# Patient Record
Sex: Female | Born: 1935 | Race: White | Hispanic: No | Marital: Married | Smoking: Former smoker
Health system: Southern US, Community
[De-identification: ages and names within clinical notes are randomized; demographics above are authoritative.]

## PROBLEM LIST (undated history)

## (undated) DIAGNOSIS — K5790 Diverticulosis of intestine, part unspecified, without perforation or abscess without bleeding: Secondary | ICD-10-CM

## (undated) DIAGNOSIS — R51 Headache: Secondary | ICD-10-CM

## (undated) DIAGNOSIS — K209 Esophagitis, unspecified without bleeding: Secondary | ICD-10-CM

## (undated) DIAGNOSIS — M1990 Unspecified osteoarthritis, unspecified site: Secondary | ICD-10-CM

## (undated) DIAGNOSIS — K219 Gastro-esophageal reflux disease without esophagitis: Secondary | ICD-10-CM

## (undated) DIAGNOSIS — H1851 Endothelial corneal dystrophy: Secondary | ICD-10-CM

## (undated) DIAGNOSIS — R911 Solitary pulmonary nodule: Secondary | ICD-10-CM

## (undated) DIAGNOSIS — C801 Malignant (primary) neoplasm, unspecified: Secondary | ICD-10-CM

## (undated) DIAGNOSIS — T7840XA Allergy, unspecified, initial encounter: Secondary | ICD-10-CM

## (undated) DIAGNOSIS — I2510 Atherosclerotic heart disease of native coronary artery without angina pectoris: Secondary | ICD-10-CM

## (undated) DIAGNOSIS — I10 Essential (primary) hypertension: Secondary | ICD-10-CM

## (undated) DIAGNOSIS — D124 Benign neoplasm of descending colon: Secondary | ICD-10-CM

## (undated) DIAGNOSIS — M8580 Other specified disorders of bone density and structure, unspecified site: Secondary | ICD-10-CM

## (undated) DIAGNOSIS — E041 Nontoxic single thyroid nodule: Secondary | ICD-10-CM

## (undated) DIAGNOSIS — H269 Unspecified cataract: Secondary | ICD-10-CM

## (undated) DIAGNOSIS — E785 Hyperlipidemia, unspecified: Secondary | ICD-10-CM

## (undated) DIAGNOSIS — K2970 Gastritis, unspecified, without bleeding: Secondary | ICD-10-CM

## (undated) DIAGNOSIS — D649 Anemia, unspecified: Secondary | ICD-10-CM

## (undated) HISTORY — DX: Atherosclerotic heart disease of native coronary artery without angina pectoris: I25.10

## (undated) HISTORY — DX: Benign neoplasm of descending colon: D12.4

## (undated) HISTORY — DX: Diverticulosis of intestine, part unspecified, without perforation or abscess without bleeding: K57.90

## (undated) HISTORY — DX: Esophagitis, unspecified without bleeding: K20.90

## (undated) HISTORY — DX: Gastritis, unspecified, without bleeding: K29.70

## (undated) HISTORY — DX: Unspecified osteoarthritis, unspecified site: M19.90

## (undated) HISTORY — DX: Essential (primary) hypertension: I10

## (undated) HISTORY — DX: Nontoxic single thyroid nodule: E04.1

## (undated) HISTORY — DX: Other specified disorders of bone density and structure, unspecified site: M85.80

## (undated) HISTORY — DX: Solitary pulmonary nodule: R91.1

## (undated) HISTORY — DX: Unspecified cataract: H26.9

## (undated) HISTORY — DX: Esophagitis, unspecified: K20.9

## (undated) HISTORY — DX: Gastro-esophageal reflux disease without esophagitis: K21.9

## (undated) HISTORY — DX: Malignant (primary) neoplasm, unspecified: C80.1

## (undated) HISTORY — DX: Endothelial corneal dystrophy: H18.51

## (undated) HISTORY — DX: Hyperlipidemia, unspecified: E78.5

## (undated) HISTORY — PX: MENISCUS REPAIR: SHX5179

## (undated) HISTORY — DX: Anemia, unspecified: D64.9

## (undated) HISTORY — PX: COLONOSCOPY: SHX174

## (undated) HISTORY — DX: Allergy, unspecified, initial encounter: T78.40XA

---

## 1995-04-02 HISTORY — PX: FACIAL COSMETIC SURGERY: SHX629

## 1996-12-31 HISTORY — PX: OTHER SURGICAL HISTORY: SHX169

## 1999-06-07 ENCOUNTER — Ambulatory Visit (HOSPITAL_COMMUNITY): Admission: RE | Admit: 1999-06-07 | Discharge: 1999-06-07 | Payer: Self-pay | Admitting: Cardiology

## 1999-06-07 ENCOUNTER — Encounter: Payer: Self-pay | Admitting: Cardiology

## 1999-08-03 DIAGNOSIS — K5790 Diverticulosis of intestine, part unspecified, without perforation or abscess without bleeding: Secondary | ICD-10-CM

## 1999-08-03 DIAGNOSIS — K219 Gastro-esophageal reflux disease without esophagitis: Secondary | ICD-10-CM

## 1999-08-03 DIAGNOSIS — K2970 Gastritis, unspecified, without bleeding: Secondary | ICD-10-CM

## 1999-08-03 HISTORY — DX: Gastritis, unspecified, without bleeding: K29.70

## 1999-08-03 HISTORY — DX: Diverticulosis of intestine, part unspecified, without perforation or abscess without bleeding: K57.90

## 1999-08-03 HISTORY — DX: Gastro-esophageal reflux disease without esophagitis: K21.9

## 1999-09-01 ENCOUNTER — Encounter: Payer: Self-pay | Admitting: Gastroenterology

## 1999-09-01 ENCOUNTER — Ambulatory Visit (HOSPITAL_COMMUNITY): Admission: RE | Admit: 1999-09-01 | Discharge: 1999-09-01 | Payer: Self-pay | Admitting: Gastroenterology

## 2002-08-19 ENCOUNTER — Encounter (HOSPITAL_COMMUNITY): Admission: RE | Admit: 2002-08-19 | Discharge: 2002-09-18 | Payer: Self-pay | Admitting: Cardiology

## 2004-09-06 ENCOUNTER — Ambulatory Visit: Payer: Self-pay

## 2004-09-06 ENCOUNTER — Encounter: Payer: Self-pay | Admitting: Cardiology

## 2004-12-26 ENCOUNTER — Ambulatory Visit (HOSPITAL_COMMUNITY): Admission: RE | Admit: 2004-12-26 | Discharge: 2004-12-26 | Payer: Self-pay | Admitting: Family Medicine

## 2005-08-16 ENCOUNTER — Ambulatory Visit: Payer: Self-pay | Admitting: Cardiology

## 2005-08-28 ENCOUNTER — Ambulatory Visit (HOSPITAL_COMMUNITY): Admission: RE | Admit: 2005-08-28 | Discharge: 2005-08-28 | Payer: Self-pay | Admitting: Cardiology

## 2005-11-27 ENCOUNTER — Ambulatory Visit (HOSPITAL_COMMUNITY): Admission: RE | Admit: 2005-11-27 | Discharge: 2005-11-27 | Payer: Self-pay | Admitting: Family Medicine

## 2006-03-02 HISTORY — PX: CT LUNG SCREENING: HXRAD848

## 2006-03-02 HISTORY — PX: OTHER SURGICAL HISTORY: SHX169

## 2006-03-06 ENCOUNTER — Inpatient Hospital Stay (HOSPITAL_COMMUNITY): Admission: AD | Admit: 2006-03-06 | Discharge: 2006-03-08 | Payer: Self-pay | Admitting: Cardiology

## 2006-03-06 ENCOUNTER — Ambulatory Visit: Payer: Self-pay | Admitting: Cardiology

## 2006-04-19 ENCOUNTER — Ambulatory Visit: Payer: Self-pay | Admitting: Cardiology

## 2006-04-23 ENCOUNTER — Ambulatory Visit (HOSPITAL_COMMUNITY): Admission: RE | Admit: 2006-04-23 | Discharge: 2006-04-23 | Payer: Self-pay | Admitting: Family Medicine

## 2006-06-06 ENCOUNTER — Ambulatory Visit: Payer: Self-pay | Admitting: *Deleted

## 2007-01-30 ENCOUNTER — Ambulatory Visit (HOSPITAL_COMMUNITY): Admission: RE | Admit: 2007-01-30 | Discharge: 2007-01-30 | Payer: Self-pay | Admitting: Family Medicine

## 2007-04-03 ENCOUNTER — Ambulatory Visit: Payer: Self-pay | Admitting: Cardiology

## 2007-05-03 DIAGNOSIS — E041 Nontoxic single thyroid nodule: Secondary | ICD-10-CM

## 2007-05-03 HISTORY — DX: Nontoxic single thyroid nodule: E04.1

## 2007-05-14 ENCOUNTER — Ambulatory Visit (HOSPITAL_COMMUNITY): Admission: RE | Admit: 2007-05-14 | Discharge: 2007-05-14 | Payer: Self-pay | Admitting: Family Medicine

## 2007-06-03 HISTORY — PX: OTHER SURGICAL HISTORY: SHX169

## 2007-07-03 HISTORY — PX: OTHER SURGICAL HISTORY: SHX169

## 2007-10-03 DIAGNOSIS — R911 Solitary pulmonary nodule: Secondary | ICD-10-CM

## 2007-10-03 HISTORY — DX: Solitary pulmonary nodule: R91.1

## 2007-10-07 ENCOUNTER — Ambulatory Visit (HOSPITAL_COMMUNITY): Admission: RE | Admit: 2007-10-07 | Discharge: 2007-10-07 | Payer: Self-pay | Admitting: Family Medicine

## 2008-04-08 ENCOUNTER — Ambulatory Visit: Payer: Self-pay | Admitting: Cardiology

## 2008-04-21 ENCOUNTER — Ambulatory Visit: Payer: Self-pay

## 2009-01-11 DIAGNOSIS — E041 Nontoxic single thyroid nodule: Secondary | ICD-10-CM

## 2009-01-11 DIAGNOSIS — I2510 Atherosclerotic heart disease of native coronary artery without angina pectoris: Secondary | ICD-10-CM

## 2009-01-11 DIAGNOSIS — E785 Hyperlipidemia, unspecified: Secondary | ICD-10-CM

## 2009-01-11 DIAGNOSIS — I10 Essential (primary) hypertension: Secondary | ICD-10-CM | POA: Insufficient documentation

## 2009-01-11 DIAGNOSIS — D649 Anemia, unspecified: Secondary | ICD-10-CM | POA: Insufficient documentation

## 2009-01-11 DIAGNOSIS — I6529 Occlusion and stenosis of unspecified carotid artery: Secondary | ICD-10-CM

## 2009-04-01 DIAGNOSIS — H18519 Endothelial corneal dystrophy, unspecified eye: Secondary | ICD-10-CM

## 2009-04-01 HISTORY — DX: Endothelial corneal dystrophy, unspecified eye: H18.519

## 2009-04-07 ENCOUNTER — Ambulatory Visit: Payer: Self-pay | Admitting: Cardiology

## 2009-06-02 ENCOUNTER — Encounter: Payer: Self-pay | Admitting: Cardiology

## 2009-11-03 ENCOUNTER — Encounter: Payer: Self-pay | Admitting: Cardiology

## 2010-03-22 ENCOUNTER — Encounter: Payer: Self-pay | Admitting: Cardiology

## 2010-05-02 ENCOUNTER — Encounter: Payer: Self-pay | Admitting: Cardiology

## 2010-05-03 ENCOUNTER — Encounter: Payer: Self-pay | Admitting: Cardiology

## 2010-05-03 ENCOUNTER — Ambulatory Visit: Payer: Self-pay

## 2010-05-11 ENCOUNTER — Ambulatory Visit: Payer: Self-pay | Admitting: Cardiology

## 2010-10-23 ENCOUNTER — Encounter: Payer: Self-pay | Admitting: Cardiology

## 2010-10-23 ENCOUNTER — Encounter: Payer: Self-pay | Admitting: Family Medicine

## 2010-11-01 NOTE — Miscellaneous (Signed)
Summary: Orders Update  Clinical Lists Changes  Orders: Added new Test order of Carotid Duplex (Carotid Duplex) - Signed 

## 2010-11-01 NOTE — Miscellaneous (Signed)
Summary: Orders Update  Clinical Lists Changes  Orders: Added new Service order of EKG w/ Interpretation (93000) - Signed Observations: Added new observation of PI CARDIO: Your physician recommends that you schedule a follow-up appointment in: 1 yr in the [place] office Your physician recommends that you continue on your current medications as directed. Please refer to the Current Medication list given to you today. (05/11/2010 11:03)      Patient Instructions: 1)  Your physician recommends that you schedule a follow-up appointment in: 1 yr in the [place] office 2)  Your physician recommends that you continue on your current medications as directed. Please refer to the Current Medication list given to you today.

## 2010-11-01 NOTE — Assessment & Plan Note (Signed)
Summary: [facility]   Visit Type:  Follow-up Primary Provider:  Dr. Vernon Prey  CC:  CAD.  History of Present Illness: The patient presents for yearly followup. Since I last saw her she has had no new cardiovascular problems. She denies any chest pressure, neck or arm discomfort. She has no palpitations, presyncope or syncope. She has no PND or ortho She has no swelling or edema. She ambulates regularly for exercise.  Current Medications (verified): 1)  Lovaza 1 Gm Caps (Omega-3-Acid Ethyl Esters) .... 3 By Mouth Daily 2)  Hydrochlorothiazide 12.5 Mg Caps (Hydrochlorothiazide) .Marland Kitchen.. 1 By Mouth Daily 3)  Protonix 40 Mg Tbec (Pantoprazole Sodium) .Marland Kitchen.. 1 By Mouth Daily 4)  Pravastatin Sodium 40 Mg Tabs (Pravastatin Sodium) .... 2 By Mouth Daily 5)  Welchol 625 Mg Tabs (Colesevelam Hcl) .... 3 By Mouth Daily 6)  Actonel 150 Mg Tabs (Risedronate Sodium) .Marland Kitchen.. 1 By Mouth Monthly 7)  Eql Coq10 200 Mg Caps (Coenzyme Q10) .Marland Kitchen.. 1 By Mouth Daily 8)  Aspirin 81 Mg  Tabs (Aspirin) .Marland Kitchen.. 1 By Mouth Daily 9)  Calcium Citrate 950 Mg Tabs (Calcium Citrate) .... 2 By Mouth Daily 10)  Multivitamins   Tabs (Multiple Vitamin) .Marland Kitchen.. 1 By Mouth Daily 11)  Ra Iron 27 Mg Tabs (Ferrous Sulfate) .Marland Kitchen.. 1 By Mouth Daily 12)  Vitamin D3 1000 Unit Tabs (Cholecalciferol) .... 2 By Mouth Daily  Allergies (verified): 1)  ! Sulfa  Past History:  Past Medical History: Coronary artery disease (80% LAD stenosis, found in 1998, subsequently stented.  Catheterization June 2006 demonstrated normal EF.  The stent was widely patent, there was a 50% proximal LAD and 40% before the stent.  The remainder of her vessels were free of any disease), hypertension, mild carotid stenosis with 39% stenosis on the right and 40- 59% stenosis on the left, dyslipidemic, anemia, thyroid nodules.   Review of Systems       As stated in the HPI and negative for all other systems.   Vital Signs:  Patient profile:   76 year old  female Height:      62 inches Weight:      116 pounds BMI:     21.29 Pulse rate:   68 / minute Resp:     16 per minute BP sitting:   108 / 62  (right arm)  Vitals Entered By: Marrion Coy, CNA (May 11, 2010 10:11 AM)  Physical Exam  General:  Well developed, well nourished, in no acute distress. Head:  normocephalic and atraumatic Eyes:  PERRLA/EOM intact; conjunctiva and lids normal. Mouth:  Teeth, gums and palate normal. Oral mucosa normal. Neck:  Neck supple, no JVD. No masses, thyromegaly or abnormal cervical nodes. Chest Wall:  no deformities or breast masses noted Lungs:  Clear bilaterally to auscultation and percussion. Heart:  Non-displaced PMI, chest non-tender; regular rate and rhythm, S1, S2 without murmurs, rubs or gallops. Carotid upstroke normal, no bruit. Normal abdominal aortic size, no bruits. Femorals normal pulses, no bruits. Pedals normal pulses. No edema, no varicosities. Abdomen:  Bowel sounds positive; abdomen soft and non-tender without masses, organomegaly, or hernias noted. No hepatosplenomegaly. Msk:  Back normal, normal gait. Muscle strength and tone normal. Pulses:  pulses normal in all 4 extremities Extremities:  No clubbing or cyanosis. Neurologic:  Alert and oriented x 3. Skin:  Intact without lesions or rashes. Cervical Nodes:  no significant adenopathy Axillary Nodes:  no significant adenopathy Inguinal Nodes:  no significant adenopathy Psych:  Normal affect.  EKG  Procedure date:  05/11/2010  Findings:      Sinus rhythm, rate 68, low voltage, no acute ST-T wave changes  Impression & Recommendations:  Problem # 1:  CAROTID STENOSIS (ICD-433.10) She had a carotid Doppler a few days ago which demonstrated stable disease less than 60% on the left and less than 40% on the right. She will be reevaluated in 2 years.  Problem # 2:  CAD, UNSPECIFIED SITE (ICD-414.00) She is very active and has absolutely no symptoms. Her last catheterization  was 2007. No further testing is suggested.  Problem # 3:  HYPERTENSION, UNSPECIFIED (ICD-401.9) Her blood pressure is controlled. She will continue the meds as listed.  Problem # 4:  HYPERLIPIDEMIA-MIXED (ICD-272.4) Her lipids are followed and managed expertly by Dr. Christell Constant. I reviewed these and she has an excellent LDL/HDL ratio.

## 2010-12-26 ENCOUNTER — Encounter: Payer: Self-pay | Admitting: Cardiology

## 2010-12-27 ENCOUNTER — Telehealth: Payer: Self-pay | Admitting: Cardiology

## 2010-12-27 NOTE — Telephone Encounter (Signed)
LOV,12 lead faxed to Denise[facility] @ [phone] 12/27/10/KM

## 2010-12-30 ENCOUNTER — Ambulatory Visit (HOSPITAL_BASED_OUTPATIENT_CLINIC_OR_DEPARTMENT_OTHER)
Admission: RE | Admit: 2010-12-30 | Discharge: 2010-12-30 | Disposition: A | Discharge status: Home / Self Care | Payer: Medicare Other | Source: Ambulatory Visit | Attending: Orthopedic Surgery | Admitting: Orthopedic Surgery

## 2010-12-30 DIAGNOSIS — Z01810 Encounter for preprocedural cardiovascular examination: Secondary | ICD-10-CM | POA: Insufficient documentation

## 2010-12-30 DIAGNOSIS — IMO0002 Reserved for concepts with insufficient information to code with codable children: Secondary | ICD-10-CM | POA: Insufficient documentation

## 2010-12-30 DIAGNOSIS — R9431 Abnormal electrocardiogram [ECG] [EKG]: Secondary | ICD-10-CM | POA: Insufficient documentation

## 2010-12-30 DIAGNOSIS — Z01812 Encounter for preprocedural laboratory examination: Secondary | ICD-10-CM | POA: Insufficient documentation

## 2010-12-30 DIAGNOSIS — X58XXXA Exposure to other specified factors, initial encounter: Secondary | ICD-10-CM | POA: Insufficient documentation

## 2010-12-30 LAB — POCT I-STAT 4, (NA,K, GLUC, HGB,HCT)
Glucose, Bld: 79 mg/dL (ref 70–99)
Sodium: 142 mEq/L (ref 135–145)

## 2011-01-04 NOTE — Op Note (Signed)
  [name], [name]                ACCOUNT NO.:  [id number]  MEDICAL RECORD NO.:  [id number]          PATIENT TYPE:  AMB  LOCATION:                               FACILITY:  [facility]  PHYSICIAN:  Ollen Gross, M.D.    DATE OF BIRTH:  04/10/1935  DATE OF PROCEDURE:  12/30/2010 DATE OF DISCHARGE:                              OPERATIVE REPORT   PREOPERATIVE DIAGNOSIS:  Left knee medial meniscal tear.  POSTOPERATIVE DIAGNOSIS:  Left knee medial meniscal tear.  PROCEDURE:  Left knee arthroscopy with meniscal debridement.  SURGEON:  Ollen Gross, MD  ASSISTANT:  None.  ANESTHESIA:  General.  ESTIMATED BLOOD LOSS:  Minimal.  DRAINS:  None.  COMPLICATIONS:  None.  CONDITION:  Stable to Recovery.  BRIEF CLINICAL NOTE:  Ms. Yolanda [name] is a 76 year old female with several- month history of significant left knee medial sided pain with mechanical symptoms.  Exam and history suggested a medial meniscal tear confirmed by MRI.  She presents for arthroscopy and debridement.  PROCEDURE IN DETAIL:  After successful administration of general anesthetic, a tourniquet was placed high on the left thigh and left lower extremity was prepped and draped in the usual sterile fashion. Standard superomedial and inferolateral incisions were made.  Inflow cannula passed.  Superomedial camera passed inferolateral.  Arthroscopic visualization proceeded.  Undersurface of patella and trochlea looked normal.  No evidence of any cartilage defects or chondromalacia.  Medial and lateral gutters were visualized.  There were no loose bodies. Flexion valgus force applied to the knee.  The medial compartment was entered.  She had a significant tear in the body and posterior horn of the medial meniscus, which appeared unstable.  Spinal needle was used to localize the inferomedial portal.  Small incision made and dilator placed.  The meniscus was debrided back to stable base with baskets and a 4.2-mm shaver and then  sealed off with the ArthroCare device.  It was probed and found to be stable.  Chondral surfaces showed minimal chondromalacia.  No evidence of any full-thickness chondral defects. Intercondylar notch was visualized.  The ACL was normal.  Lateral compartment was entered.  It looked normal.  Joints again was visualized and no other tears, loose bodies, or defects were noted.  Arthroscopic equipments were removed from the inferior portals, which were closed with interrupted 4-0 nylon.  20 cc of 0.25% Marcaine with epi injected through the inflow cannula and that was removed and that portal closed with nylon.  Incision was cleaned and dried and a bulky sterile dressing applied.  She was then awakened and transported to Recovery in stable condition.     Ollen Gross, M.D.     FA/MEDQ  D:  12/30/2010  T:  12/30/2010  Job:  [id number]  Electronically Signed by Ollen Gross M.D. on 01/04/2011 09:53:16 AM

## 2011-02-07 ENCOUNTER — Ambulatory Visit: Payer: Medicare Other | Attending: Orthopedic Surgery | Admitting: Physical Therapy

## 2011-02-07 DIAGNOSIS — IMO0001 Reserved for inherently not codable concepts without codable children: Secondary | ICD-10-CM | POA: Insufficient documentation

## 2011-02-07 DIAGNOSIS — M25569 Pain in unspecified knee: Secondary | ICD-10-CM | POA: Insufficient documentation

## 2011-02-07 DIAGNOSIS — R5381 Other malaise: Secondary | ICD-10-CM | POA: Insufficient documentation

## 2011-02-09 ENCOUNTER — Ambulatory Visit: Payer: Medicare Other | Admitting: Physical Therapy

## 2011-02-09 ENCOUNTER — Encounter: Payer: Self-pay | Admitting: Family Medicine

## 2011-02-14 ENCOUNTER — Ambulatory Visit: Payer: Medicare Other | Admitting: Physical Therapy

## 2011-02-14 NOTE — Assessment & Plan Note (Signed)
[facility]                            CARDIOLOGY OFFICE NOTE   Yolanda [name], Yolanda [name]                       MRN:          [id number]  DATE:04/03/2007                            DOB:          12/01/1934    PRIMARY CARE PHYSICIAN:  Ernestina Penna, M.D.   REASON FOR PRESENTATION:  Evaluate patient with coronary disease.   HISTORY OF PRESENT ILLNESS:  The patient presents for yearly followup.  She is now 76 years old. She has had no problems since I last saw her.  She denies any chest pressure, neck discomfort, arm discomfort, activity  induced nausea, vomiting or excessive diaphoresis. She gets aerobic  exercise and uses light weights. We have been following an abnormal  chest nodule. She had a PET scan recently which demonstrated no  metabolic activity.   PAST MEDICAL HISTORY:  1. Coronary artery disease (80% left anterior descending artery (LAD)      stenosis found in 1998 and subsequently stented. Catheterization in      June 2006, demonstrated normal ejection fraction, stent was widely      patent. There was 50% proximal left anterior descending artery      (LAD) and 40% stenosis before the stent. The remainder of the      vessels were free of disease).  2. Hypertension.  3. Mild carotid stenosis followed by Vascular Surgery (39% on the      right and 40-59% on the left).  4. Dyslipidemia.  5. Anemia.  6. Thyroid nodules.   ALLERGIES:  SULFA.   MEDICATIONS:  1. Hydrochlorothiazide 12.5 mg daily.  2. Nexium 40 mg daily.  3. Forteo.  4. Co-enzyme Q 200 mg daily.  5. Aspirin 81 mg daily.  6. Centrum Silver.  7. Citrucel.  8. Fish oil.  9. Iron 27 mg every other day.  10.Welchol.  11.Klor-Con 10 mEq daily.  12.Pravastatin 40 mg daily.   REVIEW OF SYSTEMS:  As stated in the HPI and otherwise negative for  other systems.   PHYSICAL EXAMINATION:  The patient is in no distress. Blood pressure  128/70, heart rate 73 and regular, weight  119 pounds. Body Mass Index is  21.  HEENT: Eyelids unremarkable. Pupils equal, round, and reactive to light.  Fundi not visualized. Oral mucosa is unremarkable.  NECK: No jugular venous distention at 45 degrees.  Carotid upstroke  brisk and symmetrical. Soft right carotid bruit. No thyromegaly.  LYMPHATICS: No cervical, axillary or inguinal adenopathy.  LUNGS: Clear to auscultation bilaterally.  BACK: No costovertebral angle tenderness.  CHEST: Unremarkable.  HEART: PMI not displaced or sustained. S1, S2 within normal limits. No  S3. No S4. No clicks, rub or murmurs.  ABDOMEN: Flat, positive bowel sounds, normal in frequency and pitch. No  bruits. No rebound. No guarding. No midline pulsatile mass. No  hepatomegaly, splenomegaly.  SKIN: No rashes, no nodules.  EXTREMITIES: 2+ pulses throughout. No edema. No cyanosis or clubbing.  NEURO: Oriented to person, place and time. Cranial nerves II-XII grossly  intact. Motor grossly intact throughout.   EKG: Sinus rhythm, rate 73, axis  within normal limits, intervals within  normal limits. No acute ST-T wave changes.   ASSESSMENT/PLAN:  1. Coronary disease: The patient is having no symptoms related to      this. No further cardiovascular testing is suggested.  2. Carotid stenosis: She was told by Vascular Surgery not to come back      for Doppler. That strikes me as odd given the degree of stenosis      that she had. I have discussed with her repeating this in one year      and I will arrange that.  3. Lung nodules: The patient does have lung nodules without any active      PET. It was suggested on the last CT that she have this followed      up. I have suggested 9 months. She would like Dr.  Christell Constant to order      and follow this. I will therefore defer to his management      appropriately.  4. Risk reduction: The patient is having excellent followup of her      lipids. She has a well-controlled blood pressure. No further      evaluation  is warranted and she will follow with Dr.  Christell Constant.  5. Followup: In 12 months.     Rollene Rotunda, MD, [facility]  Electronically Signed    JH/MedQ  DD: 04/03/2007  DT: 04/03/2007  Job #: [id number]   cc:   Ernestina Penna, M.D.

## 2011-02-14 NOTE — Assessment & Plan Note (Signed)
[facility]                            CARDIOLOGY OFFICE NOTE   NAME:Berni, KARLE DESROSIER                       MRN:          [id number]  DATE:04/08/2008                            DOB:          1935-02-01    PRIMARY CARE PHYSICIAN:  Ernestina Penna, MD   REASON FOR PRESENTATION:  Evaluate the patient with coronary artery  disease.   HISTORY OF PRESENT ILLNESS:  The patient presents for yearly followup.  She is now 76 years old.  She has done quite well since I last saw her.  She has had no chest discomfort, neck or arm discomfort.  She has had no  palpitations, presyncope, or syncope.  She denies any shortness of  breath and has no PND or orthopnea.  She exercises walking 45 minutes  every day.  She has had her lipids followed.  She has good blood  pressure control.  She has had some lung nodules followed with her last  CT being in January.  These are stable.  Mediastinal nodes and stable  bilateral lung nodules.  She was noted to have some calcification of her  LAD on this CAT scan.   PAST MEDICAL HISTORY:  1. Coronary artery disease (80% LAD stenosis found in 1998 and      subsequently stented.  Catheterization last in 2007 demonstrated      proximal 50% followed by 40% stenosis followed by less than 10%      stenosis in the stent.  The EF was 60%.  Circumflex and RCA were      free of high-grade disease).  2. Hypertension.  3. Mild carotid stenosis (39% on the right and 40-59% on the left).  4. Dyslipidemia.  5. Anemia.  6. Thyroid nodules.   ALLERGIES:  SULFA.   MEDICATIONS:  1. Hydrochlorothiazide 12.5 mg daily.  2. Nexium 40 mg daily.  3. Coenzyme Q.  4. Aspirin 81 mg daily.  5. Centrum Silver.  6. Citracal.  7. Fish oil.  8. Iron.  9. Welchol.  10.Pravastatin 40 mg daily.  11.Actonel.  12.Astelin.   REVIEW OF SYSTEMS:  As stated in the HPI and otherwise negative for  other systems.   PHYSICAL EXAMINATION:  GENERAL:  The  patient is in no distress.  VITAL SIGNS:  Blood pressure 124/70, heart rate 60 and regular.  HEENT:  Eyelids unremarkable, pupils equal round and reactive to light,  fundi not visualized, oral mucosa unremarkable.  NECK:  No jugular venous distention at 45 degrees, carotid upstroke  brisk and symmetric, soft carotid bruit on the right, positive  thyromegaly.  Lymphatics:  No cervical, axillary, or inguinal adenopathy.  LUNGS:  Clear to auscultation bilaterally.  BACK:  No costovertebral angle mass.  CHEST:  Unremarkable.  HEART:  PMI not displaced or sustained, S1 and S2 within normals, no S3,  no S4, no clicks, no rubs, no murmurs.  ABDOMEN:  Flat, positive bowel sounds, normal in frequency and pitch, no  bruits, no rebound, no guarding, no midline pulsatile mass, no  organomegaly.  SKIN:  No rashes, no nodules.  EXTREMITIES:  Pulse 2+ throughout, no cyanosis, no clubbing, no edema.  NEUROLOGIC:  Oriented to person, place, and time, cranial nerves II-XII  grossly intact, motor grossly intact throughout.   EKG, sinus rhythm, rate 68, axes within normal limits, intervals within  normal limits, no acute ST-T wave changes.   ASSESSMENT/PLAN:  1. Coronary disease.  The patient has coronary disease as described.      She has had no ongoing symptoms.  No further cardiovascular testing      is suggested.  She will continue with aggressive risk reduction.  2. Carotid stenosis.  She will be scheduled for carotid Dopplers, as      it has been about a year.  Further evaluation will be based on      these results.  3. Dyslipidemia.  She has an LDL of 101, but HDL of greater than 70.      Therefore, she will continue on the regimen as listed.  4. Hypertension.  Blood pressure is well controlled.  She will      continue her medications as listed.  5. Thyroid and pulmonary nodules.  These are followed by Dr. Christell Constant.  6. Followup.  I will see the patient back in 1 year or sooner if       needed.     Rollene Rotunda, MD, [facility]  Electronically Signed    JH/MedQ  DD: 04/08/2008  DT: 04/09/2008  Job #: [id number]   cc:   Ernestina Penna, M.D.

## 2011-02-14 NOTE — Assessment & Plan Note (Signed)
[facility]                            CARDIOLOGY OFFICE NOTE   ANDREA, FERRER                       MRN:          [id number]  DATE:04/07/2009                            DOB:          05-17-1935    PRIMARY CARE PHYSICIAN:  Nyra Capes, MD   REASON FOR PRESENTATION:  Evaluate the patient with coronary artery  disease.   HISTORY OF PRESENT ILLNESS:  The patient returns for yearly followup.  Since I last saw her, she has done well.  She still walks a mile an hour  every day.  With that, she denies any chest discomfort, neck or arm  discomfort.  She has no shortness of breath, PND or orthopnea.  She had  no palpitations, presyncope or syncope.  Of note, she was suppose to  have carotid Dopplers done earlier this month.  However, she declined as  the carotid stenosis has not changed in the last 2 appointments.   PAST MEDICAL HISTORY:  Coronary artery disease (80% LAD stenosis found  in 1998 and subsequently stented.  Catheterization in 2007 demonstrated  proximal 50% followed by 40% stenosis followed by less than 10% stenosis  in the stent.  The EF was 60%.  The circumflex and right coronary were  free of high-grade disease.), hypertension, mild carotid stenosis (less  than 30% on the right and 40-59% on the left), dyslipidemia, anemia,  thyroid nodules, pulmonary nodules.  (She had an CT in January 2009  suggesting no further therapy was warranted).   ALLERGIES:  SULFA.   MEDICATIONS:  1. Hydrochlorothiazide 12.5 mg daily.  2. Coenzyme Q.  3. Aspirin 81 mg daily.  4. Centrum Silver.  5. Citracal.  6. Fish oil.  7. Iron.  8. Welchol.  9. Pravastatin 40 mg daily.  10.Actonel 150 mg every month.  11.Pantoprazole 40 mg daily.   REVIEW OF SYSTEMS:  As stated in the HPI and otherwise negative for all  other systems.   PHYSICAL EXAMINATION:  GENERAL:  The patient is pleasant in no distress.  VITAL SIGNS:  Blood pressure 148/78, heart rate  59 and regular, weight  120 pounds.  HEENT:  Eyes are unremarkable, pupils are equal, round and reactive to  light, fundi not visualized, oral mucosa unremarkable.  NECK:  No  jugular venous distention at 45 degrees, carotid upstroke brisk and  symmetric, no bruits, no thyromegaly.  LYMPHATICS:  No cervical, axillary, or inguinal adenopathy.  LUNGS:  Clear to auscultation bilaterally.  BACK:  No costovertebral tenderness.  CHEST:  Unremarkable.  HEART:  PMI not displaced or sustained, S1 and S2 within normal limits,  no S3, no S4, no clicks, no rubs, no murmurs.  ABDOMEN:  Flat, positive  bowel sounds.  Normal in frequency and pitch, no bruits, no rebound, no  guarding, no midline pulsatile mass, no hepatomegaly, no splenomegaly.  SKIN:  No rashes, no nodules.  EXTREMITIES:  Pulses are 2+, no edema.   EKG; sinus bradycardia, rate 59, axis rightward, intervals within normal  limits, no acute ST-T wave changes.   ASSESSMENT AND PLAN:  1.  Coronary artery disease.  The patient has no new symptoms.  No      further cardiovascular testing is suggested.  She will continue      with risk reduction.  2. Carotid stenosis.  I agree that she does not need a Doppler done      this year.  However, I would repeat this in 2011 and told her that      this is something that should be followed to prevent stroke.  3. Dyslipidemia.  I reviewed her lipid profile and it is acceptable.      She will continue with the meds as listed.  4. Hypertension.  Blood pressure is well controlled.  She will      continue the meds as listed.  5. Followup.  I will see her back in 1 year or sooner if needed.      Rollene Rotunda, MD, [facility]  Electronically Signed    JH/MedQ  DD: 04/07/2009  DT: 04/08/2009  Job #: [id number]   cc:   Ernestina Penna, M.D.

## 2011-02-16 ENCOUNTER — Ambulatory Visit: Payer: Medicare Other | Admitting: Physical Therapy

## 2011-02-17 NOTE — H&P (Signed)
NAMEBERTINE, SCHLOTTMAN NO.:  [id number]   MEDICAL RECORD NO.:  [id number]           PATIENT TYPE:   LOCATION:                                 FACILITY:   PHYSICIAN:  Charlies Constable, M.D. [facility] DATE OF BIRTH:  10-14-1934   DATE OF ADMISSION:  DATE OF DISCHARGE:                                HISTORY & PHYSICAL   This is a patient of Dr. Vernon Prey and Dr. Rollene Rotunda.   CHIEF COMPLAINT:  Chest pain.   HISTORY OF PRESENT ILLNESS:  This is a 76 year old married white female  patient with history of coronary artery disease status post stenting of an  80% LAD in 1998.  She has done well since that time, but Friday developed  chest tightness radiating into her neck and down her right arm.  This occurs  with very little activity such as tying her shoes or walking stairs.  The  only rest pain she had was last night when she went to bed after she took  some medications and she could not distinguish if it was indigestion versus  cardiac.  She said it was a little bit lower than her recent chest pain.  This eases when she stops exerting herself.  She has no dizziness or  presyncope with this.  When she had her stent back in 1998 her main symptoms  were decreased exercise tolerance rather than chest pain.  It should be  noted that she suffers some type of viral illness over the past three weeks  and has been treated by Dr. Vernon Prey.  She had fever as high as 105 with  chills and severe weakness.  Her LFTs became elevated and her Pravachol was  stopped.  A lipid profile showed LDLP of greater than 2000 and a small LDLP  of greater than 1200 putting her at very high risk.  Total cholesterol was  still 128, triglycerides 62, LDL calculated 92, HDL calculated 24.  The  patient was placed on three antibiotics and has eventually gotten better,  but is still weak from this virus.  Dr. Antoine Poche saw her back in November at  which time she was doing well and walking three miles a day  without  symptoms.  She has not had an exercise stress test in several years.   CURRENT MEDICATIONS:  1.  Hydrochlorothiazide 12.5 mg daily.  2.  Nexium 40 mg daily.  3.  Forteo daily.  4.  Co-Q 10 200 mg daily.  5.  Aspirin 81 mg daily.  6.  Centrum Silver daily.  7.  Citrucel 500 mg b.i.d.  8.  Fish oil 1200 mg daily.  9.  Iron 27 mg every other day.   PAST MEDICAL HISTORY:  1.  Significant for hypertension.  2.  She does have a thyroid nodule, but borderline thyroid that is being      followed by Dr. Christell Constant and currently untreated.  3.  She had a facelift in 1996.  No other surgeries.  4.  No history of diabetes or peptic ulcer disease.   SOCIAL HISTORY:  She is married.  She has one child who is alive and well.  She is retired.  She has never smoked.   FAMILY HISTORY:  Please see old records.   REVIEW OF SYSTEMS:  Negative for dizziness or presyncopal signs or symptoms,  dyspepsia, dysphagia, nausea, vomiting, change in bowels, or melena.  She  did have a recent viral illness with high fevers, chills, and severe  weakness which she is gradually recovering from.   PHYSICAL EXAMINATION:  GENERAL:  This is a pleasant 76 year old white female  in no acute distress.  VITAL SIGNS:  Blood pressure 131/80, pulse 76, weight 114.  HEENT:  Head is normocephalic without sign of trauma.  Extraocular movements  are intact.  Pupils are equal and reactive to light, accommodation.  Nasal  mucosa is moist.  Throat is without erythema or exudate.  NECK:  Without JVD, bruit, or thyroid enlargement.  LUNGS:  Clear anterior, posterior, and lateral.  HEART:  Regular rate and rhythm at 76 beats per minute.  Normal S1 and S2.  No significant murmur, rub, bruit, thrill, or heave noted.  ABDOMEN:  Soft without organomegaly, masses, lesions, or abnormal  tenderness.  EXTREMITIES:  Without clubbing, cyanosis, edema.  She has good distal  pulses.  NEUROLOGIC:  Without focal deficit.   EKG:   Normal sinus rhythm with poor R-wave progression.  No acute EKG  changes from prior tracings.   IMPRESSION:  1.  Unstable angina.  2.  Coronary artery disease status post stenting of the left anterior      descending in 1998.  3.  Mild carotid stenosis less than 39% on the right, 40-59 on the left.  4.  Dyslipidemia.  5.  History of anemia.  6.  Recent viral illness with fever, chills, and increased LFTs, still      resolving.   PLAN:  Patient was seen by myself and Dr. Juanda Chance and we feel she needs to be  admitted to the hospital today and undergo cardiac catheterization today if  possible, if not, tomorrow.  Patient is agreeable.      Jacolyn Reedy, P.A. [facility]    ______________________________  Charlies Constable, M.D. [facility]    ML/MEDQ  D:  03/06/2006  T:  03/06/2006  Job:  [id number]   cc:   Ernestina Penna, M.D.  Fax: [phone]

## 2011-02-17 NOTE — Assessment & Plan Note (Signed)
[facility]                              CARDIOLOGY OFFICE NOTE   RAEGYN, Yolanda [name]                       MRN:          [id number]  DATE:04/19/2006                            DOB:          06/16/1935    PRIMARY CARE PHYSICIAN:  Ernestina Penna, MD   REASON FOR PRESENTATION:  Patient with coronary artery disease.   HISTORY OF PRESENT ILLNESS:  The patient is a pleasant 76 year old with  coronary disease, as described below.  She was hospitalized in June with  chest pain.  She had a cardiac catheterization demonstrating a patent stent.  She also had a CT of her chest demonstrating no pulmonary emboli.  However,  she did have some enlarged lymph nodes.  She had some mildly elevated liver  enzymes, and was hypokalemic, which was replaced.   Since discharge, she has done well.  She has had followup of some thyroid  nodules.  She denies any chest pressure, neck discomfort, arm discomfort,  activity-induced nausea, vomiting, excessive diaphoresis.  She has had no  palpitations, syncope or presyncope.  She denies any PND or orthopnea.   PAST MEDICAL HISTORY:  Coronary artery disease (80% LAD stenosis, found in  1998, subsequently stented.  Catheterization June 2006 demonstrated normal  EF.  The stent was widely patent, there was a 50% proximal LAD and 40%  before the stent.  The remainder of her vessels were free of any disease),  hypertension, mild carotid stenosis with 39% stenosis on the right and 40-  59% stenosis on the left, dyslipidemic, anemia, thyroid nodules.   ALLERGIES:  SULFA.   MEDICATIONS:  1.  Hydrochlorothiazide 12.5 mg.  2.  Nexium 40 mg daily.  3.  Forteo.  4.  Coenzyme Q.  5.  Aspirin 81 mg daily.  6.  Centrum Silver.  7.  Citracal.  8.  Fish oil.  9.  Iron.   REVIEW OF SYSTEMS:  As stated in the HPI, and otherwise negative other  systems.   PHYSICAL EXAMINATION:  The patient is in no distress.  Blood pressure  128/77,  heart rate 71 and regular, weight 112 pounds.  HEENT:  Eyes unremarkable. Eyelids unremarkable.  Pupils equal, round and  reactive to light.  Fundi are not visualized.  Normal oral mucosa.  NECK:  No jugular venous distention.  Wave form within normal limits.  Carotid upstrokes brisk and symmetric, no bruits, no thyromegaly.  LYMPHATICS:  No cervical, axillary or inguinal adenopathy.  LUNGS:  Clear to auscultation bilaterally.  BACK:  No costovertebral angle tenderness.  CHEST:  Unremarkable.  HEART:  PMI nondisplaced or sustained.  S1 and S2 within normal limits. No  S3, no S4, no murmurs.  ABDOMEN:  Flat, positive bowel sounds, normal in frequency and pitch.  No  bruits, no rebound, no guarding.  No midline pulse.  No mass, organomegaly,  splenomegaly.  SKIN:  No rashes  EXTREMITIES:  2+ pulses, no edema.   EKG:  Sinus rhythm, rate of 69, axis within normal limits, intervals within  normal limits, no acute ST-T wave changes.  ASSESSMENT AND PLAN:  1.  Coronary disease.  The patient has nonobstructive residual coronary      disease and a patent stent.  She will continue with secondary risk      reduction.  No further evaluation is warranted at this point.  2.  Dyslipidemia.  She did not tolerate Lipitor, and said that she had      muscle aches with that.  I am going to take the liberty of starting her      on Pravastatin, which she was on before.  I am sure she will be tolerant      of this.  I will start 40 mg, and this can be followed by Dr. Christell Constant,      going forward.  3.  Adenopathy.  I will schedule a CT in September, which was the suggested      followup.  4.  Thyroid nodules.  Being followed by Dr. Christell Constant and one of the surgeons in      town.  5.  Hypokalemia and elevated liver enzymes.  I will check a CMET today.  6.  Followup.  I will see her back in 1 year, or sooner if needed.                               Rollene Rotunda, MD, [facility]    JH/MedQ  DD:  04/19/2006  DT:   04/20/2006  Job #:  [id number]   cc:   Ernestina Penna, MD

## 2011-02-17 NOTE — Cardiovascular Report (Signed)
Yolanda [name], Yolanda [name]                ACCOUNT NO.:  [id number]   MEDICAL RECORD NO.:  [id number]          PATIENT TYPE:  INP   LOCATION:  2024                         FACILITY:  [facility]   PHYSICIAN:  Charlies Constable, M.D. [facility] DATE OF BIRTH:  1934/11/21   DATE OF PROCEDURE:  03/07/2006  DATE OF DISCHARGE:  11/27/2005                              CARDIAC CATHETERIZATION   HISTORY:  Yolanda [name] is 76 years old and has had a previous Crown stent  placed in the LAD in 1998.  Recently, about 3-4 weeks ago, she developed  fever up to 105 degrees, and chills, and weakness, and abnormal liver  function tests.  Her Pravachol was stopped and she was treated with  antibiotics and improved, although was left with persistent weakness.  Over  the last 2 days, she developed chest tightness radiating to the neck and her  arm with exertion.  She was seen in the office by Wende Bushy and myself,  and we admitted her to the hospital with a diagnosis of crescendo angina.  Her troponin returned abnormal at 0.1.   PROCEDURE:  The procedure was performed by the right femoral artery, and  arterial sheath and 6-French __________ coronary catheters.  A femoral  arterial punch was performed and Omnipaque contrast was used.  The patient  tolerated the procedure well and left the laboratory in satisfactory  condition.  Right femoral artery was not closed because the artery was very  superficial.   RESULTS:  The left main coronary artery was free of disease.   Left anterior descending artery was irregular and gave rise to three sets of  perforators and three diagonal branches.  There was 50 and 40% tandem  stenoses in the proximal LAD, not to far from the ostium.  The Crown stent  in the proximal LAD, which was located after these tandem lesions, had less  than 10% stenosis.  Distal vessel is free of major obstruction.   The circumflex artery gave rise to two small marginal branches and three  posterolateral  branches.  It also gave rise to two atrial branches.  These  vessels were free of significant disease.   The right coronary artery was a moderate-sized vessel that gave rise to a  ventricle branch, a posterior descending branch and three posterolateral  branches.  These vessels were free of disease.   The left ventriculogram from the RAO projection showed good wall motion with  no areas of hypokinesis.  The estimated ejection fraction was 60%.   HEMODYNAMIC DATA:  The aortic pressure was 116/57 with a mean of 82, and  left ventricular pressure was 116/19.   CONCLUSION:  Nonobstructive coronary artery disease with 50 and 40% stenoses  in the proximal LAD, less than 10% stenosis at the stent site in the  proximal LAD, no significant obstruction in the circumflex and right  coronary arteries, and normal LV function.   RECOMMENDATIONS:  Reassurance.  In view of these findings, I am not certain  about the etiology of the patient's recent exertional symptoms and abnormal  troponin.  It is possible that  this is somehow related to a recent  viral infection.  Her O2 sat on room air was 97%.  We will get a D-dimer to  rule out the remote possibility of a pulmonary embolism.  If this is  negative we will probably let her go home today and follow her to see if she  has continued symptoms.           ______________________________  Charlies Constable, M.D. [facility]     BB/MEDQ  D:  03/07/2006  T:  03/07/2006  Job:  [id number]   cc:   Ernestina Penna, M.D.  Fax: [phone]   Charlies Constable, M.D. [facility]  [address]  [address]  [place]  [place] [address]   Rollene Rotunda, M.D.  [address]  [address]  [place]  [place] [address]

## 2011-02-17 NOTE — Discharge Summary (Signed)
Yolanda [name], Yolanda [name]                ACCOUNT NO.:  [id number]   MEDICAL RECORD NO.:  [id number]          PATIENT TYPE:  INP   LOCATION:  2024                         FACILITY:  [facility]   PHYSICIAN:  Rollene Rotunda, M.D.   DATE OF BIRTH:  [birth year]   DATE OF ADMISSION:  03/06/2006  DATE OF DISCHARGE:  03/08/2006                                 DISCHARGE SUMMARY   DISCHARGING DIAGNOSES:  1.  Noncardiac chest pain, status post cardiac catheterization on March 07, 2006.  Patient with a normal ejection fraction.  No source of ischemia.      Patent stent from previous percutaneous coronary intervention.  2.  Status post chest CT negative for pulmonary embolism in setting of      elevated D-dimer.  3.  CT of the chest:  Normal thoracic aorta, bilateral effusions and      bibasilar atelectasis.  Borderline enlarged mediastinal and hilar nodes      with recommendations for follow-up CT in three to four months.  4.  Recent viral illness with fever, chills and increased liver function      tests, still resolving.  5.  Elevated cardiac markers with a troponin of 0.15 and a CK-MB of 1.0 this      admission.   PAST MEDICAL HISTORY:  1.  Hypertension.  2.  Coronary artery disease, status post a stent to the left anterior      descending artery in 1998.  3.  Mild carotid stenosis, less than 39% on the right, 40-59% on the left.  4.  Dyslipidemia.  5.  History of anemia.  6.  Thyroid nodule being followed by Dr. Christell Constant.   PROCEDURES THIS ADMISSION:  Cardiac catheterization.   HISTORY OF PRESENT ILLNESS:  Yolanda [name] is a 76 year old Caucasian female  with history of coronary artery disease, status post stenting of an 80% LAD  in 1998.  She has done well from a cardiac viewpoint since that time but  Friday developed some chest tightness radiating to her neck and down her  right arm.  It should be noted that Yolanda [name] has also recently been  diagnosed with some type of viral illness over the  last 3 weeks and is being  treated by Dr. Christell Constant.  She had a fever as high as 105 with chills and severe  weakness.  Her LFTs became elevated and her Pravachol was stopped at that  time.  The patient was placed on three antibiotics and eventually got  better.  Yolanda [name] presented to [facility] on the day of admission  complaining of the chest discomfort.  Initial EKG showed normal sinus rhythm  with poor R wave progression.  No acute abnormalities were noted.  It was  decided to admit the patient to [facility] for cardiac catheterization to  reevaluate her cardiac anatomy.  The patient to the catheterization lab on  March 07, 2006.  Initial plan was to proceed with cardiac catheterization on  March 06, 2006.  However, the patient was found to have a potassium of 3.0.  She was treated with p.o. supplements and the catheterization was postponed  until the following morning.  Catheterization results as stated above.  The  patient tolerated the procedure without complications.  Dr. Juanda Chance in to see  patient on June 7.  The patient noted to have elevated D-dimer.  Chest x-ray  also showed density at right base.  The patient was sent for a CT of the  chest.  Results as stated above with recommendations for follow-up CT in 3-4  months.  The patient was made aware of these findings.  The patient is being  discharged home in stable condition.   Lab work at time of discharge:  Sodium 137, potassium 3.9, glucose 93, BUN  14, creatinine 0.9.  WBC 7.9, hemoglobin 11, hematocrit 31.5, with a  platelet count of 276,000.  Hepatic panel:  Alkaline phosphatase elevated at  119, AST 29, ALT is 19.  Cardiac markers:  Troponin peaked at 0.15 with a CK-  MB of 1.0.   Vital signs prior to discharge:  Temperature 97.9, pulse 59, respirations  20, blood pressure 111/63.  The patient is saturating 96% on room air.  Weight this admission 110.6 pounds.   DISPOSITION:  Patient being discharged home.  It is  after 5 p.m.  I have  asked her to call the office and schedule a post cardiac catheterization  visit within the next 1-2 weeks.  She is also to follow up with Dr. Christell Constant as  needed concerning recent illness.  I have also asked the patient to follow  up with Dr. Christell Constant regarding abnormal CT of the chest and need for repeat CT  scan in 3-4 months.  Patient instructed to continue her previous medications  including her hydrochlorothiazide, aspirin, Nexium, fish oil and  vitamin/supplements.  She has been given the post cardiac catheterization  discharge instructions with regard to activity and wound care.  Diet as  previously instructed.  She is to call our office for any problems from her  catheterization site prior to her follow-up visit.   DURATION OF DISCHARGE ENCOUNTER:  30 minutes.      Dorian Pod, NP    ______________________________  Rollene Rotunda, M.D.    MB/MEDQ  D:  03/08/2006  T:  03/09/2006  Job:  [id number]   cc:   Ernestina Penna, M.D.  Fax: [phone]

## 2011-02-21 ENCOUNTER — Ambulatory Visit: Payer: Medicare Other | Admitting: *Deleted

## 2011-02-23 ENCOUNTER — Ambulatory Visit: Payer: Medicare Other | Admitting: Physical Therapy

## 2011-02-28 ENCOUNTER — Ambulatory Visit: Payer: Medicare Other | Admitting: Physical Therapy

## 2011-03-02 ENCOUNTER — Ambulatory Visit: Payer: Medicare Other | Admitting: Physical Therapy

## 2011-05-05 ENCOUNTER — Encounter: Payer: Self-pay | Admitting: Cardiology

## 2011-05-30 ENCOUNTER — Encounter: Payer: Self-pay | Admitting: Cardiology

## 2011-05-31 ENCOUNTER — Encounter: Payer: Self-pay | Admitting: Cardiology

## 2011-05-31 ENCOUNTER — Ambulatory Visit (INDEPENDENT_AMBULATORY_CARE_PROVIDER_SITE_OTHER): Payer: Medicare Other | Admitting: Cardiology

## 2011-05-31 DIAGNOSIS — I10 Essential (primary) hypertension: Secondary | ICD-10-CM

## 2011-05-31 DIAGNOSIS — E785 Hyperlipidemia, unspecified: Secondary | ICD-10-CM

## 2011-05-31 DIAGNOSIS — I6529 Occlusion and stenosis of unspecified carotid artery: Secondary | ICD-10-CM

## 2011-05-31 DIAGNOSIS — I2510 Atherosclerotic heart disease of native coronary artery without angina pectoris: Secondary | ICD-10-CM

## 2011-05-31 NOTE — Assessment & Plan Note (Signed)
The patient has no new sypmtoms.  No further cardiovascular testing is indicated.  We will continue with aggressive risk reduction and meds as listed.  I might suggest routine stress testing but she is very very reluctant to have any testing and in the absence of symptoms I cannot insist.

## 2011-05-31 NOTE — Assessment & Plan Note (Signed)
I reviewed her lipids from this year and they are at target.  I will defer to Dr. Christell Constant.

## 2011-05-31 NOTE — Assessment & Plan Note (Signed)
I do insist that she has follow up of this.  She reluctantly agrees to let us schedule follow up Dopplers

## 2011-05-31 NOTE — Progress Notes (Signed)
HPI The patient presents for follow up of CAD.  Since I last saw her she has done well.  The patient denies any new symptoms such as chest discomfort, neck or arm discomfort. There has been no new shortness of breath, PND or orthopnea. There have been no reported palpitations, presyncope or syncope.  She exercises routinely.  She has had some recent back pain.  Allergies  Allergen Reactions  . K+ Care (Potassium Chloride)   . Lipitor (Atorvastatin Calcium)     Headaches    . Sulfa Antibiotics   . Sulfonamide Derivatives     Current Outpatient Prescriptions  Medication Sig Dispense Refill  . ASA BUFF, MAG CARB-AL GLYC, PO Take by mouth daily.        . calcium carbonate 200 MG capsule Take by mouth 2 (two) times daily with a meal.        . Cholecalciferol (VITAMIN D3) 1000 UNITS CAPS Take by mouth 2 (two) times daily.        . CO ENZYME Q-10 PO Take by mouth.        . colesevelam (WELCHOL) 625 MG tablet Take by mouth. Take one tablet by mouth three times a day       . hydrochlorothiazide (,MICROZIDE/HYDRODIURIL,) 12.5 MG capsule Take 12.5 mg by mouth daily.        . IRON PO Take by mouth every other day.        . Multiple Vitamin (MULTI-VITAMIN PO) Take by mouth.        . omega-3 acid ethyl esters (LOVAZA) 1 G capsule Take 1 g by mouth 3 (three) times daily.       . pantoprazole (PROTONIX) 40 MG tablet Take 40 mg by mouth daily.        . pravastatin (PRAVACHOL) 80 MG tablet Take 40 mg by mouth daily.       . risedronate (ACTONEL) 150 MG tablet Take 150 mg by mouth every 30 (thirty) days. with water on empty stomach, nothing by mouth or lie down for next 30 minutes.         Past Medical History  Diagnosis Date  . Thyroid nodule 8/08  . Lung nodule 1/09  . Fuch's endothelial dystrophy 04/2009    Dr, Katrina Stack   . Osteoporosis   . CAD (coronary artery disease)     LAD lession   . Osteopenia   . Hypertension   . Hyperlipidemia   . Esophagitis, acute   . GERD (gastroesophageal reflux  disease) 11/00  . Gastritis 11/00  . Diverticulosis 11/00  . Hemorrhoids 8/05  . NSVD (normal spontaneous vaginal delivery) 23    female     Past Surgical History  Procedure Date  . Bilateral cataract surgery 06/2007    DR. Groat   . Bilateral cataract surgery 10/08    Dr. Dione Booze   . Facial cosmetic surgery 7/96    Dr. Leodis Binet    . Cardiac cath with angioplasty & stent replacement 4/98  . Cardiac cath 40-50%lad 6/07  . Ct lung screening 6/07    ROS:  Joint pain, back pain.  Otherwise as stated in the HPI and negative for all other systems.  PHYSICAL EXAM BP 98/62  Pulse 66  Resp 16  Ht 5\' 2"  (1.575 m)  Wt 120 lb (54.432 kg)  BMI 21.95 kg/m2 GENERAL:  Well appearing HEENT:  Pupils equal round and reactive, fundi not visualized, oral mucosa unremarkable NECK:  No jugular venous distention, waveform within normal  limits, carotid upstroke brisk and symmetric, no bruits, no thyromegaly LYMPHATICS:  No cervical, inguinal adenopathy LUNGS:  Clear to auscultation bilaterally BACK:  No CVA tenderness CHEST:  Unremarkable HEART:  PMI not displaced or sustained,S1 and S2 within normal limits, no S3, no S4, no clicks, no rubs, no murmurs ABD:  Flat, positive bowel sounds normal in frequency in pitch, no bruits, no rebound, no guarding, no midline pulsatile mass, no hepatomegaly, no splenomegaly EXT:  2 plus pulses throughout, no edema, no cyanosis no clubbing SKIN:  No rashes no nodules NEURO:  Cranial nerves II through XII grossly intact, motor grossly intact throughout PSYCH:  Cognitively intact, oriented to person place and time  EKG:  Sinus rhythm, rate 66, axis within normal limits, intervals within normal limits, non specific inferior ST T wave changes, no change  ASSESSMENT AND PLAN

## 2011-05-31 NOTE — Patient Instructions (Addendum)
Follow up in 1 year with Dr Antoine Poche.  You will receive a letter in the mail 2 months before you are due.  Please call us when you receive this letter to schedule your follow up appointment.  The current medical regimen is effective;  continue present plan and medications.  Your physician has requested that you have a carotid duplex. This test is an ultrasound of the carotid arteries in your neck. It looks at blood flow through these arteries that supply the brain with blood. Allow one hour for this exam. There are no restrictions or special instructions.

## 2011-05-31 NOTE — Assessment & Plan Note (Signed)
Her blood pressure is actually low.  She tolerates this and I will not change her meds.

## 2011-06-27 ENCOUNTER — Encounter (INDEPENDENT_AMBULATORY_CARE_PROVIDER_SITE_OTHER): Payer: Medicare Other | Admitting: *Deleted

## 2011-06-27 DIAGNOSIS — I6529 Occlusion and stenosis of unspecified carotid artery: Secondary | ICD-10-CM

## 2011-07-04 ENCOUNTER — Telehealth: Payer: Self-pay | Admitting: Cardiology

## 2011-07-04 NOTE — Telephone Encounter (Signed)
Pt aware of carotid results.  

## 2011-07-04 NOTE — Telephone Encounter (Signed)
Pt calling re results of carotid °

## 2011-07-17 ENCOUNTER — Ambulatory Visit: Payer: Medicare Other | Attending: Family Medicine | Admitting: Physical Therapy

## 2011-07-17 DIAGNOSIS — IMO0001 Reserved for inherently not codable concepts without codable children: Secondary | ICD-10-CM | POA: Insufficient documentation

## 2011-07-17 DIAGNOSIS — M2560 Stiffness of unspecified joint, not elsewhere classified: Secondary | ICD-10-CM | POA: Insufficient documentation

## 2011-07-17 DIAGNOSIS — R293 Abnormal posture: Secondary | ICD-10-CM | POA: Insufficient documentation

## 2011-07-17 DIAGNOSIS — M542 Cervicalgia: Secondary | ICD-10-CM | POA: Insufficient documentation

## 2011-07-19 ENCOUNTER — Ambulatory Visit: Payer: Medicare Other | Admitting: Physical Therapy

## 2011-07-25 ENCOUNTER — Ambulatory Visit: Payer: Medicare Other | Admitting: *Deleted

## 2011-07-27 ENCOUNTER — Ambulatory Visit: Payer: Medicare Other | Admitting: Physical Therapy

## 2011-08-01 ENCOUNTER — Ambulatory Visit: Payer: Medicare Other | Admitting: *Deleted

## 2011-08-03 ENCOUNTER — Ambulatory Visit: Payer: Medicare Other | Attending: Family Medicine | Admitting: *Deleted

## 2011-08-03 DIAGNOSIS — R293 Abnormal posture: Secondary | ICD-10-CM | POA: Insufficient documentation

## 2011-08-03 DIAGNOSIS — IMO0001 Reserved for inherently not codable concepts without codable children: Secondary | ICD-10-CM | POA: Insufficient documentation

## 2011-08-03 DIAGNOSIS — M2560 Stiffness of unspecified joint, not elsewhere classified: Secondary | ICD-10-CM | POA: Insufficient documentation

## 2011-08-03 DIAGNOSIS — M542 Cervicalgia: Secondary | ICD-10-CM | POA: Insufficient documentation

## 2011-08-04 ENCOUNTER — Other Ambulatory Visit: Payer: Self-pay | Admitting: Family Medicine

## 2011-08-04 DIAGNOSIS — M542 Cervicalgia: Secondary | ICD-10-CM

## 2011-08-08 ENCOUNTER — Ambulatory Visit: Payer: Medicare Other | Admitting: *Deleted

## 2011-08-10 ENCOUNTER — Ambulatory Visit
Admission: RE | Admit: 2011-08-10 | Discharge: 2011-08-10 | Disposition: A | Discharge status: Home / Self Care | Payer: Medicare Other | Source: Ambulatory Visit | Attending: Family Medicine | Admitting: Family Medicine

## 2011-08-10 ENCOUNTER — Ambulatory Visit: Payer: Medicare Other | Admitting: *Deleted

## 2011-08-10 DIAGNOSIS — M542 Cervicalgia: Secondary | ICD-10-CM

## 2011-08-15 ENCOUNTER — Ambulatory Visit: Payer: Medicare Other | Admitting: *Deleted

## 2011-08-17 ENCOUNTER — Ambulatory Visit: Payer: Medicare Other | Admitting: *Deleted

## 2011-09-05 ENCOUNTER — Encounter: Payer: Self-pay | Admitting: Cardiology

## 2011-12-25 ENCOUNTER — Encounter: Payer: Self-pay | Admitting: Cardiology

## 2012-02-06 DIAGNOSIS — Z124 Encounter for screening for malignant neoplasm of cervix: Secondary | ICD-10-CM | POA: Diagnosis not present

## 2012-02-14 DIAGNOSIS — M810 Age-related osteoporosis without current pathological fracture: Secondary | ICD-10-CM | POA: Diagnosis not present

## 2012-04-08 DIAGNOSIS — M810 Age-related osteoporosis without current pathological fracture: Secondary | ICD-10-CM | POA: Diagnosis not present

## 2012-04-16 ENCOUNTER — Encounter: Payer: Self-pay | Admitting: Cardiology

## 2012-04-16 DIAGNOSIS — E559 Vitamin D deficiency, unspecified: Secondary | ICD-10-CM | POA: Diagnosis not present

## 2012-04-16 DIAGNOSIS — E785 Hyperlipidemia, unspecified: Secondary | ICD-10-CM | POA: Diagnosis not present

## 2012-04-16 DIAGNOSIS — E039 Hypothyroidism, unspecified: Secondary | ICD-10-CM | POA: Diagnosis not present

## 2012-04-16 DIAGNOSIS — I10 Essential (primary) hypertension: Secondary | ICD-10-CM | POA: Diagnosis not present

## 2012-04-19 DIAGNOSIS — Z1212 Encounter for screening for malignant neoplasm of rectum: Secondary | ICD-10-CM | POA: Diagnosis not present

## 2012-04-25 DIAGNOSIS — Z961 Presence of intraocular lens: Secondary | ICD-10-CM | POA: Diagnosis not present

## 2012-04-25 DIAGNOSIS — H04129 Dry eye syndrome of unspecified lacrimal gland: Secondary | ICD-10-CM | POA: Diagnosis not present

## 2012-05-20 DIAGNOSIS — L570 Actinic keratosis: Secondary | ICD-10-CM | POA: Diagnosis not present

## 2012-05-20 DIAGNOSIS — Z85828 Personal history of other malignant neoplasm of skin: Secondary | ICD-10-CM | POA: Diagnosis not present

## 2012-05-20 DIAGNOSIS — L719 Rosacea, unspecified: Secondary | ICD-10-CM | POA: Diagnosis not present

## 2012-05-29 ENCOUNTER — Ambulatory Visit (INDEPENDENT_AMBULATORY_CARE_PROVIDER_SITE_OTHER): Payer: Medicare Other | Admitting: Cardiology

## 2012-05-29 ENCOUNTER — Encounter: Payer: Self-pay | Admitting: Cardiology

## 2012-05-29 VITALS — BP 130/60 | HR 59 | Ht 62.0 in | Wt 118.0 lb

## 2012-05-29 DIAGNOSIS — E785 Hyperlipidemia, unspecified: Secondary | ICD-10-CM

## 2012-05-29 DIAGNOSIS — I6529 Occlusion and stenosis of unspecified carotid artery: Secondary | ICD-10-CM | POA: Diagnosis not present

## 2012-05-29 DIAGNOSIS — I2510 Atherosclerotic heart disease of native coronary artery without angina pectoris: Secondary | ICD-10-CM | POA: Diagnosis not present

## 2012-05-29 DIAGNOSIS — I10 Essential (primary) hypertension: Secondary | ICD-10-CM

## 2012-05-29 NOTE — Progress Notes (Signed)
HPI The patient presents for follow up of CAD.  Since I last saw her she has done well.  The patient denies any new symptoms such as chest discomfort, neck or arm discomfort. There has been no new shortness of breath, PND or orthopnea. There have been no reported palpitations, presyncope or syncope.  She exercises routinely walking daily and doing weights.    Allergies  Allergen Reactions  . K+ Care (Potassium Chloride)   . Lipitor (Atorvastatin Calcium)     Headaches    . Sulfa Antibiotics   . Sulfonamide Derivatives     Current Outpatient Prescriptions  Medication Sig Dispense Refill  . ASA BUFF, MAG CARB-AL GLYC, PO Take by mouth daily.        . calcium carbonate 200 MG capsule Take by mouth 2 (two) times daily with a meal.        . Cholecalciferol (VITAMIN D3) 1000 UNITS CAPS Take by mouth 2 (two) times daily.        . CO ENZYME Q-10 PO Take by mouth.        . colesevelam (WELCHOL) 625 MG tablet Take by mouth. Take one tablet by mouth three times a day       . hydrochlorothiazide (,MICROZIDE/HYDRODIURIL,) 12.5 MG capsule Take 12.5 mg by mouth daily.        . IRON PO Take by mouth every other day.        . Multiple Vitamin (MULTI-VITAMIN PO) Take by mouth.        . omega-3 acid ethyl esters (LOVAZA) 1 G capsule Take 1 g by mouth 3 (three) times daily.       . pantoprazole (PROTONIX) 40 MG tablet Take 40 mg by mouth daily.        . pravastatin (PRAVACHOL) 80 MG tablet Take 40 mg by mouth daily.       . risedronate (ACTONEL) 150 MG tablet Take 150 mg by mouth every 30 (thirty) days. with water on empty stomach, nothing by mouth or lie down for next 30 minutes.         Past Medical History  Diagnosis Date  . Thyroid nodule 8/08  . Lung nodule 1/09  . Fuch's endothelial dystrophy 04/2009    Dr, Katrina Stack   . Osteoporosis   . CAD (coronary artery disease)     LAD lession   . Osteopenia   . Hypertension   . Hyperlipidemia   . Esophagitis, acute   . GERD (gastroesophageal reflux  disease) 11/00  . Gastritis 11/00  . Diverticulosis 11/00  . Hemorrhoids 8/05  . NSVD (normal spontaneous vaginal delivery) 30    female     Past Surgical History  Procedure Date  . Bilateral cataract surgery 06/2007    DR. Groat   . Bilateral cataract surgery 10/08    Dr. Dione Booze   . Facial cosmetic surgery 7/96    Dr. Leodis Binet    . Cardiac cath with angioplasty & stent replacement 4/98  . Cardiac cath 40-50%lad 6/07  . Ct lung screening 6/07    ROS:  Joint pain, back pain.  Otherwise as stated in the HPI and negative for all other systems.  PHYSICAL EXAM BP 130/60  Pulse 59  Ht 5\' 2"  (1.575 m)  Wt 118 lb (53.524 kg)  BMI 21.58 kg/m2 GENERAL:  Well appearing HEENT:  Pupils equal round and reactive, fundi not visualized, oral mucosa unremarkable NECK:  No jugular venous distention, waveform within normal limits, carotid upstroke  brisk and symmetric, no bruits, no thyromegaly LYMPHATICS:  No cervical, inguinal adenopathy LUNGS:  Clear to auscultation bilaterally BACK:  No CVA tenderness CHEST:  Unremarkable HEART:  PMI not displaced or sustained,S1 and S2 within normal limits, no S3, no S4, no clicks, no rubs, no murmurs ABD:  Flat, positive bowel sounds normal in frequency in pitch, no bruits, no rebound, no guarding, no midline pulsatile mass, no hepatomegaly, no splenomegaly EXT:  2 plus pulses throughout, no edema, no cyanosis no clubbing SKIN:  No rashes no nodules NEURO:  Cranial nerves II through XII grossly intact, motor grossly intact throughout PSYCH:  Cognitively intact, oriented to person place and time  EKG:  Sinus rhythm, rate 59, axis within normal limits, intervals within normal limits, non specific inferior ST T wave changes, no change. 05/29/2012   ASSESSMENT AND PLAN  CAD, UNSPECIFIED SITE -  The patient has no new sypmtoms.  However it has been 10 years since her last stress test. In 2007 she did have 50% LAD stenosis. She needs to have screening  stress testing but she's had a fall positive exercise treadmill in the past. Therefore, she will have an exercise treadmill combined with perfusion imaging.   CAROTID STENOSIS -  She had 0-39% right stenosis and 40-59% left stenosis and is scheduled for followup in October of next year.   HYPERTENSION, UNSPECIFIED -  Her blood pressure is actually low. She tolerates this and I will not change her meds.  HYPERLIPIDEMIA-MIXED -  I reviewed her lipids from this year  With an LDL of 110 HDL 80. This is followed by Dr. Christell Constant. No change in therapy is indicated.

## 2012-05-29 NOTE — Patient Instructions (Addendum)
Continue current medications as listed.  Your physician has requested that you have a lexiscan myoview. For further information please visit [email or web address] Please follow instruction sheet, as given.  Follow up in 1 year with Dr Antoine Poche.  You will receive a letter in the mail 2 months before you are due.  Please call us when you receive this letter to schedule your follow up appointment.

## 2012-05-30 DIAGNOSIS — H9319 Tinnitus, unspecified ear: Secondary | ICD-10-CM | POA: Diagnosis not present

## 2012-05-30 DIAGNOSIS — H903 Sensorineural hearing loss, bilateral: Secondary | ICD-10-CM | POA: Diagnosis not present

## 2012-06-10 ENCOUNTER — Other Ambulatory Visit (HOSPITAL_COMMUNITY): Payer: Self-pay | Admitting: Radiology

## 2012-06-10 DIAGNOSIS — I2510 Atherosclerotic heart disease of native coronary artery without angina pectoris: Secondary | ICD-10-CM

## 2012-06-10 DIAGNOSIS — H9319 Tinnitus, unspecified ear: Secondary | ICD-10-CM | POA: Diagnosis not present

## 2012-06-10 DIAGNOSIS — H903 Sensorineural hearing loss, bilateral: Secondary | ICD-10-CM | POA: Diagnosis not present

## 2012-06-11 ENCOUNTER — Ambulatory Visit (HOSPITAL_COMMUNITY): Payer: Medicare Other | Attending: Cardiovascular Disease | Admitting: Radiology

## 2012-06-11 VITALS — BP 170/83 | HR 57 | Ht 62.0 in | Wt 117.0 lb

## 2012-06-11 DIAGNOSIS — I4949 Other premature depolarization: Secondary | ICD-10-CM | POA: Diagnosis not present

## 2012-06-11 DIAGNOSIS — Z87891 Personal history of nicotine dependence: Secondary | ICD-10-CM | POA: Diagnosis not present

## 2012-06-11 DIAGNOSIS — I10 Essential (primary) hypertension: Secondary | ICD-10-CM | POA: Diagnosis not present

## 2012-06-11 DIAGNOSIS — I2510 Atherosclerotic heart disease of native coronary artery without angina pectoris: Secondary | ICD-10-CM | POA: Insufficient documentation

## 2012-06-11 MED ORDER — TECHNETIUM TC 99M SESTAMIBI GENERIC - CARDIOLITE
11.0000 | Freq: Once | INTRAVENOUS | Status: AC | PRN
  Administered 2012-06-11: 11 via INTRAVENOUS

## 2012-06-11 MED ORDER — TECHNETIUM TC 99M SESTAMIBI GENERIC - CARDIOLITE
33.0000 | Freq: Once | INTRAVENOUS | Status: AC | PRN
  Administered 2012-06-11: 33 via INTRAVENOUS

## 2012-06-11 NOTE — Progress Notes (Signed)
[facility] SITE 3 NUCLEAR MED [address] [place] [address] [phone]  Cardiology Nuclear Med Study  Yolanda [name] is a 77 y.o. female     MRN : [id number]     DOB: [birth year]  Procedure Date: 06/11/2012  Nuclear Med Background Indication for Stress Test:  Evaluation for Ischemia and Stent Patency History:  '98 Stent-LAD; '05 QMV:HQIONG, EF=83%; '07 Patent stent with n/o CAD, medical tx. Cardiac Risk Factors: Carotid Disease, Family History - CAD, History of Smoking, Hypertension and Lipids  Symptoms:  No cardiac complaints.   Nuclear Pre-Procedure Caffeine/Decaff Intake:  None NPO After: 10:00pm   Lungs:  Clear. O2 Sat: 96% on room air. IV 0.9% NS with Angio Cath:  20g  IV Site: R Antecubital  IV Started by:  Stanton Kidney, EMT-P  Chest Size (in):  34 Cup Size: B  Height: 5\' 2"  (1.575 m)  Weight:  117 lb (53.071 kg)  BMI:  Body mass index is 21.40 kg/(m^2). Tech Comments:  NA    Nuclear Med Study 1 or 2 day study: 1 day  Stress Test Type:  Stress  Reading MD: Charlton Haws, MD  Order Authorizing Provider:  Rollene Rotunda, MD  Resting Radionuclide: Technetium 8m Sestamibi  Resting Radionuclide Dose: 10.3 mCi   Stress Radionuclide:  Technetium 77m Sestamibi  Stress Radionuclide Dose: 33.0 mCi           Stress Protocol Rest HR: 57 Stress HR: 136  Rest BP: 170/83 Stress BP: 211/102  Exercise Time (min): 7:45 METS: 9.7   Predicted Max HR: 145 bpm % Max HR: 93.79 bpm Rate Pressure Product: 29528   Dose of Adenosine (mg):  n/a Dose of Lexiscan: n/a mg  Dose of Atropine (mg): n/a Dose of Dobutamine: n/a mcg/kg/min (at max HR)  Stress Test Technologist: Smiley Houseman, CMA-N  Nuclear Technologist:  Domenic Polite, CNMT     Rest Procedure:  Myocardial perfusion imaging was performed at rest 45 minutes following the intravenous administration of Technetium 45m Sestamibi.  Rest ECG: No acute changes  Stress Procedure:  The patient exercised on  the treadmill utilizing the Bruce protocol for 7:45 minutes. The patient then stopped due to fatigue and denied any chest pain.  There were nonspecific ST-T wave changes with occasional PAC's and rare couplets.  She did have a hypertensive response to exercise, 211/102.  Technetium 49m Sestamibi was injected at peak exercise and myocardial perfusion imaging was performed after a brief delay.  Stress ECG: Insignificant upsloping ST segment depression.  QPS Raw Data Images:  Acquisition technically good; normal left ventricular size. Stress Images:  Normal homogeneous uptake in all areas of the myocardium. Rest Images:  Normal homogeneous uptake in all areas of the myocardium. Subtraction (SDS):  No evidence of ischemia. Transient Ischemic Dilatation (Normal <1.22):  0.95 Lung/Heart Ratio (Normal <0.45):  0.29  Quantitative Gated Spect Images QGS EDV:  48 ml QGS ESV:  8 ml  Impression Exercise Capacity:  Good exercise capacity. BP Response:  Hypertensive blood pressure response. Clinical Symptoms:  No chest pain or dyspnea. ECG Impression:  Insignificant upsloping ST segment depression. Comparison with Prior Nuclear Study: No images to compare  Overall Impression:  Normal stress nuclear study.  LV Ejection Fraction: 83%.  LV Wall Motion:  NL LV Function; NL Wall Motion     Olga Millers

## 2012-06-17 ENCOUNTER — Telehealth: Payer: Self-pay

## 2012-06-17 NOTE — Telephone Encounter (Signed)
Message copied by Yolonda Kida on Mon Jun 17, 2012  1:35 PM ------      Message from: Rollene Rotunda      Created: Sun Jun 16, 2012 10:16 PM       No evidence of ischemia or infarct.   Call Ms. Klump with the results and send results to Rudi Heap, MD

## 2012-06-17 NOTE — Telephone Encounter (Signed)
Patient aware of stress test. 

## 2012-06-26 DIAGNOSIS — Z1231 Encounter for screening mammogram for malignant neoplasm of breast: Secondary | ICD-10-CM | POA: Diagnosis not present

## 2012-07-18 DIAGNOSIS — L570 Actinic keratosis: Secondary | ICD-10-CM | POA: Diagnosis not present

## 2012-07-18 DIAGNOSIS — E785 Hyperlipidemia, unspecified: Secondary | ICD-10-CM | POA: Diagnosis not present

## 2012-07-18 DIAGNOSIS — Z23 Encounter for immunization: Secondary | ICD-10-CM | POA: Diagnosis not present

## 2012-10-14 DIAGNOSIS — M810 Age-related osteoporosis without current pathological fracture: Secondary | ICD-10-CM | POA: Diagnosis not present

## 2012-11-12 DIAGNOSIS — I10 Essential (primary) hypertension: Secondary | ICD-10-CM | POA: Diagnosis not present

## 2012-11-12 DIAGNOSIS — K219 Gastro-esophageal reflux disease without esophagitis: Secondary | ICD-10-CM | POA: Diagnosis not present

## 2012-11-12 DIAGNOSIS — E785 Hyperlipidemia, unspecified: Secondary | ICD-10-CM | POA: Diagnosis not present

## 2012-11-20 DIAGNOSIS — E785 Hyperlipidemia, unspecified: Secondary | ICD-10-CM | POA: Diagnosis not present

## 2012-12-18 ENCOUNTER — Telehealth: Payer: Self-pay | Admitting: *Deleted

## 2012-12-18 NOTE — Telephone Encounter (Signed)
[facility] needs directions on her Zovirax oint, use reference #[id number]\

## 2013-01-13 ENCOUNTER — Ambulatory Visit (INDEPENDENT_AMBULATORY_CARE_PROVIDER_SITE_OTHER): Payer: Medicare Other | Admitting: Family Medicine

## 2013-01-13 VITALS — BP 134/72 | Temp 97.0°F | Ht 62.0 in | Wt 120.0 lb

## 2013-01-13 DIAGNOSIS — K130 Diseases of lips: Secondary | ICD-10-CM | POA: Diagnosis not present

## 2013-01-13 MED ORDER — CEPHALEXIN 500 MG PO CAPS: 500.0000 mg | ORAL_CAPSULE | Freq: Three times a day (TID) | ORAL | Status: DC

## 2013-01-13 NOTE — Progress Notes (Signed)
  Subjective:    Patient ID: Yolanda [name], female    DOB: 1935-01-24, 78 y.o.   MRN: [id number]  HPI Patient returns today he with persistent infection  lower lip. This started out as a fever blister but subsequently I think it has become secondarily infected with bacteria  Review of Systems  Constitutional: Negative.   HENT: Positive for mouth sores.   Eyes: Negative.   Respiratory: Negative.   Cardiovascular: Negative.   Gastrointestinal: Negative.   Endocrine: Negative.   Genitourinary: Negative.   Musculoskeletal: Negative.   Skin: Negative.   Allergic/Immunologic: Negative.   Neurological: Negative.   Hematological: Negative.   Psychiatric/Behavioral: Negative.        Objective:   Physical Exam Today, the lower lip is swollen and infected and  has some blood and crust       Assessment & Plan:  Herpes simplex  Lip cellulitis  Plan: Discontinue Valtrex          Start Keflex 500 mg 3 times daily for 10 days          Call in 2 days for progress

## 2013-01-13 NOTE — Patient Instructions (Addendum)
Clean lip gently with peroxide 3-4 times a day Use Bactroban ointment once or twice daily Call back late Wednesday afternoon, Joyce Gross

## 2013-01-15 ENCOUNTER — Telehealth: Payer: Self-pay | Admitting: Family Medicine

## 2013-01-16 NOTE — Telephone Encounter (Signed)
Pt notified that it is ok to use hydrogen peroxide but also needs to use Bactroban ointment

## 2013-02-05 DIAGNOSIS — L570 Actinic keratosis: Secondary | ICD-10-CM | POA: Diagnosis not present

## 2013-02-05 DIAGNOSIS — K130 Diseases of lips: Secondary | ICD-10-CM | POA: Diagnosis not present

## 2013-04-02 ENCOUNTER — Encounter: Payer: Self-pay | Admitting: Family Medicine

## 2013-04-02 ENCOUNTER — Ambulatory Visit (INDEPENDENT_AMBULATORY_CARE_PROVIDER_SITE_OTHER): Payer: Medicare Other | Admitting: Family Medicine

## 2013-04-02 VITALS — BP 128/67 | HR 64 | Temp 96.9°F | Ht 62.5 in | Wt 116.4 lb

## 2013-04-02 DIAGNOSIS — I10 Essential (primary) hypertension: Secondary | ICD-10-CM

## 2013-04-02 DIAGNOSIS — Q742 Other congenital malformations of lower limb(s), including pelvic girdle: Secondary | ICD-10-CM

## 2013-04-02 DIAGNOSIS — I2510 Atherosclerotic heart disease of native coronary artery without angina pectoris: Secondary | ICD-10-CM

## 2013-04-02 DIAGNOSIS — M810 Age-related osteoporosis without current pathological fracture: Secondary | ICD-10-CM | POA: Diagnosis not present

## 2013-04-02 DIAGNOSIS — E785 Hyperlipidemia, unspecified: Secondary | ICD-10-CM | POA: Diagnosis not present

## 2013-04-02 DIAGNOSIS — R350 Frequency of micturition: Secondary | ICD-10-CM

## 2013-04-02 DIAGNOSIS — E559 Vitamin D deficiency, unspecified: Secondary | ICD-10-CM

## 2013-04-02 DIAGNOSIS — K219 Gastro-esophageal reflux disease without esophagitis: Secondary | ICD-10-CM

## 2013-04-02 DIAGNOSIS — Q667 Congenital pes cavus, unspecified foot: Secondary | ICD-10-CM

## 2013-04-02 LAB — POCT CBC
Lymph, poc: 2.9 (ref 0.6–3.4)
MCH, POC: 34.3 pg — AB (ref 27–31.2)
MCHC: 36.2 g/dL — AB (ref 31.8–35.4)
MCV: 94.6 fL (ref 80–97)
MPV: 6.6 fL (ref 0–99.8)
POC LYMPH PERCENT: 38.8 %L (ref 10–50)
Platelet Count, POC: 166 10*3/uL (ref 142–424)

## 2013-04-02 LAB — BASIC METABOLIC PANEL WITH GFR
CO2: 28 mEq/L (ref 19–32)
Calcium: 9.9 mg/dL (ref 8.4–10.5)
Chloride: 102 mEq/L (ref 96–112)
Glucose, Bld: 83 mg/dL (ref 70–99)
Sodium: 140 mEq/L (ref 135–145)

## 2013-04-02 LAB — POCT URINALYSIS DIPSTICK
Glucose, UA: NEGATIVE
Protein, UA: NEGATIVE
Urobilinogen, UA: NEGATIVE

## 2013-04-02 LAB — POCT UA - MICROSCOPIC ONLY
RBC, urine, microscopic: NEGATIVE
Yeast, UA: NEGATIVE

## 2013-04-02 LAB — THYROID PANEL WITH TSH
Free Thyroxine Index: 2.9 (ref 1.0–3.9)
T3 Uptake: 29.7 % (ref 22.5–37.0)

## 2013-04-02 MED ORDER — COLESEVELAM HCL 625 MG PO TABS: 1875.0000 mg | ORAL_TABLET | Freq: Two times a day (BID) | ORAL | Status: DC

## 2013-04-02 MED ORDER — PANTOPRAZOLE SODIUM 40 MG PO TBEC: 40.0000 mg | DELAYED_RELEASE_TABLET | Freq: Every day | ORAL | Status: DC

## 2013-04-02 MED ORDER — PRAVASTATIN SODIUM 80 MG PO TABS: 80.0000 mg | ORAL_TABLET | Freq: Every day | ORAL | Status: DC

## 2013-04-02 MED ORDER — HYDROCHLOROTHIAZIDE 12.5 MG PO CAPS: 12.5000 mg | ORAL_CAPSULE | Freq: Every day | ORAL | Status: DC

## 2013-04-02 NOTE — Patient Instructions (Addendum)
Fall precautions discussed Continue current meds and therapeutic lifestyle changes For your information, pelvic exam in May of 15 and mammogram in October of this year

## 2013-04-02 NOTE — Addendum Note (Signed)
Addended by: Prescott Gum on: 04/02/2013 09:29 AM   Modules accepted: Orders

## 2013-04-02 NOTE — Progress Notes (Signed)
  Subjective:    Patient ID: Yolanda [name], female    DOB: October 02, 1935, 78 y.o.   MRN: [id number]  HPI The patient returns to clinic today for followup of chronic medical problems which include hypertension, hyperlipidemia, GERD, coronary artery disease, and osteoporosis. She is followed by Dr. Berna Bue periodic lady for her coronary artery disease. She denies any chest pain or shortness of breath. She isn't exercising regularly and maintaining good health practices. She also describes some frequency with minimal dysuria.   Review of Systems  Constitutional: Negative.   HENT: Negative.   Eyes: Positive for pain (seeing Dr Dione Booze next week).  Respiratory: Negative.   Cardiovascular: Negative.   Gastrointestinal: Negative.   Genitourinary: Positive for frequency.  Musculoskeletal: Positive for arthralgias (bilateral knees).  Skin: Negative.   Allergic/Immunologic: Positive for environmental allergies (seasonal,Spring and Fall ).  Neurological: Negative.   Psychiatric/Behavioral: Positive for sleep disturbance (occasional).       Objective:   Physical Exam BP 128/67  Pulse 64  Temp(Src) 96.9 F (36.1 C) (Oral)  Ht 5' 2.5" (1.588 m)  Wt 116 lb 6.4 oz (52.799 kg)  BMI 20.94 kg/m2  The patient appeared well nourished and normally developed, alert and oriented to time and place. Speech, behavior and judgement appear normal. Vital signs as documented.  Head exam is unremarkable. No scleral icterus or pallor noted. Ears nose and throat all within norma.Marland Kitchenl limits Neck is without jugular venous distension, thyromegally, or carotid bruits. Carotid upstrokes are brisk bilaterally. No cervical adenopathy. Lungs are clear anteriorly and posteriorly to auscultation. Normal respiratory effort. Cardiac exam reveals regular rate and rhythm at 72 per minute. . First and second heart sounds normal.  No murmurs, rubs or gallops.  Abdominal exam reveals normal bowl sounds, no masses, no organomegaly and  no aortic enlargement. No inguinal adenopathy. Extremities are nonedematous and both femoral and pedal pulses are normal. Skin without pallor or jaundice.  Warm and dry, without rash. Neurologic exam reveals normal deep tendon reflexes and normal sensation.          Assessment & Plan:  1. Hyperlipemia - NMR Lipoprofile with Lipids; Standing  2. Hypertension - POCT CBC; Standing - BASIC METABOLIC PANEL WITH GFR; Standing  3. GERD (gastroesophageal reflux disease)  4. CAD (coronary artery disease) - NMR Lipoprofile with Lipids; Standing - BASIC METABOLIC PANEL WITH GFR; Standing  5. Osteoporosis - Thyroid Panel With TSH  6. Vitamin D deficiency - Vitamin D 25 hydroxy; Standing  7. Urinary frequency - POCT urinalysis dipstick - POCT UA - Microscopic Only  Patient Instructions  Fall precautions discussed Continue current meds and therapeutic lifestyle changes For your information, pelvic exam in May of 15 and mammogram in October of this year   We will call you with the results of the urinalysis.

## 2013-04-02 NOTE — Addendum Note (Signed)
Addended by: Bearl Mulberry on: 04/02/2013 09:44 AM   Modules accepted: Orders

## 2013-04-02 NOTE — Addendum Note (Signed)
Addended by: Lisbeth Ply C on: 04/02/2013 02:19 PM   Modules accepted: Orders

## 2013-04-03 LAB — NMR LIPOPROFILE WITH LIPIDS
Cholesterol, Total: 192 mg/dL (ref <200)
HDL Particle Number: 46 umol/L (ref >=30.5)
HDL-C: 82 mg/dL (ref >=40)
LDL Size: 20.7 nm (ref >20.5)
LP-IR Score: <25 (ref <=45)
Large HDL-P: 14.2 umol/L (ref >=4.8)
Small LDL Particle Number: 468 nmol/L (ref <=527)

## 2013-04-04 LAB — URINE CULTURE: Colony Count: >=100000

## 2013-04-09 ENCOUNTER — Other Ambulatory Visit: Payer: Self-pay | Admitting: *Deleted

## 2013-04-09 DIAGNOSIS — N390 Urinary tract infection, site not specified: Secondary | ICD-10-CM

## 2013-04-09 MED ORDER — CIPROFLOXACIN HCL 500 MG PO TABS: 500.0000 mg | ORAL_TABLET | Freq: Two times a day (BID) | ORAL | Status: DC

## 2013-04-09 NOTE — Progress Notes (Signed)
Patient aware of urine culture results and that script is being sent to the pharmacy.  She will f/u in 2 weeks.

## 2013-04-09 NOTE — Addendum Note (Signed)
Addended by: Gwenith Daily on: 04/09/2013 09:28 AM   Modules accepted: Orders

## 2013-04-14 ENCOUNTER — Ambulatory Visit (INDEPENDENT_AMBULATORY_CARE_PROVIDER_SITE_OTHER): Payer: Medicare Other | Admitting: Pharmacist

## 2013-04-14 VITALS — Ht 62.5 in | Wt 119.0 lb

## 2013-04-14 DIAGNOSIS — M810 Age-related osteoporosis without current pathological fracture: Secondary | ICD-10-CM | POA: Diagnosis not present

## 2013-04-14 MED ORDER — DENOSUMAB 60 MG/ML ~~LOC~~ SOLN
60.0000 mg | Freq: Once | SUBCUTANEOUS | Status: AC
  Administered 2013-04-14: 60 mg via SUBCUTANEOUS

## 2013-04-14 NOTE — Progress Notes (Signed)
Osteoporosis Clinic Current Height: Height: 5' 2.5" (158.8 cm)      Current Weight: Weight: 119 lb (53.978 kg)       Ethnicity:Caucasian   HPI: Patient with osteoporosis.  Has taken Forteo in past for 2 years, then Actonel 35mg  weekly and fosamax weekly.   Prior fracture?  No Current Med(s) for Osteoporosis/Osteopenia:  Prolia 60mg  SQ q 6 months - started 04/08/2012                                                              Last Vitamin D Result:  58 (04/02/2013) Last GFR Result:  52 (04/02/2013)    Calcium Assessment Calcium Intake  # of servings/day  Calcium mg  Milk (8 oz) 1  x  300  = 300mg   Yogurt (4 oz) 0 x  200 = 0  Cheese (1 oz) 0 x  200 = 0  Other Calcium sources   250mg   Ca supplement Calcium 200mg bid and MVI = 800mg    Estimated calcium intake per day 1350mg     DEXA Results Date of Test T-Score for AP Spine L1-L4 T-Score for Total Left Hip T-Score for Total Right Hip  02/14/2012 -2.0 -1.8 -1.5  08/11/2009 -2.4 -1.6 -1.6  05/13/2007 -2.2 -1.5 -1.7  04/24/2005 -2.7 -1.6 --   **T-Score for neck of Left hip on 02/14/2012 was -2.8**  Assessment: Osteoporosis - tolerating Prolia well  Recommendations: 1.  COntinue Prolia 60mg  1ml SQ q6 months - administered today  2.  continue calcium 1200mg  daily through supplementation or diet.  3.  continue weight bearing exercise - 30 minutes at least 4 days  per week.   4.  Counseled and educated about fall risk and prevention.  Recheck DEXA:  1 year - due 02/14/2012 or after  Time spent counseling patient:  15 minutes

## 2013-04-16 ENCOUNTER — Telehealth: Payer: Self-pay | Admitting: *Deleted

## 2013-04-16 NOTE — Telephone Encounter (Signed)
Message copied by Bearl Mulberry on Wed Apr 16, 2013  6:52 PM ------      Message from: Ernestina Penna      Created: Wed Apr 02, 2013  1:17 PM       The CBC was within normal limits with a normal white blood cell count normal hemoglobin and normal platelet.       The urinalysis had 5-10 infection cells per high power field.----- please do urine culture and sensitivity, start Cipro 500 twice daily for 7 days ------

## 2013-04-16 NOTE — Telephone Encounter (Signed)
Pt notified of results

## 2013-04-30 ENCOUNTER — Other Ambulatory Visit (INDEPENDENT_AMBULATORY_CARE_PROVIDER_SITE_OTHER): Payer: Medicare Other

## 2013-04-30 DIAGNOSIS — N390 Urinary tract infection, site not specified: Secondary | ICD-10-CM

## 2013-04-30 LAB — POCT UA - MICROSCOPIC ONLY
Bacteria, U Microscopic: NEGATIVE
Epithelial cells, urine per micros: NEGATIVE

## 2013-04-30 LAB — POCT URINALYSIS DIPSTICK
Bilirubin, UA: NEGATIVE
Glucose, UA: NEGATIVE
Ketones, UA: NEGATIVE
Nitrite, UA: NEGATIVE
Spec Grav, UA: 1.015
pH, UA: 5

## 2013-05-07 DIAGNOSIS — H04129 Dry eye syndrome of unspecified lacrimal gland: Secondary | ICD-10-CM | POA: Diagnosis not present

## 2013-05-07 DIAGNOSIS — Z961 Presence of intraocular lens: Secondary | ICD-10-CM | POA: Diagnosis not present

## 2013-05-08 ENCOUNTER — Encounter: Payer: Self-pay | Admitting: *Deleted

## 2013-05-21 DIAGNOSIS — L570 Actinic keratosis: Secondary | ICD-10-CM | POA: Diagnosis not present

## 2013-05-21 DIAGNOSIS — Z85828 Personal history of other malignant neoplasm of skin: Secondary | ICD-10-CM | POA: Diagnosis not present

## 2013-05-21 DIAGNOSIS — D0439 Carcinoma in situ of skin of other parts of face: Secondary | ICD-10-CM | POA: Diagnosis not present

## 2013-05-21 DIAGNOSIS — D485 Neoplasm of uncertain behavior of skin: Secondary | ICD-10-CM | POA: Diagnosis not present

## 2013-06-05 DIAGNOSIS — C44320 Squamous cell carcinoma of skin of unspecified parts of face: Secondary | ICD-10-CM | POA: Diagnosis not present

## 2013-07-11 DIAGNOSIS — Z23 Encounter for immunization: Secondary | ICD-10-CM | POA: Diagnosis not present

## 2013-08-20 ENCOUNTER — Ambulatory Visit (INDEPENDENT_AMBULATORY_CARE_PROVIDER_SITE_OTHER): Payer: Medicare Other | Admitting: Family Medicine

## 2013-08-20 ENCOUNTER — Encounter: Payer: Self-pay | Admitting: Family Medicine

## 2013-08-20 VITALS — BP 164/80 | HR 61 | Temp 97.1°F | Ht 62.5 in | Wt 116.0 lb

## 2013-08-20 DIAGNOSIS — E785 Hyperlipidemia, unspecified: Secondary | ICD-10-CM | POA: Diagnosis not present

## 2013-08-20 DIAGNOSIS — I10 Essential (primary) hypertension: Secondary | ICD-10-CM | POA: Diagnosis not present

## 2013-08-20 DIAGNOSIS — D649 Anemia, unspecified: Secondary | ICD-10-CM | POA: Diagnosis not present

## 2013-08-20 DIAGNOSIS — E559 Vitamin D deficiency, unspecified: Secondary | ICD-10-CM

## 2013-08-20 DIAGNOSIS — K219 Gastro-esophageal reflux disease without esophagitis: Secondary | ICD-10-CM | POA: Insufficient documentation

## 2013-08-20 DIAGNOSIS — Z23 Encounter for immunization: Secondary | ICD-10-CM | POA: Diagnosis not present

## 2013-08-20 DIAGNOSIS — J309 Allergic rhinitis, unspecified: Secondary | ICD-10-CM

## 2013-08-20 DIAGNOSIS — I2510 Atherosclerotic heart disease of native coronary artery without angina pectoris: Secondary | ICD-10-CM

## 2013-08-20 LAB — POCT CBC
Hemoglobin: 14.9 g/dL (ref 12.2–16.2)
Lymph, poc: 2.3 (ref 0.6–3.4)
MCH, POC: 30.4 pg (ref 27–31.2)
MPV: 7 fL (ref 0–99.8)
POC Granulocyte: 4.5 (ref 2–6.9)
POC LYMPH PERCENT: 31.8 %L (ref 10–50)
Platelet Count, POC: 170 10*3/uL (ref 142–424)
RBC: 4.9 M/uL (ref 4.04–5.48)

## 2013-08-20 MED ORDER — OMEGA-3-ACID ETHYL ESTERS 1 G PO CAPS: 2.0000 g | ORAL_CAPSULE | Freq: Two times a day (BID) | ORAL | Status: DC

## 2013-08-20 MED ORDER — POTASSIUM CHLORIDE ER 10 MEQ PO TBCR: 10.0000 meq | EXTENDED_RELEASE_TABLET | Freq: Every day | ORAL | Status: DC

## 2013-08-20 NOTE — Progress Notes (Signed)
Subjective:    Patient ID: Yolanda [name], female    DOB: 05-13-1935, 78 y.o.   MRN: [id number]  HPI Pt here for follow up and management of chronic medical problems.  The biggest complaint today is dry skin.     Patient Active Problem List   Diagnosis Date Noted  . THYROID NODULE 01/11/2009  . HYPERLIPIDEMIA-MIXED 01/11/2009  . ANEMIA 01/11/2009  . HYPERTENSION, UNSPECIFIED 01/11/2009  . CAD, UNSPECIFIED SITE 01/11/2009  . CAROTID STENOSIS 01/11/2009   Outpatient Encounter Prescriptions as of 08/20/2013  Medication Sig  . aspirin EC 81 MG tablet Take 81 mg by mouth daily.  Marland Kitchen azelastine (ASTELIN) 137 MCG/SPRAY nasal spray Place into both nostrils 2 (two) times daily. Use in each nostril as directed  . calcium carbonate 200 MG capsule Take by mouth 2 (two) times daily with a meal.    . Cholecalciferol (VITAMIN D3) 1000 UNITS CAPS Take by mouth 2 (two) times daily.    . CO ENZYME Q-10 PO Take by mouth.    . colesevelam (WELCHOL) 625 MG tablet Take 3 tablets (1,875 mg total) by mouth 2 (two) times daily with a meal. Take one tablet by mouth three times a day  . denosumab (PROLIA) 60 MG/ML SOLN injection Inject 60 mg into the skin every 6 (six) months. Administer in upper arm, thigh, or abdomen  . hydrochlorothiazide (MICROZIDE) 12.5 MG capsule Take 1 capsule (12.5 mg total) by mouth daily.  . IRON PO Take by mouth every other day.    Marland Kitchen KLOR-CON 10 10 MEQ tablet Take 1 tablet by mouth daily.  . Multiple Vitamin (MULTI-VITAMIN PO) Take by mouth.    . omega-3 acid ethyl esters (LOVAZA) 1 G capsule Take 1 g by mouth 3 (three) times daily.   . pantoprazole (PROTONIX) 40 MG tablet Take 1 tablet (40 mg total) by mouth daily.  . pravastatin (PRAVACHOL) 80 MG tablet Take 1 tablet (80 mg total) by mouth daily.    Review of Systems  Constitutional: Negative.   HENT: Negative.   Eyes: Negative.   Respiratory: Negative.   Cardiovascular: Negative.   Gastrointestinal: Negative.   Endocrine:  Negative.   Genitourinary: Negative.   Musculoskeletal: Negative.   Skin: Negative.        Very dry skin  Allergic/Immunologic: Negative.   Neurological: Negative.   Hematological: Negative.   Psychiatric/Behavioral: Negative.        Objective:   Physical Exam  Nursing note and vitals reviewed. Constitutional: She is oriented to person, place, and time. She appears well-developed and well-nourished. No distress.  For her age  HENT:  Head: Normocephalic and atraumatic.  Right Ear: External ear normal.  Left Ear: External ear normal.  Mouth/Throat: Oropharynx is clear and moist. No oropharyngeal exudate.  Nasal irritation and redness  Eyes: Conjunctivae and EOM are normal. Pupils are equal, round, and reactive to light. Right eye exhibits no discharge. Left eye exhibits no discharge. No scleral icterus.  Neck: Normal range of motion. Neck supple. No JVD present. No thyromegaly present.  Cardiovascular: Normal rate, regular rhythm, normal heart sounds and intact distal pulses.  Exam reveals no gallop and no friction rub.   No murmur heard. At 60 per minute  Pulmonary/Chest: Effort normal and breath sounds normal. No respiratory distress. She has no wheezes. She has no rales. She exhibits no tenderness.  Dry cough  Abdominal: Soft. Bowel sounds are normal. She exhibits no distension and no mass. There is no tenderness. There is no  rebound and no guarding.  Musculoskeletal: Normal range of motion. She exhibits no edema and no tenderness.  Lymphadenopathy:    She has no cervical adenopathy.  Neurological: She is alert and oriented to person, place, and time. She has normal reflexes. No cranial nerve deficit.  Skin: Skin is warm and dry.  Psychiatric: She has a normal mood and affect. Her behavior is normal. Judgment and thought content normal.   BP 164/80  Pulse 61  Temp(Src) 97.1 F (36.2 C) (Oral)  Ht 5' 2.5" (1.588 m)  Wt 116 lb (52.617 kg)  BMI 20.87 kg/m2         Assessment & Plan:   1. HYPERLIPIDEMIA-MIXED   2. CAD, UNSPECIFIED SITE   3. HYPERTENSION, UNSPECIFIED   4. ANEMIA   5. Vitamin D deficiency   6. Allergic rhinitis   7. GERD (gastroesophageal reflux disease)    Orders Placed This Encounter  Procedures  . Hepatic function panel  . BMP8+EGFR  . NMR, lipoprofile  . Vit D  25 hydroxy (rtn osteoporosis monitoring)  . POCT CBC   Meds ordered this encounter  Medications  . DISCONTD: KLOR-CON 10 10 MEQ tablet    Sig: Take 1 tablet by mouth daily.  Marland Kitchen azelastine (ASTELIN) 137 MCG/SPRAY nasal spray    Sig: Place into both nostrils 2 (two) times daily. Use in each nostril as directed  . potassium chloride (KLOR-CON 10) 10 MEQ tablet    Sig: Take 1 tablet (10 mEq total) by mouth daily.    Dispense:  90 tablet    Refill:  3  . omega-3 acid ethyl esters (LOVAZA) 1 G capsule    Sig: Take 2 capsules (2 g total) by mouth 2 (two) times daily.    Dispense:  360 capsule    Refill:  3   Patient Instructions  Continue current medications. Continue good therapeutic lifestyle changes which include good diet and exercise. Fall precautions discussed with patient. If you are over 68 years old - you may need Prevnar 13 or the adult Pneumonia vaccine. You will receive Prevnar vaccination today. Use a cool mist humidifier at home Use Ayr nose spray and Ayr gel to help keep her nasal passages moisturized Be sure and use  Scent-free  soaps, detergents, and fabric softeners. I will discuss with the [facility] pharmacist the possibility of a special oil to use in your bathwater For the small area of irritation in the center of your back, try some Lamisil cream, which is over-the-counter.   Nyra Capes MD

## 2013-08-20 NOTE — Patient Instructions (Addendum)
Continue current medications. Continue good therapeutic lifestyle changes which include good diet and exercise. Fall precautions discussed with patient. If you are over 76 years old - you may need Prevnar 13 or the adult Pneumonia vaccine. You will receive Prevnar vaccination today. Use a cool mist humidifier at home Use Ayr nose spray and Ayr gel to help keep her nasal passages moisturized Be sure and use  Scent-free  soaps, detergents, and fabric softeners. I will discuss with the [facility] pharmacist the possibility of a special oil to use in your bathwater For the small area of irritation in the center of your back, try some Lamisil cream, which is over-the-counter.

## 2013-08-21 ENCOUNTER — Encounter: Payer: Self-pay | Admitting: *Deleted

## 2013-08-21 NOTE — Addendum Note (Signed)
Addended by: Magdalene River on: 08/21/2013 09:01 AM   Modules accepted: Orders

## 2013-08-21 NOTE — Progress Notes (Signed)
Quick Note:  Copy of labs sent to patient ______ 

## 2013-08-22 LAB — BMP8+EGFR
Calcium: 10.4 mg/dL — ABNORMAL HIGH (ref 8.6–10.2)
Creatinine, Ser: 1.01 mg/dL — ABNORMAL HIGH (ref 0.57–1.00)
GFR calc Af Amer: 62 mL/min/{1.73_m2} (ref >59)
GFR calc non Af Amer: 54 mL/min/{1.73_m2} — ABNORMAL LOW (ref >59)
Glucose: 91 mg/dL (ref 65–99)
Sodium: 142 mmol/L (ref 134–144)

## 2013-08-22 LAB — HEPATIC FUNCTION PANEL
ALT: 15 IU/L (ref 0–32)
Bilirubin, Direct: 0.14 mg/dL (ref 0.00–0.40)
Total Bilirubin: 0.5 mg/dL (ref 0.0–1.2)
Total Protein: 6.7 g/dL (ref 6.0–8.5)

## 2013-08-22 LAB — NMR, LIPOPROFILE
Cholesterol: 184 mg/dL (ref <200)
HDL Cholesterol by NMR: 83 mg/dL (ref >=40)
LDL Particle Number: 922 nmol/L (ref <1000)
Triglycerides by NMR: 97 mg/dL (ref <150)

## 2013-08-29 ENCOUNTER — Other Ambulatory Visit (INDEPENDENT_AMBULATORY_CARE_PROVIDER_SITE_OTHER): Payer: Medicare Other

## 2013-08-29 DIAGNOSIS — Z1212 Encounter for screening for malignant neoplasm of rectum: Secondary | ICD-10-CM

## 2013-08-29 NOTE — Progress Notes (Signed)
Pt dropped off FOBT only 

## 2013-10-09 ENCOUNTER — Encounter: Payer: Self-pay | Admitting: Family Medicine

## 2013-10-09 ENCOUNTER — Ambulatory Visit (INDEPENDENT_AMBULATORY_CARE_PROVIDER_SITE_OTHER): Payer: Medicare Other | Admitting: Family Medicine

## 2013-10-09 VITALS — BP 141/84 | HR 95 | Temp 98.6°F | Ht 62.5 in | Wt 116.0 lb

## 2013-10-09 DIAGNOSIS — J069 Acute upper respiratory infection, unspecified: Secondary | ICD-10-CM

## 2013-10-09 DIAGNOSIS — R509 Fever, unspecified: Secondary | ICD-10-CM | POA: Diagnosis not present

## 2013-10-09 LAB — POCT INFLUENZA A/B
Influenza A, POC: NEGATIVE
Influenza B, POC: NEGATIVE

## 2013-10-09 MED ORDER — AZITHROMYCIN 250 MG PO TABS: ORAL_TABLET | ORAL | Status: DC

## 2013-10-09 NOTE — Patient Instructions (Signed)

## 2013-10-09 NOTE — Progress Notes (Signed)
   Subjective:    Patient ID: Yolanda [name], female    DOB: January 27, 1935, 79 y.o.   MRN: [id number]  HPI This 79 y.o. female presents for evaluation of fever and malaise.  She had fever of 102.0 last night And took tylenol.  She has been having some congestion.   Review of Systems C/o fever and congestion. No chest pain, SOB, HA, dizziness, vision change, N/V, diarrhea, constipation, dysuria, urinary urgency or frequency, myalgias, arthralgias or rash.     Objective:   Physical Exam Vital signs noted  Well developed well nourished female.  HEENT - Head atraumatic Normocephalic                Eyes - PERRLA, Conjuctiva - clear Sclera- Clear EOMI                Ears - EAC's Wnl TM's Wnl Gross Hearing WNL                Throat - oropharanx wnl Respiratory - Lungs CTA bilateral Cardiac - RRR S1 and S2 without murmur GI - Abdomen soft Nontender and bowel sounds active x 4 Extremities - No edema. Neuro - Grossly intact.  Results for orders placed in visit on 10/09/13  POCT INFLUENZA A/B      Result Value Range   Influenza A, POC Negative     Influenza B, POC Negative        Assessment & Plan:  Fever - Plan: POCT Influenza A/B, azithromycin (ZITHROMAX) 250 MG tablet  URI (upper respiratory infection) - Plan: azithromycin (ZITHROMAX) 250 MG tablet Push po fluids, rest, tylenol and motrin otc prn as directed for fever, arthralgias, and myalgias.  Follow up prn if sx's continue or persist.  Lysbeth Penner FNP

## 2013-10-15 ENCOUNTER — Telehealth: Payer: Self-pay | Admitting: Pharmacist

## 2013-10-15 NOTE — Telephone Encounter (Signed)
appt for Prolia inejction made for 10/16/13 at 3pm

## 2013-10-16 ENCOUNTER — Ambulatory Visit (INDEPENDENT_AMBULATORY_CARE_PROVIDER_SITE_OTHER): Payer: Medicare Other | Admitting: Pharmacist

## 2013-10-16 DIAGNOSIS — M810 Age-related osteoporosis without current pathological fracture: Secondary | ICD-10-CM | POA: Diagnosis not present

## 2013-10-16 MED ORDER — CALCIUM CARBONATE 200 MG PO CAPS
250.0000 mg | ORAL_CAPSULE | Freq: Every day | ORAL | Status: DC
Start: 2013-10-16 — End: 2015-12-15

## 2013-10-16 MED ORDER — DENOSUMAB 60 MG/ML ~~LOC~~ SOLN
60.0000 mg | Freq: Once | SUBCUTANEOUS | Status: AC
  Administered 2013-10-16: 60 mg via SUBCUTANEOUS

## 2013-10-16 NOTE — Progress Notes (Signed)
   HPI: Patient with osteoporosis, here today for prolia injection.  She has taken Forteo in past for 2 years, then Actonel 35mg  weekly and fosamax weekly.   Prior fracture?  No Current Med(s) for Osteoporosis/Osteopenia:  Prolia 60mg  SQ q 6 months - started 04/08/2012.  Last injeciton 04/2013.                                                              Last Vitamin D Result:  48.4 (08/2013) Last GFR Result:  54 (08/2013) Last calcium was 10.4 (08/2013) - slightly elevated but Prolia can sometimes lower calcium.    Calcium Assessment Calcium Intake  # of servings/day  Calcium mg  Milk (8 oz) 1  x  300  = 300mg   Yogurt (4 oz) 0 x  200 = 0  Cheese (1 oz) 0 x  200 = 0  Other Calcium sources   250mg   Ca supplement Calcium 200mg bid and MVI = 800mg    Estimated calcium intake per day 1350mg     DEXA Results Date of Test T-Score for AP Spine L1-L4 T-Score for Total Left Hip T-Score for Total Right Hip  02/14/2012 -2.0 -1.8 -1.5  08/11/2009 -2.4 -1.6 -1.6  05/13/2007 -2.2 -1.5 -1.7  04/24/2005 -2.7 -1.6 --   **T-Score for neck of Left hip on 02/14/2012 was -2.8**  Assessment: Osteoporosis - tolerating Prolia well  Recommendations: 1.  COntinue Prolia 60mg  20ml SQ q6 months - administered today  2.  Decrease calcium supplement to 1 daily (was taking bid)  3.  continue weight bearing exercise - 30 minutes at least 4 days  per week.   4.  Counseled and educated about fall risk and prevention.  Recheck DEXA:  1 year - due 02/13/2014 or after  Time spent counseling patient:  15 minutes

## 2013-12-03 DIAGNOSIS — L570 Actinic keratosis: Secondary | ICD-10-CM | POA: Diagnosis not present

## 2013-12-03 DIAGNOSIS — L259 Unspecified contact dermatitis, unspecified cause: Secondary | ICD-10-CM | POA: Diagnosis not present

## 2013-12-03 DIAGNOSIS — Z85828 Personal history of other malignant neoplasm of skin: Secondary | ICD-10-CM | POA: Diagnosis not present

## 2013-12-03 DIAGNOSIS — B009 Herpesviral infection, unspecified: Secondary | ICD-10-CM | POA: Diagnosis not present

## 2013-12-09 ENCOUNTER — Encounter: Payer: Self-pay | Admitting: Family Medicine

## 2013-12-09 ENCOUNTER — Ambulatory Visit (INDEPENDENT_AMBULATORY_CARE_PROVIDER_SITE_OTHER): Payer: Medicare Other

## 2013-12-09 ENCOUNTER — Ambulatory Visit (INDEPENDENT_AMBULATORY_CARE_PROVIDER_SITE_OTHER): Payer: Medicare Other | Admitting: Family Medicine

## 2013-12-09 VITALS — BP 142/77 | HR 63 | Temp 97.4°F | Ht 62.5 in | Wt 116.0 lb

## 2013-12-09 DIAGNOSIS — R209 Unspecified disturbances of skin sensation: Secondary | ICD-10-CM

## 2013-12-09 DIAGNOSIS — R200 Anesthesia of skin: Secondary | ICD-10-CM

## 2013-12-09 DIAGNOSIS — I10 Essential (primary) hypertension: Secondary | ICD-10-CM | POA: Diagnosis not present

## 2013-12-09 DIAGNOSIS — I2510 Atherosclerotic heart disease of native coronary artery without angina pectoris: Secondary | ICD-10-CM | POA: Diagnosis not present

## 2013-12-09 DIAGNOSIS — B009 Herpesviral infection, unspecified: Secondary | ICD-10-CM

## 2013-12-09 DIAGNOSIS — D649 Anemia, unspecified: Secondary | ICD-10-CM

## 2013-12-09 DIAGNOSIS — E785 Hyperlipidemia, unspecified: Secondary | ICD-10-CM | POA: Diagnosis not present

## 2013-12-09 DIAGNOSIS — E559 Vitamin D deficiency, unspecified: Secondary | ICD-10-CM

## 2013-12-09 DIAGNOSIS — B001 Herpesviral vesicular dermatitis: Secondary | ICD-10-CM

## 2013-12-09 DIAGNOSIS — R202 Paresthesia of skin: Secondary | ICD-10-CM

## 2013-12-09 DIAGNOSIS — M542 Cervicalgia: Secondary | ICD-10-CM

## 2013-12-09 LAB — POCT CBC
Granulocyte percent: 49.2 %G (ref 37–80)
HEMATOCRIT: 43.4 % (ref 37.7–47.9)
Hemoglobin: 14 g/dL (ref 12.2–16.2)
LYMPH, POC: 2.5 (ref 0.6–3.4)
MCH: 30.4 pg (ref 27–31.2)
MCHC: 32.2 g/dL (ref 31.8–35.4)
MCV: 94.5 fL (ref 80–97)
MPV: 6.7 fL (ref 0–99.8)
PLATELET COUNT, POC: 176 10*3/uL (ref 142–424)
POC Granulocyte: 2.7 (ref 2–6.9)
POC LYMPH PERCENT: 47.1 %L (ref 10–50)
RBC: 4.6 M/uL (ref 4.04–5.48)
RDW, POC: 13.8 %
WBC: 5.4 10*3/uL (ref 4.6–10.2)

## 2013-12-09 MED ORDER — ACYCLOVIR 5 % EX OINT: 1.0000 "application " | TOPICAL_OINTMENT | CUTANEOUS | Status: DC

## 2013-12-09 NOTE — Patient Instructions (Addendum)
Medicare Annual Wellness Visit  [facility] and the medical providers at [facility] strive to bring you the best medical care.  In doing so we not only want to address your current medical conditions and concerns but also to detect new conditions early and prevent illness, disease and health-related problems.    Medicare offers a yearly Wellness Visit which allows our clinical staff to assess your need for preventative services including immunizations, lifestyle education, counseling to decrease risk of preventable diseases and screening for fall risk and other medical concerns.    This visit is provided free of charge (no copay) for all Medicare recipients. The clinical pharmacists at [facility] have begun to conduct these Wellness Visits which will also include a thorough review of all your medications.    As you primary medical provider recommend that you make an appointment for your Annual Wellness Visit if you have not done so already this year.  You may set up this appointment before you leave today or you may call back ([phone]) and schedule an appointment.  Please make sure when you call that you mention that you are scheduling your Annual Wellness Visit with the clinical pharmacist so that the appointment may be made for the proper length of time.     Continue current medications. Continue good therapeutic lifestyle changes which include good diet and exercise. Fall precautions discussed with patient. If an FOBT was given today- please return it to our front desk. If you are over 35 years old - you may need Prevnar 56 or the adult Pneumonia vaccine.  Take Advil 200 mg, one twice daily after breakfast and supper Continue to take pantoprazole before breakfast and take a Zantac 150 before supper while taking the Advil Use warm wet compresses to neck  Of with heavy lifting pushing pulling or vacuum cleaning If this  tingling in the neck continues we may need to do additional x-ray studies Use acyclovir ointment as needed for fever blisters Keep lips as moist as possible with moisturizers and salve

## 2013-12-09 NOTE — Progress Notes (Signed)
Subjective:    Patient ID: Yolanda [name], female    DOB: [birth year], 79 y.o.   MRN: [id number]  HPI Pt here for follow up and management of chronic medical problems. Health maintenance issues the patient was seen be due for her mammogram and pelvic exam. She is also due to have her appointment with the cardiologist. She will get a chest x-ray and lab work today. Her DEXA scan will be due in May. The patient also describes periodic tingling in her left arm. It is worse with laying in the bed and with sitting up and not related to activity. This comes and goes. She also describes the frequent occurrence of fever blisters especially over the past 2-3 months.          Patient Active Problem List   Diagnosis Date Noted  . Osteoporosis, postmenopausal 10/16/2013  . Allergic rhinitis 08/20/2013  . Vitamin D deficiency 08/20/2013  . GERD (gastroesophageal reflux disease) 08/20/2013  . THYROID NODULE 01/11/2009  . HYPERLIPIDEMIA-MIXED 01/11/2009  . ANEMIA 01/11/2009  . HYPERTENSION, UNSPECIFIED 01/11/2009  . CAD, UNSPECIFIED SITE 01/11/2009  . CAROTID STENOSIS 01/11/2009   Outpatient Encounter Prescriptions as of 12/09/2013  Medication Sig  . aspirin EC 81 MG tablet Take 81 mg by mouth daily.  . calcium carbonate 200 MG capsule Take 1 capsule (200 mg total) by mouth daily.  . Cholecalciferol (VITAMIN D3) 1000 UNITS CAPS Take by mouth 2 (two) times daily.    . CO ENZYME Q-10 PO Take by mouth.    . colesevelam (WELCHOL) 625 MG tablet Take 3 tablets (1,875 mg total) by mouth 2 (two) times daily with a meal. Take one tablet by mouth three times a day  . denosumab (PROLIA) 60 MG/ML SOLN injection Inject 60 mg into the skin every 6 (six) months. Administer in upper arm, thigh, or abdomen  . hydrochlorothiazide (MICROZIDE) 12.5 MG capsule Take 1 capsule (12.5 mg total) by mouth daily.  . IRON PO Take by mouth every other day.    . Multiple Vitamin (MULTI-VITAMIN PO) Take by mouth.    . omega-3  acid ethyl esters (LOVAZA) 1 G capsule Take 2 capsules (2 g total) by mouth 2 (two) times daily.  . pantoprazole (PROTONIX) 40 MG tablet Take 1 tablet (40 mg total) by mouth daily.  . potassium chloride (KLOR-CON 10) 10 MEQ tablet Take 1 tablet (10 mEq total) by mouth daily.  . pravastatin (PRAVACHOL) 80 MG tablet Take 1 tablet (80 mg total) by mouth daily.  . [DISCONTINUED] azelastine (ASTELIN) 137 MCG/SPRAY nasal spray Place into both nostrils 2 (two) times daily. Use in each nostril as directed  . [DISCONTINUED] azithromycin (ZITHROMAX) 250 MG tablet Take 2 po first day and then one po qd x 4 days    Review of Systems  Constitutional: Negative.   HENT: Negative.        Fever blister  Eyes: Negative.   Respiratory: Negative.   Cardiovascular: Negative.   Gastrointestinal: Negative.   Endocrine: Negative.   Genitourinary: Negative.   Musculoskeletal: Negative.        Off/on left arm tingling/stinging sensation.  Skin: Negative.   Allergic/Immunologic: Negative.   Neurological: Negative.   Hematological: Negative.   Psychiatric/Behavioral: Negative.        Objective:   Physical Exam  Nursing note and vitals reviewed. Constitutional: She is oriented to person, place, and time. She appears well-developed and well-nourished. No distress.  HENT:  Head: Normocephalic and atraumatic.  Right Ear: External  ear normal.  Left Ear: External ear normal.  Mouth/Throat: Oropharynx is clear and moist.  Slight nasal congestion  Eyes: Conjunctivae and EOM are normal. Pupils are equal, round, and reactive to light. Right eye exhibits no discharge. Left eye exhibits no discharge. No scleral icterus.  Neck: Normal range of motion. Neck supple. No thyromegaly present.  No carotid bruits auscultated  Cardiovascular: Normal rate, regular rhythm, normal heart sounds and intact distal pulses.  Exam reveals no gallop and no friction rub.   No murmur heard. A 72 per minute  Pulmonary/Chest: Effort  normal and breath sounds normal. No respiratory distress. She has no wheezes. She has no rales.  Abdominal: Soft. Bowel sounds are normal. She exhibits no mass. There is no tenderness. There is no rebound and no guarding.  Musculoskeletal: Normal range of motion. She exhibits no edema and no tenderness.  Lymphadenopathy:    She has no cervical adenopathy.  Neurological: She is alert and oriented to person, place, and time. She has normal reflexes. No cranial nerve deficit.  Skin: Skin is warm and dry. No rash noted. No erythema.  No current fever blisters observed, skin is generally relatively dry  Psychiatric: She has a normal mood and affect. Her behavior is normal. Judgment and thought content normal.   BP 142/77  Pulse 63  Temp(Src) 97.4 F (36.3 C) (Oral)  Ht 5' 2.5" (1.588 m)  Wt 116 lb (52.617 kg)  BMI 20.87 kg/m2  [facility] reading (PRIMARY) by  DrMoore-chest-no active disease                                                                     C-spine -degenerative changes with disc space narrowing                                   Assessment & Plan:  1. ANEMIA  - POCT CBC  2. CAD, UNSPECIFIED SITE - BMP8+EGFR - Hepatic function panel  3. HYPERLIPIDEMIA-MIXED - NMR, lipoprofile  4. HYPERTENSION, UNSPECIFIED - DG Chest 2 View; Future - BMP8+EGFR - Hepatic function panel  5. Vitamin D deficiency - Vit D  25 hydroxy (rtn osteoporosis monitoring)  6. Numbness and tingling in left arm - DG Cervical Spine Complete; Future  7. Fever blister -Acyclovir prescribed  8. Neck pain  Patient Instructions                       Medicare Annual Wellness Visit  [facility] and the medical providers at [facility] strive to bring you the best medical care.  In doing so we not only want to address your current medical conditions and concerns but also to detect new conditions early and prevent illness, disease and health-related problems.    Medicare  offers a yearly Wellness Visit which allows our clinical staff to assess your need for preventative services including immunizations, lifestyle education, counseling to decrease risk of preventable diseases and screening for fall risk and other medical concerns.    This visit is provided free of charge (no copay) for all Medicare recipients. The clinical pharmacists at [facility] have begun to conduct these Wellness  Visits which will also include a thorough review of all your medications.    As you primary medical provider recommend that you make an appointment for your Annual Wellness Visit if you have not done so already this year.  You may set up this appointment before you leave today or you may call back ([phone]) and schedule an appointment.  Please make sure when you call that you mention that you are scheduling your Annual Wellness Visit with the clinical pharmacist so that the appointment may be made for the proper length of time.     Continue current medications. Continue good therapeutic lifestyle changes which include good diet and exercise. Fall precautions discussed with patient. If an FOBT was given today- please return it to our front desk. If you are over 68 years old - you may need Prevnar 39 or the adult Pneumonia vaccine.  Take Advil 200 mg, one twice daily after breakfast and supper Continue to take pantoprazole before breakfast and take a Zantac 150 before supper while taking the Advil Use warm wet compresses to neck  Of with heavy lifting pushing pulling or vacuum cleaning If this tingling in the neck continues we may need to do additional x-ray studies Use acyclovir ointment as needed for fever blisters Keep lips as moist as possible with moisturizers and salve   Call in a couple of weeks for progress with your neck  Arrie Senate MD

## 2013-12-11 LAB — HEPATIC FUNCTION PANEL
ALBUMIN: 4.4 g/dL (ref 3.5–4.8)
ALT: 16 IU/L (ref 0–32)
AST: 33 IU/L (ref 0–40)
Alkaline Phosphatase: 36 IU/L — ABNORMAL LOW (ref 39–117)
BILIRUBIN DIRECT: 0.15 mg/dL (ref 0.00–0.40)
TOTAL PROTEIN: 6.6 g/dL (ref 6.0–8.5)
Total Bilirubin: 0.5 mg/dL (ref 0.0–1.2)

## 2013-12-11 LAB — NMR, LIPOPROFILE
CHOLESTEROL: 190 mg/dL (ref <200)
HDL Cholesterol by NMR: 92 mg/dL (ref >=40)
HDL Particle Number: 46.3 umol/L (ref >=30.5)
LDL Particle Number: 593 nmol/L (ref <1000)
LDL Size: 20.9 nm (ref >20.5)
LDLC SERPL CALC-MCNC: 82 mg/dL (ref <100)
LP-IR Score: <25 (ref <=45)
Small LDL Particle Number: <90 nmol/L (ref <=527)
Triglycerides by NMR: 81 mg/dL (ref <150)

## 2013-12-11 LAB — BMP8+EGFR
BUN/Creatinine Ratio: 12 (ref 11–26)
BUN: 12 mg/dL (ref 8–27)
CHLORIDE: 98 mmol/L (ref 97–108)
CO2: 27 mmol/L (ref 18–29)
Calcium: 9.7 mg/dL (ref 8.7–10.3)
Creatinine, Ser: 1.04 mg/dL — ABNORMAL HIGH (ref 0.57–1.00)
GFR, EST AFRICAN AMERICAN: 60 mL/min/{1.73_m2} (ref >59)
GFR, EST NON AFRICAN AMERICAN: 52 mL/min/{1.73_m2} — AB (ref >59)
GLUCOSE: 87 mg/dL (ref 65–99)
Potassium: 4.5 mmol/L (ref 3.5–5.2)
Sodium: 138 mmol/L (ref 134–144)

## 2013-12-11 LAB — VITAMIN D 25 HYDROXY (VIT D DEFICIENCY, FRACTURES): Vit D, 25-Hydroxy: 47.8 ng/mL (ref 30.0–100.0)

## 2014-01-09 ENCOUNTER — Telehealth: Payer: Self-pay | Admitting: Family Medicine

## 2014-01-09 NOTE — Telephone Encounter (Signed)
Noticed 3 small areas on left cheek 2 days ago. It has developed in to a large reddened area that is slightly pruritic and warm to the touch.  Denies pain or known insect bite. She questioned Rosacea but she doesn't have a history so that is unlikely. She has applied lotion and cortisone 10.  There are no appointments available today but offered appt tomorrow morning. Patient is going out of town and doesn't think she can come in tomorrow. Advised to continue cortisone 10 and apply cool compresses. She can take Benadryl per directions on box as well. She is aware that someone is on call when we are closed and that she can call back if symptoms worsen.  She will seek care at Urgent Care while out of town if necessary.  Patient stated understanding and agreement to plan.

## 2014-01-24 ENCOUNTER — Other Ambulatory Visit: Payer: Self-pay | Admitting: Family Medicine

## 2014-01-27 DIAGNOSIS — Z1231 Encounter for screening mammogram for malignant neoplasm of breast: Secondary | ICD-10-CM | POA: Diagnosis not present

## 2014-02-25 ENCOUNTER — Encounter: Payer: Self-pay | Admitting: *Deleted

## 2014-02-25 ENCOUNTER — Encounter: Payer: Self-pay | Admitting: Cardiology

## 2014-02-25 ENCOUNTER — Ambulatory Visit (INDEPENDENT_AMBULATORY_CARE_PROVIDER_SITE_OTHER): Payer: Medicare Other | Admitting: Cardiology

## 2014-02-25 VITALS — BP 133/79 | HR 69 | Ht 62.0 in | Wt 115.0 lb

## 2014-02-25 DIAGNOSIS — I6529 Occlusion and stenosis of unspecified carotid artery: Secondary | ICD-10-CM

## 2014-02-25 DIAGNOSIS — I2510 Atherosclerotic heart disease of native coronary artery without angina pectoris: Secondary | ICD-10-CM

## 2014-02-25 DIAGNOSIS — I10 Essential (primary) hypertension: Secondary | ICD-10-CM

## 2014-02-25 NOTE — Patient Instructions (Signed)

## 2014-02-25 NOTE — Progress Notes (Signed)
HPI The patient presents for follow up of CAD.  Since I last saw her she has done well.  The patient denies any new symptoms such as chest discomfort, neck or arm discomfort. There has been no new shortness of breath, PND or orthopnea. There have been no reported palpitations, presyncope or syncope.  She exercises routinely walking daily.    Allergies  Allergen Reactions  . K+ Care [Potassium Chloride]   . Lipitor [Atorvastatin Calcium]     Headaches    . Sulfa Antibiotics   . Sulfonamide Derivatives     Current Outpatient Prescriptions  Medication Sig Dispense Refill  . acyclovir ointment (ZOVIRAX) 5 % Apply 1 application topically every 3 (three) hours. Apply frequently and sparingly to cold sores as needed.  30 g  4  . aspirin EC 81 MG tablet Take 81 mg by mouth daily.      . calcium carbonate 200 MG capsule Take 1 capsule (200 mg total) by mouth daily.      . Cholecalciferol (VITAMIN D3) 1000 UNITS CAPS Take by mouth 2 (two) times daily.        . CO ENZYME Q-10 PO Take by mouth.        . colesevelam (WELCHOL) 625 MG tablet Take 3 tablets (1,875 mg total) by mouth 2 (two) times daily with a meal. Take one tablet by mouth three times a day  540 tablet  4  . denosumab (PROLIA) 60 MG/ML SOLN injection Inject 60 mg into the skin every 6 (six) months. Administer in upper arm, thigh, or abdomen      . hydrochlorothiazide (MICROZIDE) 12.5 MG capsule Take 1 capsule (12.5 mg total) by mouth daily.  90 capsule  4  . IRON PO Take by mouth every other day.        . Multiple Vitamin (MULTI-VITAMIN PO) Take by mouth.        . omega-3 acid ethyl esters (LOVAZA) 1 G capsule Take 2 capsules (2 g total) by mouth 2 (two) times daily.  360 capsule  3  . pantoprazole (PROTONIX) 40 MG tablet TAKE 1 TABLET DAILY  90 tablet  1  . potassium chloride (KLOR-CON 10) 10 MEQ tablet Take 1 tablet (10 mEq total) by mouth daily.  90 tablet  3  . pravastatin (PRAVACHOL) 80 MG tablet Take 1 tablet (80 mg total) by  mouth daily.  90 tablet  4   No current facility-administered medications for this visit.    Past Medical History  Diagnosis Date  . Thyroid nodule 8/08  . Lung nodule 1/09  . Fuch's endothelial dystrophy 04/2009    Dr, Shanon Rosser   . Osteoporosis   . CAD (coronary artery disease)     LAD lession   . Osteopenia   . Hypertension   . Hyperlipidemia   . Esophagitis, acute   . GERD (gastroesophageal reflux disease) 11/00  . Gastritis 11/00  . Diverticulosis 11/00  . Hemorrhoids 8/05  . NSVD (normal spontaneous vaginal delivery) 45    female     Past Surgical History  Procedure Laterality Date  . Bilateral cataract surgery  06/2007    DR. Groat   . Bilateral cataract surgery  10/08    Dr. Katy Fitch   . Facial cosmetic surgery  7/96    Dr. Mikle Bosworth    . Cardiac cath with angioplasty & stent replacement  4/98  . Cardiac cath 40-50%lad  6/07  . Ct lung screening  6/07    ROS:  Joint pain, back pain.  Otherwise as stated in the HPI and negative for all other systems.  PHYSICAL EXAM BP 133/79  Pulse 69  Ht 5\' 2"  (1.575 m)  Wt 115 lb (52.164 kg)  BMI 21.03 kg/m2 GENERAL:  Well appearing HEENT:  Pupils equal round and reactive, fundi not visualized, oral mucosa unremarkable NECK:  No jugular venous distention, waveform within normal limits, carotid upstroke brisk and symmetric, no bruits, no thyromegaly LUNGS:  Clear to auscultation bilaterally BACK:  No CVA tenderness CHEST:  Unremarkable HEART:  PMI not displaced or sustained,S1 and S2 within normal limits, no S3, no S4, no clicks, no rubs, no murmurs ABD:  Flat, positive bowel sounds normal in frequency in pitch, no bruits, no rebound, no guarding, no midline pulsatile mass, no hepatomegaly, no splenomegaly EXT:  2 plus pulses throughout, no edema, no cyanosis no clubbing SKIN:  No rashes no nodules   EKG:  Sinus rhythm, rate 66, axis within normal limits, intervals within normal limits, non specific inferior ST T wave changes,  no change. 02/25/2014   ASSESSMENT AND PLAN  CAD, UNSPECIFIED SITE -  She has had no new symptoms since her stress perfusion study in 2013.  No change in therapy is indicated.   CAROTID STENOSIS -  She had 0-39% right stenosis and 40-59% left stenosis.  She has not had this checked since 2012.  I will schedule follow up.   HYPERTENSION, UNSPECIFIED -  The blood pressure is at target. No change in medications is indicated. We will continue with therapeutic lifestyle changes (TLC).  HYPERLIPIDEMIA-MIXED -  I reviewed her lipids from this year  This is followed by Dr. Laurance Flatten. No change in therapy is indicated.

## 2014-03-03 ENCOUNTER — Encounter (INDEPENDENT_AMBULATORY_CARE_PROVIDER_SITE_OTHER): Payer: Medicare Other

## 2014-03-03 DIAGNOSIS — I6529 Occlusion and stenosis of unspecified carotid artery: Secondary | ICD-10-CM | POA: Diagnosis not present

## 2014-03-05 ENCOUNTER — Other Ambulatory Visit: Payer: Self-pay | Admitting: *Deleted

## 2014-03-05 MED ORDER — HYDROCHLOROTHIAZIDE 12.5 MG PO CAPS: 12.5000 mg | ORAL_CAPSULE | Freq: Every day | ORAL | Status: DC

## 2014-03-10 ENCOUNTER — Telehealth: Payer: Self-pay | Admitting: Cardiology

## 2014-03-10 NOTE — Telephone Encounter (Signed)
New message          Pt returning call to nurse about results

## 2014-03-11 NOTE — Telephone Encounter (Signed)
Pt aware of results of carotid doppler

## 2014-03-17 ENCOUNTER — Other Ambulatory Visit: Payer: Medicare Other | Admitting: Nurse Practitioner

## 2014-04-07 ENCOUNTER — Telehealth: Payer: Self-pay | Admitting: Pharmacist

## 2014-04-07 NOTE — Telephone Encounter (Signed)
Patient called because prolia injection is due 04/15/14.   Need to check labs prior and she will have labs drawn 04/14/14 when she sees Dr Laurance Flatten.  She had appt with MMM for PAP for 04/28/14 but she has another appt for that day and needs to reschedule.  Appt changed to 05/04/14 at 2:15pm.  Will give Prolia at that appt.

## 2014-04-11 ENCOUNTER — Other Ambulatory Visit: Payer: Self-pay | Admitting: Family Medicine

## 2014-04-14 ENCOUNTER — Ambulatory Visit (INDEPENDENT_AMBULATORY_CARE_PROVIDER_SITE_OTHER): Payer: Medicare Other | Admitting: Family Medicine

## 2014-04-14 ENCOUNTER — Encounter: Payer: Self-pay | Admitting: Family Medicine

## 2014-04-14 VITALS — BP 161/77 | HR 68 | Temp 97.0°F | Ht 62.0 in | Wt 116.0 lb

## 2014-04-14 DIAGNOSIS — D649 Anemia, unspecified: Secondary | ICD-10-CM

## 2014-04-14 DIAGNOSIS — J301 Allergic rhinitis due to pollen: Secondary | ICD-10-CM

## 2014-04-14 DIAGNOSIS — E559 Vitamin D deficiency, unspecified: Secondary | ICD-10-CM | POA: Diagnosis not present

## 2014-04-14 DIAGNOSIS — I2510 Atherosclerotic heart disease of native coronary artery without angina pectoris: Secondary | ICD-10-CM

## 2014-04-14 DIAGNOSIS — B009 Herpesviral infection, unspecified: Secondary | ICD-10-CM

## 2014-04-14 DIAGNOSIS — M810 Age-related osteoporosis without current pathological fracture: Secondary | ICD-10-CM

## 2014-04-14 DIAGNOSIS — I10 Essential (primary) hypertension: Secondary | ICD-10-CM | POA: Diagnosis not present

## 2014-04-14 DIAGNOSIS — K219 Gastro-esophageal reflux disease without esophagitis: Secondary | ICD-10-CM

## 2014-04-14 DIAGNOSIS — B001 Herpesviral vesicular dermatitis: Secondary | ICD-10-CM

## 2014-04-14 DIAGNOSIS — E785 Hyperlipidemia, unspecified: Secondary | ICD-10-CM | POA: Diagnosis not present

## 2014-04-14 LAB — POCT CBC
Granulocyte percent: 56.1 %G (ref 37–80)
HCT, POC: 44.1 % (ref 37.7–47.9)
Hemoglobin: 14.4 g/dL (ref 12.2–16.2)
LYMPH, POC: 2.6 (ref 0.6–3.4)
MCH, POC: 31.3 pg — AB (ref 27–31.2)
MCHC: 32.8 g/dL (ref 31.8–35.4)
MCV: 95.6 fL (ref 80–97)
MPV: 6.3 fL (ref 0–99.8)
POC GRANULOCYTE: 3.8 (ref 2–6.9)
POC LYMPH %: 38.1 % (ref 10–50)
Platelet Count, POC: 177 10*3/uL (ref 142–424)
RBC: 4.6 M/uL (ref 4.04–5.48)
RDW, POC: 12.4 %
WBC: 6.7 10*3/uL (ref 4.6–10.2)

## 2014-04-14 MED ORDER — FLUTICASONE PROPIONATE 50 MCG/ACT NA SUSP: NASAL | Status: DC

## 2014-04-14 MED ORDER — AZELASTINE HCL 0.1 % NA SOLN: 1.0000 | Freq: Two times a day (BID) | NASAL | Status: DC

## 2014-04-14 MED ORDER — VALACYCLOVIR HCL 1 G PO TABS: 1000.0000 mg | ORAL_TABLET | Freq: Every day | ORAL | Status: DC

## 2014-04-14 NOTE — Patient Instructions (Addendum)
Medicare Annual Wellness Visit  [facility] and the medical providers at [facility] strive to bring you the best medical care.  In doing so we not only want to address your current medical conditions and concerns but also to detect new conditions early and prevent illness, disease and health-related problems.    Medicare offers a yearly Wellness Visit which allows our clinical staff to assess your need for preventative services including immunizations, lifestyle education, counseling to decrease risk of preventable diseases and screening for fall risk and other medical concerns.    This visit is provided free of charge (no copay) for all Medicare recipients. The clinical pharmacists at [facility] have begun to conduct these Wellness Visits which will also include a thorough review of all your medications.    As you primary medical provider recommend that you make an appointment for your Annual Wellness Visit if you have not done so already this year.  You may set up this appointment before you leave today or you may call back ([phone]) and schedule an appointment.  Please make sure when you call that you mention that you are scheduling your Annual Wellness Visit with the clinical pharmacist so that the appointment may be made for the proper length of time.     Continue current medications. Continue good therapeutic lifestyle changes which include good diet and exercise. Fall precautions discussed with patient. If an FOBT was given today- please return it to our front desk. If you are over 77 years old - you may need Prevnar 57 or the adult Pneumonia vaccine.  Please use the Valtrex as directed  Drink plenty of fluids Both nose sprays will be helpful in allergic rhinitis Continue to exercise regularly Always be careful and did not put yourself at a risk for falling

## 2014-04-14 NOTE — Progress Notes (Signed)
Subjective:    Patient ID: Yolanda [name], female    DOB: 10-13-1934, 79 y.o.   MRN: [id number]  HPI Pt here for follow up and management of chronic medical problems. The patient has nasal congestion and cold sores. She sees the cardiologist on a regular basis. She is due to get her pelvic exam and this will be scheduled with Shelah Lewandowsky and August. She is also due to get lab work today. The patient is concerned about cycle there doesn't seem to work as quickly as possible for fever blister even though she takes it correctly. She has also not been using the fluticasone nose spray regularly.       Patient Active Problem List   Diagnosis Date Noted  . Osteoporosis, postmenopausal 10/16/2013  . Allergic rhinitis 08/20/2013  . Vitamin D deficiency 08/20/2013  . GERD (gastroesophageal reflux disease) 08/20/2013  . THYROID NODULE 01/11/2009  . HYPERLIPIDEMIA-MIXED 01/11/2009  . ANEMIA 01/11/2009  . HYPERTENSION, UNSPECIFIED 01/11/2009  . CAD, UNSPECIFIED SITE 01/11/2009  . CAROTID STENOSIS 01/11/2009   Outpatient Encounter Prescriptions as of 04/14/2014  Medication Sig  . acyclovir ointment (ZOVIRAX) 5 % Apply 1 application topically every 3 (three) hours. Apply frequently and sparingly to cold sores as needed.  Marland Kitchen aspirin EC 81 MG tablet Take 81 mg by mouth daily.  . calcium carbonate 200 MG capsule Take 1 capsule (200 mg total) by mouth daily.  . Cholecalciferol (VITAMIN D3) 1000 UNITS CAPS Take by mouth 2 (two) times daily.    . CO ENZYME Q-10 PO Take by mouth.    . colesevelam (WELCHOL) 625 MG tablet Take 3 tablets (1,875 mg total) by mouth 2 (two) times daily with a meal. Take one tablet by mouth three times a day  . denosumab (PROLIA) 60 MG/ML SOLN injection Inject 60 mg into the skin every 6 (six) months. Administer in upper arm, thigh, or abdomen  . hydrochlorothiazide (MICROZIDE) 12.5 MG capsule Take 1 capsule (12.5 mg total) by mouth daily.  . IRON PO Take by mouth every other  day.    . Multiple Vitamin (MULTI-VITAMIN PO) Take by mouth.    . omega-3 acid ethyl esters (LOVAZA) 1 G capsule Take 2 capsules (2 g total) by mouth 2 (two) times daily.  . pantoprazole (PROTONIX) 40 MG tablet TAKE 1 TABLET DAILY  . potassium chloride (KLOR-CON 10) 10 MEQ tablet Take 1 tablet (10 mEq total) by mouth daily.  . pravastatin (PRAVACHOL) 80 MG tablet TAKE 1 TABLET DAILY  . [DISCONTINUED] hydrochlorothiazide (MICROZIDE) 12.5 MG capsule Take 1 capsule (12.5 mg total) by mouth daily.  . [DISCONTINUED] pravastatin (PRAVACHOL) 80 MG tablet Take 1 tablet (80 mg total) by mouth daily.    Review of Systems  Constitutional: Negative.   HENT: Positive for congestion (nasal) and postnasal drip.        Cold sores/ fever blister  Eyes: Negative.   Respiratory: Negative.   Cardiovascular: Negative.   Gastrointestinal: Negative.   Endocrine: Negative.   Genitourinary: Negative.   Musculoskeletal: Negative.   Skin: Negative.   Allergic/Immunologic: Negative.   Neurological: Negative.   Hematological: Negative.   Psychiatric/Behavioral: Negative.        Objective:   Physical Exam  Nursing note and vitals reviewed. Constitutional: She is oriented to person, place, and time. She appears well-developed and well-nourished. No distress.  Pleasant and cooperative  HENT:  Head: Normocephalic and atraumatic.  Right Ear: External ear normal.  Left Ear: External ear normal.  Mouth/Throat:  Oropharynx is clear and moist. No oropharyngeal exudate.  Patient is wearing bilateral hearing aids, ear canals are clear appear Nasal congestion and turbinate swelling bilaterally  Eyes: Conjunctivae and EOM are normal. Pupils are equal, round, and reactive to light. Right eye exhibits no discharge. Left eye exhibits no discharge. No scleral icterus.  Neck: Normal range of motion. Neck supple. No thyromegaly present.   No carotid bruits  Cardiovascular: Normal rate, regular rhythm, normal heart sounds  and intact distal pulses.  Exam reveals no gallop and no friction rub.   No murmur heard. At 72 per minute  Pulmonary/Chest: Effort normal and breath sounds normal. No respiratory distress. She has no wheezes. She has no rales. She exhibits no tenderness.  Abdominal: Soft. Bowel sounds are normal. She exhibits no mass. There is no tenderness. There is no rebound and no guarding.  Musculoskeletal: Normal range of motion. She exhibits no edema and no tenderness.  Lymphadenopathy:    She has no cervical adenopathy.  Neurological: She is alert and oriented to person, place, and time. She has normal reflexes. No cranial nerve deficit.  Skin: Skin is warm and dry. No rash noted.  Psychiatric: She has a normal mood and affect. Her behavior is normal. Judgment and thought content normal.   BP 156/83  Pulse 68  Temp(Src) 97 F (36.1 C) (Oral)  Ht '5\' 2"'  (1.575 m)  Wt 116 lb (52.617 kg)  BMI 21.21 kg/m2        Assessment & Plan:  1. ANEMIA - POCT CBC - Thyroid Panel With TSH  2. CAD, UNSPECIFIED SITE - POCT CBC - BMP8+EGFR - Hepatic function panel - Thyroid Panel With TSH  3. Gastroesophageal reflux disease, esophagitis presence not specified - POCT CBC - Thyroid Panel With TSH  4. HYPERLIPIDEMIA-MIXED - POCT CBC - NMR, lipoprofile - Thyroid Panel With TSH  5. HYPERTENSION, UNSPECIFIED - POCT CBC - BMP8+EGFR - Hepatic function panel - Thyroid Panel With TSH  6. Vitamin D deficiency - POCT CBC - Vit D  25 hydroxy (rtn osteoporosis monitoring) - Thyroid Panel With TSH  7. Osteoporosis - Vit D  25 hydroxy (rtn osteoporosis monitoring) - Thyroid Panel With TSH - Magnesium - Phosphorus  8. Allergic rhinitis due to pollen - fluticasone (FLONASE) 50 MCG/ACT nasal spray; 1-2 sprays each nostril daily as directed for allergic rhinitis  Dispense: 16 g; Refill: 6  9. Recurrent cold sores -Use medication as directed  Meds ordered this encounter  Medications  .  valACYclovir (VALTREX) 1000 MG tablet    Sig: Take 1 tablet (1,000 mg total) by mouth daily. As directed    Dispense:  90 tablet    Refill:  0  . azelastine (ASTELIN) 0.1 % nasal spray    Sig: Place 1 spray into both nostrils 2 (two) times daily. Use in each nostril as directed    Dispense:  90 mL    Refill:  11  . fluticasone (FLONASE) 50 MCG/ACT nasal spray    Sig: 1-2 sprays each nostril daily as directed for allergic rhinitis    Dispense:  16 g    Refill:  6   Patient Instructions                       Medicare Annual Wellness Visit  [facility] and the medical providers at [facility] strive to bring you the best medical care.  In doing so we not only want to address your current  medical conditions and concerns but also to detect new conditions early and prevent illness, disease and health-related problems.    Medicare offers a yearly Wellness Visit which allows our clinical staff to assess your need for preventative services including immunizations, lifestyle education, counseling to decrease risk of preventable diseases and screening for fall risk and other medical concerns.    This visit is provided free of charge (no copay) for all Medicare recipients. The clinical pharmacists at [facility] have begun to conduct these Wellness Visits which will also include a thorough review of all your medications.    As you primary medical provider recommend that you make an appointment for your Annual Wellness Visit if you have not done so already this year.  You may set up this appointment before you leave today or you may call back ([phone]) and schedule an appointment.  Please make sure when you call that you mention that you are scheduling your Annual Wellness Visit with the clinical pharmacist so that the appointment may be made for the proper length of time.     Continue current medications. Continue good therapeutic lifestyle changes which  include good diet and exercise. Fall precautions discussed with patient. If an FOBT was given today- please return it to our front desk. If you are over 4 years old - you may need Prevnar 56 or the adult Pneumonia vaccine.  Please use the Valtrex as directed  Drink plenty of fluids Both nose sprays will be helpful in allergic rhinitis Continue to exercise regularly Always be careful and did not put yourself at a risk for falling   Be sure and get your eye exam as planned next month Arrie Senate MD

## 2014-04-14 NOTE — Addendum Note (Signed)
Addended by: Zannie Cove on: 04/14/2014 10:23 AM   Modules accepted: Orders

## 2014-04-15 LAB — NMR, LIPOPROFILE
Cholesterol: 194 mg/dL (ref 100–199)
HDL Cholesterol by NMR: 87 mg/dL (ref >39)
HDL Particle Number: 45.1 umol/L (ref >=30.5)
LDL PARTICLE NUMBER: 1043 nmol/L — AB (ref <1000)
LDL Size: 20.2 nm (ref >20.5)
LDLC SERPL CALC-MCNC: 89 mg/dL (ref 0–99)
LP-IR SCORE: 26 (ref <=45)
Small LDL Particle Number: 660 nmol/L — ABNORMAL HIGH (ref <=527)
Triglycerides by NMR: 92 mg/dL (ref 0–149)

## 2014-04-15 LAB — BMP8+EGFR
BUN/Creatinine Ratio: 15 (ref 11–26)
BUN: 14 mg/dL (ref 8–27)
CHLORIDE: 98 mmol/L (ref 97–108)
CO2: 27 mmol/L (ref 18–29)
Calcium: 10 mg/dL (ref 8.7–10.3)
Creatinine, Ser: 0.93 mg/dL (ref 0.57–1.00)
GFR calc Af Amer: 69 mL/min/{1.73_m2} (ref >59)
GFR calc non Af Amer: 59 mL/min/{1.73_m2} — ABNORMAL LOW (ref >59)
GLUCOSE: 90 mg/dL (ref 65–99)
POTASSIUM: 4.8 mmol/L (ref 3.5–5.2)
Sodium: 139 mmol/L (ref 134–144)

## 2014-04-15 LAB — HEPATIC FUNCTION PANEL
ALK PHOS: 36 IU/L — AB (ref 39–117)
ALT: 15 IU/L (ref 0–32)
AST: 25 IU/L (ref 0–40)
Albumin: 4.5 g/dL (ref 3.5–4.8)
Bilirubin, Direct: 0.17 mg/dL (ref 0.00–0.40)
TOTAL PROTEIN: 6.7 g/dL (ref 6.0–8.5)
Total Bilirubin: 0.6 mg/dL (ref 0.0–1.2)

## 2014-04-15 LAB — THYROID PANEL WITH TSH
FREE THYROXINE INDEX: 2.2 (ref 1.2–4.9)
T3 Uptake Ratio: 24 % (ref 24–39)
T4 TOTAL: 9.2 ug/dL (ref 4.5–12.0)
TSH: 0.862 u[IU]/mL (ref 0.450–4.500)

## 2014-04-15 LAB — MAGNESIUM: MAGNESIUM: 2.1 mg/dL (ref 1.6–2.6)

## 2014-04-15 LAB — VITAMIN D 25 HYDROXY (VIT D DEFICIENCY, FRACTURES): VIT D 25 HYDROXY: 42 ng/mL (ref 30.0–100.0)

## 2014-04-15 LAB — PHOSPHORUS: Phosphorus: 3.7 mg/dL (ref 2.5–4.5)

## 2014-04-17 ENCOUNTER — Telehealth: Payer: Self-pay | Admitting: Family Medicine

## 2014-04-20 ENCOUNTER — Encounter: Payer: Self-pay | Admitting: Pharmacist

## 2014-04-28 ENCOUNTER — Other Ambulatory Visit: Payer: Medicare Other | Admitting: Nurse Practitioner

## 2014-05-04 ENCOUNTER — Encounter: Payer: Self-pay | Admitting: Nurse Practitioner

## 2014-05-04 ENCOUNTER — Ambulatory Visit (INDEPENDENT_AMBULATORY_CARE_PROVIDER_SITE_OTHER): Payer: Medicare Other | Admitting: Nurse Practitioner

## 2014-05-04 VITALS — BP 116/71 | HR 72 | Temp 96.8°F | Ht 62.0 in | Wt 118.0 lb

## 2014-05-04 DIAGNOSIS — Z01419 Encounter for gynecological examination (general) (routine) without abnormal findings: Secondary | ICD-10-CM | POA: Diagnosis not present

## 2014-05-04 DIAGNOSIS — Z0000 Encounter for general adult medical examination without abnormal findings: Secondary | ICD-10-CM

## 2014-05-04 DIAGNOSIS — M810 Age-related osteoporosis without current pathological fracture: Secondary | ICD-10-CM

## 2014-05-04 DIAGNOSIS — Z124 Encounter for screening for malignant neoplasm of cervix: Secondary | ICD-10-CM | POA: Diagnosis not present

## 2014-05-04 DIAGNOSIS — K640 First degree hemorrhoids: Secondary | ICD-10-CM

## 2014-05-04 MED ORDER — DENOSUMAB 60 MG/ML ~~LOC~~ SOLN
60.0000 mg | Freq: Once | SUBCUTANEOUS | Status: AC
  Administered 2014-05-04: 60 mg via SUBCUTANEOUS

## 2014-05-04 NOTE — Addendum Note (Signed)
Addended by: Cherre Robins on: 05/04/2014 02:46 PM   Modules accepted: Orders

## 2014-05-04 NOTE — Progress Notes (Signed)
   Subjective:    Patient ID: Yolanda [name], female    DOB: 1935-06-01, 79 y.o.   MRN: [id number]  HPI Patient i today for a PAP- she is a regular patient of Dr. Laurance Flatten and was last seen by him 04/14/14. She has  No complaints today.    Review of Systems  Constitutional: Negative.   HENT: Negative.   Respiratory: Negative.   Cardiovascular: Negative.   Genitourinary: Negative.   Neurological: Negative.   Psychiatric/Behavioral: Negative.   All other systems reviewed and are negative.      Objective:   Physical Exam  Constitutional: She is oriented to person, place, and time. She appears well-developed and well-nourished.  HENT:  Head: Normocephalic.  Right Ear: Hearing, tympanic membrane, external ear and ear canal normal.  Left Ear: Hearing, tympanic membrane, external ear and ear canal normal.  Nose: Nose normal.  Mouth/Throat: Uvula is midline and oropharynx is clear and moist.  Eyes: Conjunctivae and EOM are normal. Pupils are equal, round, and reactive to light.  Neck: Normal range of motion and full passive range of motion without pain. Neck supple. No JVD present. Carotid bruit is not present. No mass and no thyromegaly present.  Cardiovascular: Normal rate, normal heart sounds and intact distal pulses.   No murmur heard. Pulmonary/Chest: Effort normal and breath sounds normal. Right breast exhibits no inverted nipple, no mass, no nipple discharge, no skin change and no tenderness. Left breast exhibits no inverted nipple, no mass, no nipple discharge, no skin change and no tenderness.  Abdominal: Soft. Bowel sounds are normal. She exhibits no mass. There is no tenderness.  Genitourinary: Vagina normal and uterus normal. No breast swelling, tenderness, discharge or bleeding.  bimanual exam-No adnexal masses or tenderness. Cervix stenoisi-  No discharge non thrombosed external hemorrhoids  Musculoskeletal: Normal range of motion.  Lymphadenopathy:    She has no cervical  adenopathy.  Neurological: She is alert and oriented to person, place, and time.  Skin: Skin is warm and dry.  Psychiatric: She has a normal mood and affect. Her behavior is normal. Judgment and thought content normal.    BP 116/71  Pulse 72  Temp(Src) 96.8 F (36 C) (Oral)  Ht 5\' 2"  (1.575 m)  Wt 118 lb (53.524 kg)  BMI 21.58 kg/m2       Assessment & Plan:   1. Annual physical exam   2. Encounter for routine gynecological examination   3. First degree hemorrhoids    Force fluids Stool softners Increase fiber in diet Keep follow up appointment with Dr. Mayra Neer, FNP

## 2014-05-04 NOTE — Addendum Note (Signed)
Addended by: Earlene Plater on: 05/04/2014 04:08 PM   Modules accepted: Orders

## 2014-05-04 NOTE — Patient Instructions (Signed)

## 2014-05-05 LAB — PAP IG (IMAGE GUIDED): PAP Smear Comment: 0

## 2014-05-12 DIAGNOSIS — H9319 Tinnitus, unspecified ear: Secondary | ICD-10-CM | POA: Diagnosis not present

## 2014-05-12 DIAGNOSIS — H903 Sensorineural hearing loss, bilateral: Secondary | ICD-10-CM | POA: Diagnosis not present

## 2014-05-13 ENCOUNTER — Encounter: Payer: Self-pay | Admitting: Pharmacist

## 2014-05-13 ENCOUNTER — Ambulatory Visit (INDEPENDENT_AMBULATORY_CARE_PROVIDER_SITE_OTHER): Payer: Medicare Other | Admitting: Pharmacist

## 2014-05-13 ENCOUNTER — Ambulatory Visit (INDEPENDENT_AMBULATORY_CARE_PROVIDER_SITE_OTHER): Payer: Medicare Other

## 2014-05-13 VITALS — BP 138/88 | HR 70 | Ht 62.0 in | Wt 115.0 lb

## 2014-05-13 DIAGNOSIS — M810 Age-related osteoporosis without current pathological fracture: Secondary | ICD-10-CM

## 2014-05-13 DIAGNOSIS — E559 Vitamin D deficiency, unspecified: Secondary | ICD-10-CM

## 2014-05-13 NOTE — Progress Notes (Signed)
Osteoporosis Clinic Current Height: Height: 5\' 2"  (157.5 cm)       Current Weight: Weight: 115 lb (52.164 kg)       Ethnicity:Caucasian  BP: BP: 138/88 mmHg     HR:  Pulse Rate: 70      HPI: Patient with osteoporosis currently taking Prolia 60mg  SQ every 6 months since 04/2012.  Has tolerated well.  Last injection was 04/2014  Med(s) previously tried for Osteoporosis/Osteopenia:  Took Forteo for 2 years;  Also took Actonel and fosamax in past but had treatment failure / continued decrease in BMD.                                                             PMH: Age at menopause:  60's Hysterectomy?  No Oophorectomy?  No HRT? No Steroid Use?  No Thyroid med?  No History of cancer?  No History of digestive disorders (ie Crohn's)?  No Current or previous eating disorders?  No Last Vitamin D Result:  42 (04/14/2014) Last GFR Result:  59 (04/14/2014)   FH/SH: Family history of osteoporosis?  No Parent with history of hip fracture?  No Family history of breast cancer?  No Exercise?  Yes - strength training twice a week and walking daily Smoking?  No Alcohol?  No    Calcium Assessment Calcium Intake  # of servings/day  Calcium mg  Milk (8 oz) 1  x  300  = 300mg   Yogurt (4 oz) 0 x  200 = 0  Cheese (1 oz) 0 x  200 = 0  Other Calcium sources   250mg   Ca supplement 500mg  bid = 1000mg    Estimated calcium intake per day 1450mg     DEXA Results Date of Test T-Score for AP Spine L1-L4 T-Score for Total Left Hip T-Score for Total Right Hip  05/13/2014 -1.8 -1.0 -1.2  02/14/2012 -2.0 -1.8 -1.5  08/11/2009 -2.4 -1.6 -1.6  05/13/2007 -2.2 -1.5 -1.7    Lowest T-Score in spine = -2.7 at L1-L4 (04/24/2005) Lowest T-Score in hip = -2.8 at neck of left hip (02/14/2012)  Assessment: Osteopenia with improved BMD since starting Prolia  Recommendations: 1.  Continue  Prolia 60mg  SQ q 6 months 2.  continue calcium 1200mg  daily through supplementation or diet.  3.  continue weight  bearing exercise - 30 minutes at least 4 days per week.   4.  Counseled and educated about fall risk and prevention.  Recheck DEXA:  2 years  Time spent counseling patient:  25 minutes  Cherre Robins, PharmD, CPP

## 2014-05-13 NOTE — Patient Instructions (Signed)

## 2014-05-26 ENCOUNTER — Telehealth: Payer: Self-pay | Admitting: Family Medicine

## 2014-05-26 NOTE — Telephone Encounter (Signed)
Please call a prescription in for linzess 125 mcg by mouth daily 30 minutes before the first meal #30. Make sure that the patient is up-to-date on her colonoscopies and FOBT before calling this prescription in. This is prescription is for chronic idiopathic constipation

## 2014-05-26 NOTE — Telephone Encounter (Signed)
Patient is having problems with constipation and wants to know if you will call in a different med other than miralax. It is not working. I gave her an appt for tomorrow per her request but if you will call in med she will cancel

## 2014-05-27 ENCOUNTER — Ambulatory Visit (INDEPENDENT_AMBULATORY_CARE_PROVIDER_SITE_OTHER): Payer: Medicare Other

## 2014-05-27 ENCOUNTER — Ambulatory Visit (INDEPENDENT_AMBULATORY_CARE_PROVIDER_SITE_OTHER): Payer: Medicare Other | Admitting: Family Medicine

## 2014-05-27 ENCOUNTER — Encounter: Payer: Self-pay | Admitting: Family Medicine

## 2014-05-27 VITALS — BP 142/73 | HR 72 | Temp 98.0°F | Ht 62.0 in | Wt 118.0 lb

## 2014-05-27 DIAGNOSIS — I2510 Atherosclerotic heart disease of native coronary artery without angina pectoris: Secondary | ICD-10-CM

## 2014-05-27 DIAGNOSIS — K5904 Chronic idiopathic constipation: Secondary | ICD-10-CM

## 2014-05-27 DIAGNOSIS — K5909 Other constipation: Secondary | ICD-10-CM

## 2014-05-27 NOTE — Progress Notes (Signed)
Subjective:    Patient ID: Yolanda [name], female    DOB: 09-12-1935, 79 y.o.   MRN: [id number]  HPI Patient here today for constipation. This started last week while on vacation. She is having both abdominal pain and rectal pain. She appears to even have some rectal leakage. The MiraLax and she has been taking in the past is not working. She has not had a good bowel movement in over a week. Her last hemoglobin was good at 14.4.          Patient Active Problem List   Diagnosis Date Noted  . Osteoporosis, postmenopausal 10/16/2013  . Allergic rhinitis 08/20/2013  . Vitamin D deficiency 08/20/2013  . GERD (gastroesophageal reflux disease) 08/20/2013  . THYROID NODULE 01/11/2009  . HYPERLIPIDEMIA-MIXED 01/11/2009  . ANEMIA 01/11/2009  . HYPERTENSION, UNSPECIFIED 01/11/2009  . CAD, UNSPECIFIED SITE 01/11/2009  . CAROTID STENOSIS 01/11/2009   Outpatient Encounter Prescriptions as of 05/27/2014  Medication Sig  . acyclovir ointment (ZOVIRAX) 5 % Apply 1 application topically every 3 (three) hours. Apply frequently and sparingly to cold sores as needed.  Marland Kitchen aspirin EC 81 MG tablet Take 81 mg by mouth daily.  Marland Kitchen azelastine (ASTELIN) 0.1 % nasal spray Place 1 spray into both nostrils 2 (two) times daily. Use in each nostril as directed  . calcium carbonate 200 MG capsule Take 1 capsule (200 mg total) by mouth daily.  . Cholecalciferol (VITAMIN D3) 1000 UNITS CAPS Take by mouth 2 (two) times daily.    . CO ENZYME Q-10 PO Take by mouth.    . colesevelam (WELCHOL) 625 MG tablet Take 3 tablets (1,875 mg total) by mouth 2 (two) times daily with a meal. Take one tablet by mouth three times a day  . denosumab (PROLIA) 60 MG/ML SOLN injection Inject 60 mg into the skin every 6 (six) months. Administer in upper arm, thigh, or abdomen  . fluticasone (FLONASE) 50 MCG/ACT nasal spray 1-2 sprays each nostril daily as directed for allergic rhinitis  . hydrochlorothiazide (MICROZIDE) 12.5 MG capsule Take  1 capsule (12.5 mg total) by mouth daily.  . IRON PO Take by mouth every other day.    . Multiple Vitamin (MULTI-VITAMIN PO) Take by mouth.    . omega-3 acid ethyl esters (LOVAZA) 1 G capsule Take 2 capsules (2 g total) by mouth 2 (two) times daily.  . pantoprazole (PROTONIX) 40 MG tablet TAKE 1 TABLET DAILY  . potassium chloride (KLOR-CON 10) 10 MEQ tablet Take 1 tablet (10 mEq total) by mouth daily.  . pravastatin (PRAVACHOL) 80 MG tablet TAKE 1 TABLET DAILY  . valACYclovir (VALTREX) 1000 MG tablet Take 1 tablet (1,000 mg total) by mouth daily. As directed    Review of Systems  Constitutional: Negative.   HENT: Negative.   Eyes: Negative.   Respiratory: Negative.   Cardiovascular: Negative.   Gastrointestinal: Positive for abdominal pain, constipation and rectal pain. Negative for nausea and vomiting. Diarrhea: some leaking - liquid   Endocrine: Negative.   Genitourinary: Negative.   Musculoskeletal: Negative.   Skin: Negative.   Allergic/Immunologic: Negative.   Neurological: Negative.   Hematological: Negative.   Psychiatric/Behavioral: Negative.        Objective:   Physical Exam  Nursing note and vitals reviewed. Constitutional: She is oriented to person, place, and time. She appears well-developed and well-nourished. No distress.  Abdominal: Soft. Bowel sounds are normal. She exhibits no distension and no mass. There is no tenderness. There is no rebound and  no guarding.  Normal bowel sounds  Neurological: She is alert and oriented to person, place, and time.  Skin: Skin is warm and dry. No rash noted.  Psychiatric: She has a normal mood and affect. Her behavior is normal. Judgment and thought content normal.   BP 142/73  Pulse 72  Temp(Src) 98 F (36.7 C) (Oral)  Ht 5\' 2"  (1.575 m)  Wt 118 lb (53.524 kg)  BMI 21.58 kg/m2        Assessment & Plan:  1. Constipation - functional  2. Chronic constipation Patient Instructions  Constipation Constipation is  when a person has fewer than three bowel movements a week, has difficulty having a bowel movement, or has stools that are dry, hard, or larger than normal. As people grow older, constipation is more common. If you try to fix constipation with medicines that make you have a bowel movement (laxatives), the problem may get worse. Long-term laxative use may cause the muscles of the colon to become weak. A low-fiber diet, not taking in enough fluids, and taking certain medicines may make constipation worse.  CAUSES   Certain medicines, such as antidepressants, pain medicine, iron supplements, antacids, and water pills.   Certain diseases, such as diabetes, irritable bowel syndrome (IBS), thyroid disease, or depression.   Not drinking enough water.   Not eating enough fiber-rich foods.   Stress or travel.   Lack of physical activity or exercise.   Ignoring the urge to have a bowel movement.   Using laxatives too much.  SIGNS AND SYMPTOMS   Having fewer than three bowel movements a week.   Straining to have a bowel movement.   Having stools that are hard, dry, or larger than normal.   Feeling full or bloated.   Pain in the lower abdomen.   Not feeling relief after having a bowel movement.  DIAGNOSIS  Your health care provider will take a medical history and perform a physical exam. Further testing may be done for severe constipation. Some tests may include:  A barium enema X-ray to examine your rectum, colon, and, sometimes, your small intestine.   A sigmoidoscopy to examine your lower colon.   A colonoscopy to examine your entire colon. TREATMENT  Treatment will depend on the severity of your constipation and what is causing it. Some dietary treatments include drinking more fluids and eating more fiber-rich foods. Lifestyle treatments may include regular exercise. If these diet and lifestyle recommendations do not help, your health care provider may recommend taking  over-the-counter laxative medicines to help you have bowel movements. Prescription medicines may be prescribed if over-the-counter medicines do not work.  HOME CARE INSTRUCTIONS   Eat foods that have a lot of fiber, such as fruits, vegetables, whole grains, and beans.  Limit foods high in fat and processed sugars, such as french fries, hamburgers, cookies, candies, and soda.   A fiber supplement may be added to your diet if you cannot get enough fiber from foods.   Drink enough fluids to keep your urine clear or pale yellow.   Exercise regularly or as directed by your health care provider.   Go to the restroom when you have the urge to go. Do not hold it.   Only take over-the-counter or prescription medicines as directed by your health care provider. Do not take other medicines for constipation without talking to your health care provider first.  [place] IF:   You have bright red blood in your stool.  Your constipation lasts for more than 4 days or gets worse.   You have abdominal or rectal pain.   You have thin, pencil-like stools.   You have unexplained weight loss. MAKE SURE YOU:   Understand these instructions.  Will watch your condition.  Will get help right away if you are not doing well or get worse. Document Released: 06/16/2004 Document Revised: 09/23/2013 Document Reviewed: 06/30/2013 [facility] Patient Information 2015 [place], [place]. This information is not intended to replace advice given to you by your health care provider. Make sure you discuss any questions you have with your health care provider.  Take Dulcolax tablets over-the-counter, take 2-3 tablets with a large glass of water About 45 minutes or an hour later use a fleets enema Continue to drink plenty of fluids Call us tomorrow regarding progress with this treatment   Arrie Senate MD

## 2014-05-27 NOTE — Addendum Note (Signed)
Addended by: Zannie Cove on: 05/27/2014 03:45 PM   Modules accepted: Orders

## 2014-05-27 NOTE — Patient Instructions (Addendum)
Constipation  Constipation is when a person has fewer than three bowel movements a week, has difficulty having a bowel movement, or has stools that are dry, hard, or larger than normal. As people grow older, constipation is more common. If you try to fix constipation with medicines that make you have a bowel movement (laxatives), the problem may get worse. Long-term laxative use may cause the muscles of the colon to become weak. A low-fiber diet, not taking in enough fluids, and taking certain medicines may make constipation worse.   CAUSES   · Certain medicines, such as antidepressants, pain medicine, iron supplements, antacids, and water pills.    · Certain diseases, such as diabetes, irritable bowel syndrome (IBS), thyroid disease, or depression.    · Not drinking enough water.    · Not eating enough fiber-rich foods.    · Stress or travel.    · Lack of physical activity or exercise.    · Ignoring the urge to have a bowel movement.    · Using laxatives too much.    SIGNS AND SYMPTOMS   · Having fewer than three bowel movements a week.    · Straining to have a bowel movement.    · Having stools that are hard, dry, or larger than normal.    · Feeling full or bloated.    · Pain in the lower abdomen.    · Not feeling relief after having a bowel movement.    DIAGNOSIS   Your health care provider will take a medical history and perform a physical exam. Further testing may be done for severe constipation. Some tests may include:  · A barium enema X-ray to examine your rectum, colon, and, sometimes, your small intestine.    · A sigmoidoscopy to examine your lower colon.    · A colonoscopy to examine your entire colon.  TREATMENT   Treatment will depend on the severity of your constipation and what is causing it. Some dietary treatments include drinking more fluids and eating more fiber-rich foods. Lifestyle treatments may include regular exercise. If these diet and lifestyle recommendations do not help, your health care  provider may recommend taking over-the-counter laxative medicines to help you have bowel movements. Prescription medicines may be prescribed if over-the-counter medicines do not work.   HOME CARE INSTRUCTIONS   · Eat foods that have a lot of fiber, such as fruits, vegetables, whole grains, and beans.  · Limit foods high in fat and processed sugars, such as french fries, hamburgers, cookies, candies, and soda.    · A fiber supplement may be added to your diet if you cannot get enough fiber from foods.    · Drink enough fluids to keep your urine clear or pale yellow.    · Exercise regularly or as directed by your health care provider.    · Go to the restroom when you have the urge to go. Do not hold it.    · Only take over-the-counter or prescription medicines as directed by your health care provider. Do not take other medicines for constipation without talking to your health care provider first.    SEEK IMMEDIATE MEDICAL CARE IF:   · You have bright red blood in your stool.    · Your constipation lasts for more than 4 days or gets worse.    · You have abdominal or rectal pain.    · You have thin, pencil-like stools.    · You have unexplained weight loss.  MAKE SURE YOU:   · Understand these instructions.  · Will watch your condition.  · Will get help right away if you are not   you have with your health care provider.  Take Dulcolax tablets over-the-counter, take 2-3 tablets with a large glass of water About 45 minutes or an hour later use a fleets enema Continue to drink plenty of fluids Call us tomorrow regarding progress with this treatment

## 2014-05-27 NOTE — Telephone Encounter (Signed)
Pt has appt today with Dr. Laurance Flatten to discuss constipation. No Meds called in. Will wait for appt to discuss with dr. Laurance Flatten.

## 2014-05-28 ENCOUNTER — Telehealth: Payer: Self-pay | Admitting: Family Medicine

## 2014-05-28 NOTE — Telephone Encounter (Signed)
5 milk of magnesia 45 cc with 6 cc of cascara, if that doesn't work try magnesium citrate

## 2014-05-28 NOTE — Telephone Encounter (Signed)
Pt aware.

## 2014-06-01 ENCOUNTER — Other Ambulatory Visit: Payer: Self-pay | Admitting: Nurse Practitioner

## 2014-06-10 DIAGNOSIS — H04129 Dry eye syndrome of unspecified lacrimal gland: Secondary | ICD-10-CM | POA: Diagnosis not present

## 2014-06-10 DIAGNOSIS — H18519 Endothelial corneal dystrophy, unspecified eye: Secondary | ICD-10-CM | POA: Diagnosis not present

## 2014-06-10 DIAGNOSIS — Z961 Presence of intraocular lens: Secondary | ICD-10-CM | POA: Diagnosis not present

## 2014-07-12 ENCOUNTER — Other Ambulatory Visit: Payer: Self-pay | Admitting: Nurse Practitioner

## 2014-07-17 ENCOUNTER — Telehealth: Payer: Self-pay | Admitting: Family Medicine

## 2014-07-17 NOTE — Telephone Encounter (Signed)
Rx was sent in to pharmacy on 07-14-14. Confirmed receipt from pharmacy. Left message on patients voicemail that rx has been sent and to call back with any further questions

## 2014-07-27 DIAGNOSIS — Z23 Encounter for immunization: Secondary | ICD-10-CM | POA: Diagnosis not present

## 2014-08-14 ENCOUNTER — Telehealth: Payer: Self-pay | Admitting: Gastroenterology

## 2014-08-14 ENCOUNTER — Other Ambulatory Visit: Payer: Self-pay | Admitting: Family Medicine

## 2014-08-14 ENCOUNTER — Ambulatory Visit (INDEPENDENT_AMBULATORY_CARE_PROVIDER_SITE_OTHER): Payer: Medicare Other | Admitting: Family Medicine

## 2014-08-14 ENCOUNTER — Encounter: Payer: Self-pay | Admitting: Family Medicine

## 2014-08-14 VITALS — BP 136/71 | HR 67 | Temp 96.4°F | Ht 62.0 in | Wt 116.0 lb

## 2014-08-14 DIAGNOSIS — K5900 Constipation, unspecified: Secondary | ICD-10-CM

## 2014-08-14 DIAGNOSIS — R42 Dizziness and giddiness: Secondary | ICD-10-CM | POA: Diagnosis not present

## 2014-08-14 DIAGNOSIS — I2510 Atherosclerotic heart disease of native coronary artery without angina pectoris: Secondary | ICD-10-CM | POA: Diagnosis not present

## 2014-08-14 DIAGNOSIS — E785 Hyperlipidemia, unspecified: Secondary | ICD-10-CM | POA: Diagnosis not present

## 2014-08-14 DIAGNOSIS — M810 Age-related osteoporosis without current pathological fracture: Secondary | ICD-10-CM

## 2014-08-14 DIAGNOSIS — K5909 Other constipation: Secondary | ICD-10-CM

## 2014-08-14 DIAGNOSIS — K219 Gastro-esophageal reflux disease without esophagitis: Secondary | ICD-10-CM | POA: Diagnosis not present

## 2014-08-14 DIAGNOSIS — E559 Vitamin D deficiency, unspecified: Secondary | ICD-10-CM | POA: Diagnosis not present

## 2014-08-14 DIAGNOSIS — H8309 Labyrinthitis, unspecified ear: Secondary | ICD-10-CM | POA: Diagnosis not present

## 2014-08-14 DIAGNOSIS — I10 Essential (primary) hypertension: Secondary | ICD-10-CM

## 2014-08-14 LAB — POCT CBC
Granulocyte percent: 54.3 %G (ref 37–80)
HCT, POC: 43.2 % (ref 37.7–47.9)
HEMOGLOBIN: 14.7 g/dL (ref 12.2–16.2)
Lymph, poc: 2.8 (ref 0.6–3.4)
MCH, POC: 32 pg — AB (ref 27–31.2)
MCHC: 34.1 g/dL (ref 31.8–35.4)
MCV: 93.8 fL (ref 80–97)
MPV: 7 fL (ref 0–99.8)
POC Granulocyte: 3.7 (ref 2–6.9)
POC LYMPH %: 40.9 % (ref 10–50)
Platelet Count, POC: 177 10*3/uL (ref 142–424)
RBC: 4.6 M/uL (ref 4.04–5.48)
RDW, POC: 12.5 %
WBC: 6.9 10*3/uL (ref 4.6–10.2)

## 2014-08-14 MED ORDER — MECLIZINE HCL 12.5 MG PO TABS: ORAL_TABLET | ORAL | Status: DC

## 2014-08-14 MED ORDER — PANTOPRAZOLE SODIUM 40 MG PO TBEC: DELAYED_RELEASE_TABLET | ORAL | Status: DC

## 2014-08-14 MED ORDER — HYDROCHLOROTHIAZIDE 12.5 MG PO CAPS
12.5000 mg | ORAL_CAPSULE | Freq: Every day | ORAL | Status: DC
Start: 2014-08-14 — End: 2015-07-16

## 2014-08-14 NOTE — Telephone Encounter (Signed)
If she's never had a colonoscopy I think it is reasonable.provided that her overall health is good

## 2014-08-14 NOTE — Telephone Encounter (Signed)
I have left message for the patient to call back 

## 2014-08-14 NOTE — Progress Notes (Signed)
Subjective:    Patient ID: Yolanda [name], female    DOB: [birth year], 79 y.o.   MRN: [id number]  HPI Pt here for follow up and management of chronic medical problems. The patient complains of dizziness especially when going from a supine position to a sitting position. She has no other specific complaints. She is due to get lab work today and also to return the FOBT. The patient has had the dizziness since August and since a trip to [place]. It is worse with moving her head up and down from side to side. Her last cholesterol numbers were also elevated and we're not sure why, she is questioning the injectable medicine for osteoporosis --prolia.       Patient Active Problem List   Diagnosis Date Noted  . Osteoporosis, postmenopausal 10/16/2013  . Allergic rhinitis 08/20/2013  . Vitamin D deficiency 08/20/2013  . GERD (gastroesophageal reflux disease) 08/20/2013  . THYROID NODULE 01/11/2009  . HYPERLIPIDEMIA-MIXED 01/11/2009  . ANEMIA 01/11/2009  . HYPERTENSION, UNSPECIFIED 01/11/2009  . CAD, UNSPECIFIED SITE 01/11/2009  . CAROTID STENOSIS 01/11/2009   Outpatient Encounter Prescriptions as of 08/14/2014  Medication Sig  . acyclovir ointment (ZOVIRAX) 5 % Apply 1 application topically every 3 (three) hours. Apply frequently and sparingly to cold sores as needed.  Marland Kitchen aspirin EC 81 MG tablet Take 81 mg by mouth daily.  Marland Kitchen azelastine (ASTELIN) 0.1 % nasal spray Place 1 spray into both nostrils 2 (two) times daily. Use in each nostril as directed  . calcium carbonate 200 MG capsule Take 1 capsule (200 mg total) by mouth daily.  . Cholecalciferol (VITAMIN D3) 1000 UNITS CAPS Take by mouth 2 (two) times daily.    . CO ENZYME Q-10 PO Take by mouth.    . colesevelam (WELCHOL) 625 MG tablet Take 3 tablets (1,875 mg total) by mouth 2 (two) times daily with a meal. Take one tablet by mouth three times a day  . denosumab (PROLIA) 60 MG/ML SOLN injection Inject 60 mg into the skin every 6 (six)  months. Administer in upper arm, thigh, or abdomen  . fluticasone (FLONASE) 50 MCG/ACT nasal spray 1-2 sprays each nostril daily as directed for allergic rhinitis  . hydrochlorothiazide (MICROZIDE) 12.5 MG capsule Take 1 capsule (12.5 mg total) by mouth daily.  . IRON PO Take by mouth every other day.    . Multiple Vitamin (MULTI-VITAMIN PO) Take by mouth.    . omega-3 acid ethyl esters (LOVAZA) 1 G capsule Take 2 capsules (2 g total) by mouth 2 (two) times daily.  . pantoprazole (PROTONIX) 40 MG tablet TAKE 1 TABLET DAILY  . potassium chloride (KLOR-CON 10) 10 MEQ tablet Take 1 tablet (10 mEq total) by mouth daily.  . pravastatin (PRAVACHOL) 80 MG tablet TAKE 1 TABLET DAILY  . [DISCONTINUED] pravastatin (PRAVACHOL) 80 MG tablet TAKE 1 TABLET DAILY  . valACYclovir (VALTREX) 1000 MG tablet Take 1 tablet (1,000 mg total) by mouth daily. As directed    Review of Systems  Constitutional: Negative.   HENT: Negative.   Eyes: Negative.   Respiratory: Negative.   Cardiovascular: Negative.   Gastrointestinal: Negative.   Endocrine: Negative.   Genitourinary: Negative.   Musculoskeletal: Negative.   Skin: Negative.   Allergic/Immunologic: Negative.   Neurological: Positive for dizziness (when going from laying to sitting) and light-headedness.  Hematological: Negative.   Psychiatric/Behavioral: Negative.        Objective:   Physical Exam  Constitutional: She is oriented to person, place, and  time. She appears well-developed and well-nourished. No distress.  HENT:  Head: Normocephalic and atraumatic.  Right Ear: External ear normal.  Left Ear: External ear normal.  Nose: Nose normal.  Mouth/Throat: Oropharynx is clear and moist.  TMs were normal and ear canals were clear  Eyes: Conjunctivae and EOM are normal. Pupils are equal, round, and reactive to light. Right eye exhibits no discharge. Left eye exhibits no discharge. No scleral icterus.  Neck: Normal range of motion. Neck supple.  No thyromegaly present.  No carotid bruits and no anterior cervical adenopathy  Cardiovascular: Normal rate, regular rhythm, normal heart sounds and intact distal pulses.   No murmur heard. At 72/m  Pulmonary/Chest: Effort normal and breath sounds normal. No respiratory distress. She has no wheezes. She has no rales. She exhibits no tenderness.  Clear anteriorly and posteriorly  Abdominal: Soft. Bowel sounds are normal. She exhibits no mass. There is no tenderness. There is no rebound and no guarding.  Musculoskeletal: Normal range of motion. She exhibits no edema.  Lymphadenopathy:    She has no cervical adenopathy.  Neurological: She is alert and oriented to person, place, and time. She has normal reflexes.  Skin: Skin is warm and dry. No rash noted.  Psychiatric: She has a normal mood and affect. Her behavior is normal. Judgment and thought content normal.  Nursing note and vitals reviewed.  BP 136/71 mmHg  Pulse 67  Temp(Src) 96.4 F (35.8 C) (Oral)  Ht _0  (1.575 m)  Wt 116 lb (52.617 kg)  BMI 21.21 kg/m2        Assessment & Plan:  1. Vitamin D deficiency - POCT CBC - Vit D  25 hydroxy (rtn osteoporosis monitoring)  2. Gastroesophageal reflux disease, esophagitis presence not specified - POCT CBC  3. Essential hypertension - POCT CBC - BMP8+EGFR - Hepatic function panel  4. Hyperlipemia - POCT CBC - NMR, lipoprofile  5. Chronic constipation  6. Dizziness and giddiness - meclizine (ANTIVERT) 12.5 MG tablet; Take one 3 times daily with food for 7-10 days  Dispense: 30 tablet; Refill: 0  7. Labyrinthitis, unspecified laterality - meclizine (ANTIVERT) 12.5 MG tablet; Take one 3 times daily with food for 7-10 days  Dispense: 30 tablet; Refill: 0  8. Osteoporosis -continue with prolia injections  Meds ordered this encounter  Medications  . pantoprazole (PROTONIX) 40 MG tablet    Sig: TAKE 1 TABLET DAILY    Dispense:  90 tablet    Refill:  3  .  hydrochlorothiazide (MICROZIDE) 12.5 MG capsule    Sig: Take 1 capsule (12.5 mg total) by mouth daily.    Dispense:  90 capsule    Refill:  3  . meclizine (ANTIVERT) 12.5 MG tablet    Sig: Take one 3 times daily with food for 7-10 days    Dispense:  30 tablet    Refill:  0   Patient Instructions                       Medicare Annual Wellness Visit   and the medical providers at [facility] strive to bring you the best medical care.  In doing so we not only want to address your current medical conditions and concerns but also to detect new conditions early and prevent illness, disease and health-related problems.    Medicare offers a yearly Wellness Visit which allows our clinical staff to assess your need for preventative services including immunizations, lifestyle education,  counseling to decrease risk of preventable diseases and screening for fall risk and other medical concerns.    This visit is provided free of charge (no copay) for all Medicare recipients. The clinical pharmacists at [facility] have begun to conduct these Wellness Visits which will also include a thorough review of all your medications.    As you primary medical provider recommend that you make an appointment for your Annual Wellness Visit if you have not done so already this year.  You may set up this appointment before you leave today or you may call back ([phone]) and schedule an appointment.  Please make sure when you call that you mention that you are scheduling your Annual Wellness Visit with the clinical pharmacist so that the appointment may be made for the proper length of time.     Continue current medications. Continue good therapeutic lifestyle changes which include good diet and exercise. Fall precautions discussed with patient. If an FOBT was given today- please return it to our front desk. If you are over 84 years old - you may need Prevnar 8 or  the adult Pneumonia vaccine.  Flu Shots will be available at our office starting mid- September. Please call and schedule a FLU CLINIC APPOINTMENT.   Please use the Flonase more regularly Use saline nose spray frequently during the day Drink more fluids Take meclizine regularly for 7-10 days If the dizziness persists after 2-3 weeks we will arrange for you to see an ear nose and throat specialist Check your blood pressures at home when you're not feeling dizzy and when you are feeling dizzy Continue to be careful and do not put herself at risk for falling, don't climb We will check with the gastroenterologist regarding your need for a colonoscopy in light of your constipation. Keep the house cooler in the wintertime so you do not get dried out too much   Arrie Senate MD

## 2014-08-14 NOTE — Patient Instructions (Addendum)
Medicare Annual Wellness Visit  [facility] and the medical providers at [facility] strive to bring you the best medical care.  In doing so we not only want to address your current medical conditions and concerns but also to detect new conditions early and prevent illness, disease and health-related problems.    Medicare offers a yearly Wellness Visit which allows our clinical staff to assess your need for preventative services including immunizations, lifestyle education, counseling to decrease risk of preventable diseases and screening for fall risk and other medical concerns.    This visit is provided free of charge (no copay) for all Medicare recipients. The clinical pharmacists at [facility] have begun to conduct these Wellness Visits which will also include a thorough review of all your medications.    As you primary medical provider recommend that you make an appointment for your Annual Wellness Visit if you have not done so already this year.  You may set up this appointment before you leave today or you may call back ([phone]) and schedule an appointment.  Please make sure when you call that you mention that you are scheduling your Annual Wellness Visit with the clinical pharmacist so that the appointment may be made for the proper length of time.     Continue current medications. Continue good therapeutic lifestyle changes which include good diet and exercise. Fall precautions discussed with patient. If an FOBT was given today- please return it to our front desk. If you are over 78 years old - you may need Prevnar 28 or the adult Pneumonia vaccine.  Flu Shots will be available at our office starting mid- September. Please call and schedule a FLU CLINIC APPOINTMENT.   Please use the Flonase more regularly Use saline nose spray frequently during the day Drink more fluids Take meclizine regularly for 7-10 days If the  dizziness persists after 2-3 weeks we will arrange for you to see an ear nose and throat specialist Check your blood pressures at home when you're not feeling dizzy and when you are feeling dizzy Continue to be careful and do not put herself at risk for falling, don't climb We will check with the gastroenterologist regarding your need for a colonoscopy in light of your constipation. Keep the house cooler in the wintertime so you do not get dried out too much

## 2014-08-14 NOTE — Telephone Encounter (Signed)
The patient is calling because her PCP told to to ask if she needed a screening colonoscopy. She does not have complaints of constipation. She reports she eats fruit and gets better.

## 2014-08-14 NOTE — Telephone Encounter (Signed)
done

## 2014-08-17 ENCOUNTER — Telehealth: Payer: Self-pay | Admitting: Family Medicine

## 2014-08-17 NOTE — Telephone Encounter (Signed)
Called pt, it is all good, rx ready at [facility]

## 2014-08-20 LAB — HEPATIC FUNCTION PANEL
ALT: 14 IU/L (ref 0–32)
AST: 29 IU/L (ref 0–40)
Albumin: 4.3 g/dL (ref 3.5–4.8)
Alkaline Phosphatase: 37 IU/L — ABNORMAL LOW (ref 39–117)
BILIRUBIN DIRECT: 0.13 mg/dL (ref 0.00–0.40)
TOTAL PROTEIN: 6.7 g/dL (ref 6.0–8.5)
Total Bilirubin: 0.6 mg/dL (ref 0.0–1.2)

## 2014-08-20 LAB — BMP8+EGFR
BUN/Creatinine Ratio: 17 (ref 11–26)
BUN: 18 mg/dL (ref 8–27)
CALCIUM: 9.9 mg/dL (ref 8.7–10.3)
CO2: 26 mmol/L (ref 18–29)
CREATININE: 1.06 mg/dL — AB (ref 0.57–1.00)
Chloride: 101 mmol/L (ref 97–108)
GFR calc Af Amer: 59 mL/min/{1.73_m2} — ABNORMAL LOW (ref >59)
GFR, EST NON AFRICAN AMERICAN: 51 mL/min/{1.73_m2} — AB (ref >59)
Glucose: 88 mg/dL (ref 65–99)
POTASSIUM: 5.5 mmol/L — AB (ref 3.5–5.2)
Sodium: 139 mmol/L (ref 134–144)

## 2014-08-20 LAB — NMR, LIPOPROFILE

## 2014-08-20 LAB — VITAMIN D 25 HYDROXY (VIT D DEFICIENCY, FRACTURES): Vit D, 25-Hydroxy: 46.5 ng/mL (ref 30.0–100.0)

## 2014-08-20 LAB — SPECIMEN STATUS REPORT

## 2014-08-21 ENCOUNTER — Telehealth: Payer: Self-pay

## 2014-08-21 NOTE — Telephone Encounter (Signed)
Lm on home am as per DPR of 12/2012 with complete lab results and instructions for repeat nonfasting BMP in 2 weeks

## 2014-08-21 NOTE — Telephone Encounter (Signed)
-----   Message from Chipper Herb, MD sent at 08/20/2014  2:21 PM EST ----- The blood sugar is good at 88. The creatinine the most important kidney function test is slightly elevated and this is consistent with past readings. The electrolytes are within normal limits except the potassium is also slightly elevated. This BMP should be repeated nonfasting and a couple of weeks. All liver function tests are within normal limits The vitamin D level is good at 46.5, continue current treatment Lab-----does the lipid panel need to be repeated?

## 2014-08-25 NOTE — Telephone Encounter (Signed)
Letter mailed to the patient

## 2014-09-09 ENCOUNTER — Other Ambulatory Visit (INDEPENDENT_AMBULATORY_CARE_PROVIDER_SITE_OTHER): Payer: Medicare Other

## 2014-09-09 DIAGNOSIS — E785 Hyperlipidemia, unspecified: Secondary | ICD-10-CM

## 2014-09-09 DIAGNOSIS — I10 Essential (primary) hypertension: Secondary | ICD-10-CM

## 2014-09-10 LAB — NMR, LIPOPROFILE
Cholesterol: 193 mg/dL (ref 100–199)
HDL Cholesterol by NMR: 87 mg/dL (ref >39)
HDL Particle Number: 47.3 umol/L (ref >=30.5)
LDL Particle Number: 1009 nmol/L — ABNORMAL HIGH (ref <1000)
LDL Size: 20.6 nm (ref >20.5)
LDL-C: 89 mg/dL (ref 0–99)
Small LDL Particle Number: 405 nmol/L (ref <=527)
Triglycerides by NMR: 87 mg/dL (ref 0–149)

## 2014-09-10 LAB — POTASSIUM: POTASSIUM: 3.9 mmol/L (ref 3.5–5.2)

## 2014-09-14 ENCOUNTER — Telehealth: Payer: Self-pay | Admitting: *Deleted

## 2014-09-14 NOTE — Telephone Encounter (Signed)
-----   Message from Chipper Herb, MD sent at 09/10/2014  9:43 PM EST ----- The total LDL particle number with advanced lipid testing is slightly elevated at 1009. The LDL C is good at 89. The triglycerides are good at 87. The good cholesterol or the HDL particle number is very good 47.3. This total LDL particle number is definitely higher than it was 9 months ago. Please make sure that the patient is taking her cholesterol lowering medicine regularly and exercising and watching her diet as closely as possible. Also the repeat potassium is well within the normal range----continue current potassium treatment as doing, no further checkup until the next visit.

## 2014-09-14 NOTE — Telephone Encounter (Signed)
Aware of lab results  

## 2014-09-24 ENCOUNTER — Other Ambulatory Visit: Payer: Self-pay | Admitting: Nurse Practitioner

## 2014-10-05 ENCOUNTER — Encounter: Payer: Self-pay | Admitting: *Deleted

## 2014-10-15 ENCOUNTER — Ambulatory Visit (AMBULATORY_SURGERY_CENTER): Payer: Self-pay | Admitting: *Deleted

## 2014-10-15 VITALS — Ht 62.0 in | Wt 119.0 lb

## 2014-10-15 DIAGNOSIS — Z1211 Encounter for screening for malignant neoplasm of colon: Secondary | ICD-10-CM

## 2014-10-15 MED ORDER — NA SULFATE-K SULFATE-MG SULF 17.5-3.13-1.6 GM/177ML PO SOLN: 1.0000 | Freq: Once | ORAL | Status: DC

## 2014-10-15 NOTE — Progress Notes (Signed)
No egg or soy allergy. No anesthesia problems.  No home O2.  No diet meds.  Gave pt sample kit of suprep.

## 2014-10-16 ENCOUNTER — Encounter: Payer: Self-pay | Admitting: Gastroenterology

## 2014-10-22 ENCOUNTER — Ambulatory Visit (AMBULATORY_SURGERY_CENTER): Payer: Medicare Other | Admitting: Gastroenterology

## 2014-10-22 ENCOUNTER — Encounter: Payer: Self-pay | Admitting: Gastroenterology

## 2014-10-22 VITALS — BP 138/76 | HR 57 | Temp 97.5°F | Resp 45 | Ht 62.0 in | Wt 116.0 lb

## 2014-10-22 DIAGNOSIS — D124 Benign neoplasm of descending colon: Secondary | ICD-10-CM

## 2014-10-22 DIAGNOSIS — I2510 Atherosclerotic heart disease of native coronary artery without angina pectoris: Secondary | ICD-10-CM | POA: Diagnosis not present

## 2014-10-22 DIAGNOSIS — Z1211 Encounter for screening for malignant neoplasm of colon: Secondary | ICD-10-CM | POA: Diagnosis not present

## 2014-10-22 DIAGNOSIS — K5730 Diverticulosis of large intestine without perforation or abscess without bleeding: Secondary | ICD-10-CM

## 2014-10-22 DIAGNOSIS — I10 Essential (primary) hypertension: Secondary | ICD-10-CM | POA: Diagnosis not present

## 2014-10-22 HISTORY — DX: Benign neoplasm of descending colon: D12.4

## 2014-10-22 MED ORDER — SODIUM CHLORIDE 0.9 % IV SOLN: 500.0000 mL | INTRAVENOUS | Status: DC

## 2014-10-22 NOTE — Op Note (Signed)
[facility]  [address] [place], [address]   COLONOSCOPY PROCEDURE REPORT  PATIENT: Yolanda [name], Yolanda [name]  MR#: [id number] BIRTHDATE: 09-22-1935 , 86  yrs. old GENDER: female ENDOSCOPIST: Inda Castle, MD REFERRED FY:[id number] Laurance Flatten, M.D. PROCEDURE DATE:  10/22/2014 PROCEDURE:   Colonoscopy, diagnostic First Screening Colonoscopy - Avg.  risk and is 50 yrs.  old or older - No.  Prior Negative Screening - Now for repeat screening. 10 or more years since last screening  History of Adenoma - Now for follow-up colonoscopy & has been > or = to 3 yrs.  N/A  Polyps Removed Today? No.  Recommend repeat exam, <10 yrs? No. ASA CLASS:   Class II INDICATIONS:average risk for colon cancer. MEDICATIONS: Monitored anesthesia care and Propofol 200 mg IV  DESCRIPTION OF PROCEDURE:   After the risks benefits and alternatives of the procedure were thoroughly explained, informed consent was obtained.  The digital rectal exam revealed no abnormalities of the rectum.   The [id number]  endoscope was introduced through the anus and advanced to the cecum, which was identified by both the appendix and ileocecal valve. No adverse events experienced.   The quality of the prep was Suprep fair Suprep adequate  The instrument was then slowly withdrawn as the colon was fully examined.      COLON FINDINGS: There was moderate diverticulosis noted in the sigmoid colon.   The examination was otherwise normal.  Retroflexed views revealed no abnormalities. The time to cecum=6 minutes 06 seconds.  Withdrawal time=8 minutes 32 seconds.  The scope was withdrawn and the procedure completed. COMPLICATIONS: There were no immediate complications.  ENDOSCOPIC IMPRESSION: 1.   Moderate diverticulosis was noted in the sigmoid colon 2.   The examination was otherwise normal  RECOMMENDATIONS: Given your age, you will not need another colonoscopy for colon cancer screening or polyp surveillance.   These types of tests usually stop around the age 20.  eSigned:  Inda Castle, MD 10/22/2014 3:40 PM   cc:

## 2014-10-22 NOTE — Patient Instructions (Signed)

## 2014-10-26 ENCOUNTER — Telehealth: Payer: Self-pay | Admitting: *Deleted

## 2014-10-26 NOTE — Telephone Encounter (Signed)
  Follow up Call-  Call back number 10/22/2014  Post procedure Call Back phone  # [phone]  Permission to leave phone message Yes     Patient questions:  Do you have a fever, pain , or abdominal swelling? No. Pain Score  0 *  Have you tolerated food without any problems? Yes.    Have you been able to return to your normal activities? Yes.    Do you have any questions about your discharge instructions: Diet   No. Medications  No. Follow up visit  No.  Do you have questions or concerns about your Care? No.  Actions: * If pain score is 4 or above: No action needed, pain <4.

## 2014-11-20 ENCOUNTER — Other Ambulatory Visit: Payer: Self-pay | Admitting: Family Medicine

## 2014-11-20 MED ORDER — COLESEVELAM HCL 625 MG PO TABS: 1875.0000 mg | ORAL_TABLET | Freq: Two times a day (BID) | ORAL | Status: DC

## 2014-11-20 NOTE — Telephone Encounter (Signed)
rx sent to pharmacy and patient husband aware.

## 2014-12-17 ENCOUNTER — Telehealth: Payer: Self-pay | Admitting: Pharmacist

## 2014-12-17 NOTE — Telephone Encounter (Signed)
Patient contacted office about concerns of Prolia increasing lipids.  Over last 9 months LDL-P has increased.  Reviewed data from [facility] about this side effects.  It looks like increase was not very significant.  Discussed with Dr Laurance Flatten and decided to not adminsiter Prolia this time.  Patient to have lipids rechecked in next few months.  Will reassess restarting Prolia in August or September 2016.

## 2014-12-21 DIAGNOSIS — D1801 Hemangioma of skin and subcutaneous tissue: Secondary | ICD-10-CM | POA: Diagnosis not present

## 2014-12-21 DIAGNOSIS — D239 Other benign neoplasm of skin, unspecified: Secondary | ICD-10-CM | POA: Diagnosis not present

## 2014-12-21 DIAGNOSIS — L570 Actinic keratosis: Secondary | ICD-10-CM | POA: Diagnosis not present

## 2015-01-01 ENCOUNTER — Ambulatory Visit: Payer: Medicare Other | Admitting: Family Medicine

## 2015-01-04 ENCOUNTER — Encounter: Payer: Self-pay | Admitting: Family Medicine

## 2015-01-04 ENCOUNTER — Ambulatory Visit (INDEPENDENT_AMBULATORY_CARE_PROVIDER_SITE_OTHER): Payer: Medicare Other | Admitting: Family Medicine

## 2015-01-04 VITALS — BP 140/77 | HR 68 | Temp 97.1°F | Ht 62.0 in | Wt 119.0 lb

## 2015-01-04 DIAGNOSIS — I10 Essential (primary) hypertension: Secondary | ICD-10-CM

## 2015-01-04 DIAGNOSIS — E559 Vitamin D deficiency, unspecified: Secondary | ICD-10-CM | POA: Diagnosis not present

## 2015-01-04 DIAGNOSIS — M810 Age-related osteoporosis without current pathological fracture: Secondary | ICD-10-CM

## 2015-01-04 DIAGNOSIS — E785 Hyperlipidemia, unspecified: Secondary | ICD-10-CM

## 2015-01-04 DIAGNOSIS — K219 Gastro-esophageal reflux disease without esophagitis: Secondary | ICD-10-CM | POA: Diagnosis not present

## 2015-01-04 MED ORDER — OMEGA-3-ACID ETHYL ESTERS 1 G PO CAPS: 2.0000 g | ORAL_CAPSULE | Freq: Two times a day (BID) | ORAL | Status: DC

## 2015-01-04 NOTE — Progress Notes (Signed)
Subjective:    Patient ID: Yolanda [name], female    DOB: 1935/01/06, 80 y.o.   MRN: [id number]  HPI Pt here for follow up and management of chronic medical problems which includes hypertension, hyperlipidemia and GERD. She is taking medications regularly. The patient has no complaints and says she preferred not to be here today. She is requesting a refill on that. The patient is in good spirits today. She has not been taking her potassium and has not taken this for a good while even though she is taking a diuretic. She denies chest pain or shortness of breath and is having no GI symptoms. She is followed regularly for her DEXA scan was        Patient Active Problem List   Diagnosis Date Noted  . Benign neoplasm of descending colon 10/22/2014  . Osteoporosis, postmenopausal 10/16/2013  . Allergic rhinitis 08/20/2013  . Vitamin D deficiency 08/20/2013  . GERD (gastroesophageal reflux disease) 08/20/2013  . THYROID NODULE 01/11/2009  . HYPERLIPIDEMIA-MIXED 01/11/2009  . ANEMIA 01/11/2009  . HYPERTENSION, UNSPECIFIED 01/11/2009  . CAD, UNSPECIFIED SITE 01/11/2009  . CAROTID STENOSIS 01/11/2009   Outpatient Encounter Prescriptions as of 01/04/2015  Medication Sig  . acyclovir ointment (ZOVIRAX) 5 % Apply 1 application topically every 3 (three) hours. Apply frequently and sparingly to cold sores as needed.  Marland Kitchen aspirin EC 81 MG tablet Take 81 mg by mouth daily.  Marland Kitchen azelastine (ASTELIN) 0.1 % nasal spray Place 1 spray into both nostrils 2 (two) times daily. Use in each nostril as directed (Patient not taking: Reported on 10/22/2014)  . calcium carbonate 200 MG capsule Take 1 capsule (200 mg total) by mouth daily.  . Cholecalciferol (VITAMIN D3) 1000 UNITS CAPS Take by mouth 2 (two) times daily.    . CO ENZYME Q-10 PO Take by mouth.    . colesevelam (WELCHOL) 625 MG tablet Take 3 tablets (1,875 mg total) by mouth 2 (two) times daily with a meal. Take one tablet by mouth three times a day  .  denosumab (PROLIA) 60 MG/ML SOLN injection Inject 60 mg into the skin every 6 (six) months. Administer in upper arm, thigh, or abdomen  . fluticasone (FLONASE) 50 MCG/ACT nasal spray 1-2 sprays each nostril daily as directed for allergic rhinitis  . hydrochlorothiazide (MICROZIDE) 12.5 MG capsule Take 1 capsule (12.5 mg total) by mouth daily.  . IRON PO Take by mouth every other day.    . Multiple Vitamin (MULTI-VITAMIN PO) Take by mouth.    . omega-3 acid ethyl esters (LOVAZA) 1 G capsule Take 2 capsules (2 g total) by mouth 2 (two) times daily.  . pantoprazole (PROTONIX) 40 MG tablet TAKE 1 TABLET DAILY  . potassium chloride (KLOR-CON 10) 10 MEQ tablet Take 1 tablet (10 mEq total) by mouth daily.  . pravastatin (PRAVACHOL) 80 MG tablet TAKE 1 TABLET DAILY  . valACYclovir (VALTREX) 1000 MG tablet Take 1 tablet (1,000 mg total) by mouth daily. As directed    Review of Systems  Constitutional: Negative.   HENT: Negative.   Eyes: Negative.   Respiratory: Negative.   Cardiovascular: Negative.   Gastrointestinal: Negative.   Endocrine: Negative.   Genitourinary: Negative.   Musculoskeletal: Negative.   Skin: Negative.   Allergic/Immunologic: Negative.   Neurological: Negative.   Hematological: Negative.   Psychiatric/Behavioral: Negative.        Objective:   Physical Exam  Constitutional: She is oriented to person, place, and time. She appears well-developed and well-nourished.  HENT:  Head: Normocephalic and atraumatic.  Right Ear: External ear normal.  Left Ear: External ear normal.  Nose: Nose normal.  Mouth/Throat: Oropharynx is clear and moist.  Eyes: Conjunctivae and EOM are normal. Pupils are equal, round, and reactive to light. Right eye exhibits no discharge. Left eye exhibits no discharge. No scleral icterus.  Neck: Normal range of motion. Neck supple. No thyromegaly present.  Cardiovascular: Normal rate, regular rhythm, normal heart sounds and intact distal pulses.   Exam reveals no gallop and no friction rub.   No murmur heard. At 72/m  Pulmonary/Chest: Effort normal and breath sounds normal. No respiratory distress. She has no wheezes. She has no rales. She exhibits no tenderness.  Abdominal: Soft. Bowel sounds are normal. She exhibits no mass. There is no tenderness. There is no rebound and no guarding.  Musculoskeletal: Normal range of motion. She exhibits no edema or tenderness.  Lymphadenopathy:    She has no cervical adenopathy.  Neurological: She is alert and oriented to person, place, and time. She has normal reflexes. No cranial nerve deficit.  Skin: Skin is warm and dry. No rash noted.  Psychiatric: She has a normal mood and affect. Her behavior is normal. Judgment and thought content normal.  Nursing note and vitals reviewed.  BP 140/77 mmHg  Pulse 68  Temp(Src) 97.1 F (36.2 C) (Oral)  Ht '5\' 2"'  (1.575 m)  Wt 119 lb (53.978 kg)  BMI 21.76 kg/m2        Assessment & Plan:  1. Vitamin D deficiency -Continue current treatment pending results of lab work - Vit D  25 hydroxy (rtn osteoporosis monitoring)  2. Gastroesophageal reflux disease, esophagitis presence not specified -The patient is having no problems with her reflux at this point in time and she will continue with her proton - Hepatic function panel - CBC with Differential/Platelet  3. Essential hypertension -The blood pressure is good today and she will continue with current treatment - BMP8+EGFR - Hepatic function panel - CBC with Differential/Platelet  4. Hyperlipemia -She will continue with her cholesterol medicine pending results of lab work. - NMR, lipoprofile  5. Osteoporosis, postmenopausal -This will be followed up this coming summer.  Meds ordered this encounter  Medications  . omega-3 acid ethyl esters (LOVAZA) 1 G capsule    Sig: Take 2 capsules (2 g total) by mouth 2 (two) times daily.    Dispense:  360 capsule    Refill:  3   Patient Instructions                        Medicare Annual Wellness Visit  [facility] and the medical providers at [facility] strive to bring you the best medical care.  In doing so we not only want to address your current medical conditions and concerns but also to detect new conditions early and prevent illness, disease and health-related problems.    Medicare offers a yearly Wellness Visit which allows our clinical staff to assess your need for preventative services including immunizations, lifestyle education, counseling to decrease risk of preventable diseases and screening for fall risk and other medical concerns.    This visit is provided free of charge (no copay) for all Medicare recipients. The clinical pharmacists at [facility] have begun to conduct these Wellness Visits which will also include a thorough review of all your medications.    As you primary medical provider recommend that you make an appointment  for your Annual Wellness Visit if you have not done so already this year.  You may set up this appointment before you leave today or you may call back ([phone]) and schedule an appointment.  Please make sure when you call that you mention that you are scheduling your Annual Wellness Visit with the clinical pharmacist so that the appointment may be made for the proper length of time.     Continue current medications. Continue good therapeutic lifestyle changes which include good diet and exercise. Fall precautions discussed with patient. If an FOBT was given today- please return it to our front desk. If you are over 77 years old - you may need Prevnar 65 or the adult Pneumonia vaccine.  Flu Shots are still available at our office. If you still haven't had one please call to set up a nurse visit to get one.   After your visit with [place] today you will receive a survey in the mail or online from [employer] regarding your care with [place]. Please take a moment to  fill this out. Your feedback is very important to [place] as you can help [place] better understand your patient needs as well as improve your experience and satisfaction. WE CARE ABOUT YOU!!!   Stay active and be careful do not put yourself at risk for falling We will check the potassium in the lab work is being done today and will let you know about that as soon as we get the results back You had a recent colonoscopy and do not need to have an FOBT today.   Arrie Senate MD

## 2015-01-04 NOTE — Patient Instructions (Addendum)
Medicare Annual Wellness Visit  [facility] and the medical providers at [facility] strive to bring you the best medical care.  In doing so we not only want to address your current medical conditions and concerns but also to detect new conditions early and prevent illness, disease and health-related problems.    Medicare offers a yearly Wellness Visit which allows our clinical staff to assess your need for preventative services including immunizations, lifestyle education, counseling to decrease risk of preventable diseases and screening for fall risk and other medical concerns.    This visit is provided free of charge (no copay) for all Medicare recipients. The clinical pharmacists at [facility] have begun to conduct these Wellness Visits which will also include a thorough review of all your medications.    As you primary medical provider recommend that you make an appointment for your Annual Wellness Visit if you have not done so already this year.  You may set up this appointment before you leave today or you may call back ([phone]) and schedule an appointment.  Please make sure when you call that you mention that you are scheduling your Annual Wellness Visit with the clinical pharmacist so that the appointment may be made for the proper length of time.     Continue current medications. Continue good therapeutic lifestyle changes which include good diet and exercise. Fall precautions discussed with patient. If an FOBT was given today- please return it to our front desk. If you are over 89 years old - you may need Prevnar 40 or the adult Pneumonia vaccine.  Flu Shots are still available at our office. If you still haven't had one please call to set up a nurse visit to get one.   After your visit with [place] today you will receive a survey in the mail or online from [employer] regarding your care with [place]. Please take a moment to  fill this out. Your feedback is very important to [place] as you can help [place] better understand your patient needs as well as improve your experience and satisfaction. WE CARE ABOUT YOU!!!   Stay active and be careful do not put yourself at risk for falling We will check the potassium in the lab work is being done today and will let you know about that as soon as we get the results back You had a recent colonoscopy and do not need to have an FOBT today.

## 2015-01-05 LAB — BMP8+EGFR
BUN/Creatinine Ratio: 18 (ref 11–26)
BUN: 15 mg/dL (ref 8–27)
CHLORIDE: 101 mmol/L (ref 97–108)
CO2: 29 mmol/L (ref 18–29)
Calcium: 9.6 mg/dL (ref 8.7–10.3)
Creatinine, Ser: 0.84 mg/dL (ref 0.57–1.00)
GFR calc non Af Amer: 67 mL/min/{1.73_m2} (ref >59)
GFR, EST AFRICAN AMERICAN: 77 mL/min/{1.73_m2} (ref >59)
Glucose: 85 mg/dL (ref 65–99)
Potassium: 4.1 mmol/L (ref 3.5–5.2)
Sodium: 142 mmol/L (ref 134–144)

## 2015-01-05 LAB — NMR, LIPOPROFILE
Cholesterol: 186 mg/dL (ref 100–199)
HDL Cholesterol by NMR: 87 mg/dL (ref >39)
HDL Particle Number: 46.4 umol/L (ref >=30.5)
LDL PARTICLE NUMBER: 1006 nmol/L — AB (ref <1000)
LDL Size: 20.7 nm (ref >20.5)
LDL-C: 85 mg/dL (ref 0–99)
LP-IR Score: <25 (ref <=45)
Small LDL Particle Number: 465 nmol/L (ref <=527)
Triglycerides by NMR: 72 mg/dL (ref 0–149)

## 2015-01-05 LAB — HEPATIC FUNCTION PANEL
ALBUMIN: 4.3 g/dL (ref 3.5–4.8)
ALT: 12 IU/L (ref 0–32)
AST: 28 IU/L (ref 0–40)
Alkaline Phosphatase: 37 IU/L — ABNORMAL LOW (ref 39–117)
Bilirubin Total: 0.4 mg/dL (ref 0.0–1.2)
Bilirubin, Direct: 0.15 mg/dL (ref 0.00–0.40)
TOTAL PROTEIN: 6.4 g/dL (ref 6.0–8.5)

## 2015-01-05 LAB — CBC WITH DIFFERENTIAL/PLATELET
Basophils Absolute: 0 10*3/uL (ref 0.0–0.2)
Basos: 0 %
Eos: 4 %
Eosinophils Absolute: 0.3 10*3/uL (ref 0.0–0.4)
HCT: 42 % (ref 34.0–46.6)
HEMOGLOBIN: 13.9 g/dL (ref 11.1–15.9)
Immature Grans (Abs): 0 10*3/uL (ref 0.0–0.1)
Immature Granulocytes: 0 %
LYMPHS ABS: 2.8 10*3/uL (ref 0.7–3.1)
Lymphs: 39 %
MCH: 31.7 pg (ref 26.6–33.0)
MCHC: 33.1 g/dL (ref 31.5–35.7)
MCV: 96 fL (ref 79–97)
MONOCYTES: 11 %
MONOS ABS: 0.8 10*3/uL (ref 0.1–0.9)
NEUTROS ABS: 3.1 10*3/uL (ref 1.4–7.0)
Neutrophils Relative %: 46 %
Platelets: 181 10*3/uL (ref 150–379)
RBC: 4.39 x10E6/uL (ref 3.77–5.28)
RDW: 13 % (ref 12.3–15.4)
WBC: 7 10*3/uL (ref 3.4–10.8)

## 2015-01-05 LAB — VITAMIN D 25 HYDROXY (VIT D DEFICIENCY, FRACTURES): Vit D, 25-Hydroxy: 53.3 ng/mL (ref 30.0–100.0)

## 2015-03-10 ENCOUNTER — Ambulatory Visit (INDEPENDENT_AMBULATORY_CARE_PROVIDER_SITE_OTHER): Payer: Medicare Other | Admitting: Cardiology

## 2015-03-10 ENCOUNTER — Encounter: Payer: Self-pay | Admitting: Cardiology

## 2015-03-10 VITALS — BP 130/80 | HR 60 | Ht 62.0 in | Wt 119.0 lb

## 2015-03-10 DIAGNOSIS — I10 Essential (primary) hypertension: Secondary | ICD-10-CM | POA: Diagnosis not present

## 2015-03-10 DIAGNOSIS — I2510 Atherosclerotic heart disease of native coronary artery without angina pectoris: Secondary | ICD-10-CM | POA: Diagnosis not present

## 2015-03-10 NOTE — Patient Instructions (Signed)
Medication Instructions:  Your physician recommends that you continue on your current medications as directed. Please refer to the Current Medication list given to you today.  Follow-Up: Follow up in 1 year with Dr. Percival Spanish in [facility] will receive a letter in the mail 2 months before you are due.  Please call us when you receive this letter to schedule your follow up appointment.  Thank you for choosing [facility]!!

## 2015-03-10 NOTE — Progress Notes (Signed)
HPI The patient presents for follow up of CAD.  Since I last saw her she has done well.  The patient denies any new symptoms such as chest discomfort, neck or arm discomfort. There has been no new shortness of breath, PND or orthopnea. There have been no reported palpitations, presyncope or syncope.  She exercises by dancing 3 x per week.  Allergies  Allergen Reactions  . Lipitor [Atorvastatin Calcium]     Headaches    . Sulfonamide Derivatives   . K+ Care [Potassium Chloride] Diarrhea  . Sulfa Antibiotics Rash    Fever blisters    Current Outpatient Prescriptions  Medication Sig Dispense Refill  . acyclovir ointment (ZOVIRAX) 5 % Apply 1 application topically every 3 (three) hours. Apply frequently and sparingly to cold sores as needed. 30 g 4  . aspirin EC 81 MG tablet Take 81 mg by mouth daily.    Marland Kitchen azelastine (ASTELIN) 0.1 % nasal spray Place 1 spray into both nostrils 2 (two) times daily. Use in each nostril as directed 90 mL 11  . calcium carbonate 200 MG capsule Take 1 capsule (200 mg total) by mouth daily.    . Cholecalciferol (VITAMIN D3) 1000 UNITS CAPS Take by mouth 2 (two) times daily.      . CO ENZYME Q-10 PO Take by mouth.      . colesevelam (WELCHOL) 625 MG tablet Take 3 tablets (1,875 mg total) by mouth 2 (two) times daily with a meal. Take one tablet by mouth three times a day 540 tablet 1  . fluticasone (FLONASE) 50 MCG/ACT nasal spray 1-2 sprays each nostril daily as directed for allergic rhinitis 48 g 2  . hydrochlorothiazide (MICROZIDE) 12.5 MG capsule Take 1 capsule (12.5 mg total) by mouth daily. 90 capsule 3  . IRON PO Take by mouth every other day.      . Multiple Vitamin (MULTI-VITAMIN PO) Take by mouth.      . omega-3 acid ethyl esters (LOVAZA) 1 G capsule Take 2 capsules (2 g total) by mouth 2 (two) times daily. 360 capsule 3  . pantoprazole (PROTONIX) 40 MG tablet TAKE 1 TABLET DAILY 90 tablet 3  . pravastatin (PRAVACHOL) 80 MG tablet TAKE 1 TABLET DAILY  90 tablet 1  . valACYclovir (VALTREX) 1000 MG tablet Take 1 tablet (1,000 mg total) by mouth daily. As directed 90 tablet 0  . denosumab (PROLIA) 60 MG/ML SOLN injection Inject 60 mg into the skin every 6 (six) months. Administer in upper arm, thigh, or abdomen     No current facility-administered medications for this visit.    Past Medical History  Diagnosis Date  . Thyroid nodule 8/08  . Lung nodule 1/09  . Fuch's endothelial dystrophy 04/2009    Dr, Shanon Rosser   . Osteoporosis   . CAD (coronary artery disease)     LAD lession   . Osteopenia   . Hypertension   . Hyperlipidemia   . Esophagitis, acute   . GERD (gastroesophageal reflux disease) 11/00  . Gastritis 11/00  . Diverticulosis 11/00  . Hemorrhoids 8/05  . NSVD (normal spontaneous vaginal delivery) 1967    female   . Allergy     seasonal  . Anemia   . Arthritis   . Cancer     skin  . Cataract     removed bilaterally    Past Surgical History  Procedure Laterality Date  . Bilateral cataract surgery  06/2007    DR. Groat   .  Bilateral cataract surgery  10/08    Dr. Katy Fitch   . Facial cosmetic surgery  7/96    Dr. Mikle Bosworth    . Cardiac cath with angioplasty & stent replacement  4/98  . Cardiac cath 40-50%lad  6/07  . Ct lung screening  6/07  . Colonoscopy    . Meniscus repair      ROS:  Joint pain, back pain.  Otherwise as stated in the HPI and negative for all other systems.  PHYSICAL EXAM BP 130/80 mmHg  Pulse 60  Ht 5\' 2"  (1.575 m)  Wt 119 lb (53.978 kg)  BMI 21.76 kg/m2 GENERAL:  Well appearing NECK:  No jugular venous distention, waveform within normal limits, carotid upstroke brisk and symmetric, no bruits, no thyromegaly LUNGS:  Clear to auscultation bilaterally BACK:  No CVA tenderness HEART:  PMI not displaced or sustained,S1 and S2 within normal limits, no S3, no S4, no clicks, no rubs, no murmurs ABD:  Flat, positive bowel sounds normal in frequency in pitch, no bruits, no rebound, no guarding, no  midline pulsatile mass, no hepatomegaly, no splenomegaly EXT:  2 plus pulses throughout, no edema, no cyanosis no clubbing   EKG:  Sinus rhythm, rate 60, axis within normal limits, intervals within normal limits, non specific inferior ST T wave changes, no change. 03/10/2015   ASSESSMENT AND PLAN  CAD, UNSPECIFIED SITE -  She has had no new symptoms since her stress perfusion study in 2013.  No change in therapy is indicated.   CAROTID STENOSIS -  She had 0-39% right stenosis and 40-59% left stenosis.  OI will follow this up in 2017.    HYPERTENSION, UNSPECIFIED -  The blood pressure is at target. No change in medications is indicated. We will continue with therapeutic lifestyle changes (TLC).  HYPERLIPIDEMIA-MIXED -  This is followed closely by Redge Gainer, MD.

## 2015-04-09 ENCOUNTER — Other Ambulatory Visit: Payer: Medicare Other

## 2015-04-09 DIAGNOSIS — H547 Unspecified visual loss: Secondary | ICD-10-CM | POA: Diagnosis not present

## 2015-04-09 DIAGNOSIS — H47012 Ischemic optic neuropathy, left eye: Secondary | ICD-10-CM | POA: Diagnosis not present

## 2015-04-09 DIAGNOSIS — D3132 Benign neoplasm of left choroid: Secondary | ICD-10-CM | POA: Diagnosis not present

## 2015-04-09 DIAGNOSIS — I639 Cerebral infarction, unspecified: Secondary | ICD-10-CM | POA: Diagnosis not present

## 2015-04-09 DIAGNOSIS — H04123 Dry eye syndrome of bilateral lacrimal glands: Secondary | ICD-10-CM | POA: Diagnosis not present

## 2015-04-09 DIAGNOSIS — R51 Headache: Secondary | ICD-10-CM | POA: Diagnosis not present

## 2015-04-09 DIAGNOSIS — Z8673 Personal history of transient ischemic attack (TIA), and cerebral infarction without residual deficits: Secondary | ICD-10-CM | POA: Diagnosis not present

## 2015-04-09 DIAGNOSIS — H469 Unspecified optic neuritis: Secondary | ICD-10-CM

## 2015-04-09 DIAGNOSIS — H4612 Retrobulbar neuritis, left eye: Secondary | ICD-10-CM | POA: Diagnosis not present

## 2015-04-09 DIAGNOSIS — H1851 Endothelial corneal dystrophy: Secondary | ICD-10-CM | POA: Diagnosis not present

## 2015-04-09 DIAGNOSIS — Z961 Presence of intraocular lens: Secondary | ICD-10-CM | POA: Diagnosis not present

## 2015-04-09 LAB — CBC WITH DIFFERENTIAL/PLATELET
BASOS: 0 %
Basophils Absolute: 0 10*3/uL (ref 0.0–0.2)
EOS (ABSOLUTE): 0.3 10*3/uL (ref 0.0–0.4)
EOS: 4 %
HEMOGLOBIN: 14.4 g/dL (ref 11.1–15.9)
Hematocrit: 40.8 % (ref 34.0–46.6)
IMMATURE GRANS (ABS): 0 10*3/uL (ref 0.0–0.1)
Immature Granulocytes: 0 %
LYMPHS: 38 %
Lymphocytes Absolute: 2.7 10*3/uL (ref 0.7–3.1)
MCH: 32.8 pg (ref 26.6–33.0)
MCHC: 35.3 g/dL (ref 31.5–35.7)
MCV: 93 fL (ref 79–97)
MONOCYTES: 8 %
Monocytes Absolute: 0.6 10*3/uL (ref 0.1–0.9)
Neutrophils Absolute: 3.5 10*3/uL (ref 1.4–7.0)
Neutrophils: 50 %
Platelets: 173 10*3/uL (ref 150–379)
RBC: 4.39 x10E6/uL (ref 3.77–5.28)
RDW: 13.4 % (ref 12.3–15.4)
WBC: 7 10*3/uL (ref 3.4–10.8)

## 2015-04-09 LAB — C-REACTIVE PROTEIN: CRP: 0.6 mg/L (ref 0.0–4.9)

## 2015-04-09 LAB — SEDIMENTATION RATE: SED RATE: 4 mm/h (ref 0–40)

## 2015-04-09 NOTE — Progress Notes (Signed)
Lab only 

## 2015-04-12 ENCOUNTER — Telehealth: Payer: Self-pay | Admitting: Family Medicine

## 2015-04-19 DIAGNOSIS — D329 Benign neoplasm of meninges, unspecified: Secondary | ICD-10-CM | POA: Diagnosis not present

## 2015-04-19 DIAGNOSIS — H469 Unspecified optic neuritis: Secondary | ICD-10-CM | POA: Diagnosis not present

## 2015-04-20 ENCOUNTER — Other Ambulatory Visit: Payer: Self-pay | Admitting: Neurosurgery

## 2015-04-21 NOTE — Telephone Encounter (Signed)
Detailed message left for patient that is she still needs [place] to call us back if not please disregard this message. This encounter will be closed.

## 2015-05-04 ENCOUNTER — Other Ambulatory Visit: Payer: Self-pay | Admitting: *Deleted

## 2015-05-04 DIAGNOSIS — H04123 Dry eye syndrome of bilateral lacrimal glands: Secondary | ICD-10-CM | POA: Diagnosis not present

## 2015-05-04 DIAGNOSIS — D3132 Benign neoplasm of left choroid: Secondary | ICD-10-CM | POA: Diagnosis not present

## 2015-05-04 DIAGNOSIS — J301 Allergic rhinitis due to pollen: Secondary | ICD-10-CM

## 2015-05-04 DIAGNOSIS — H1851 Endothelial corneal dystrophy: Secondary | ICD-10-CM | POA: Diagnosis not present

## 2015-05-04 DIAGNOSIS — Z961 Presence of intraocular lens: Secondary | ICD-10-CM | POA: Diagnosis not present

## 2015-05-04 DIAGNOSIS — H469 Unspecified optic neuritis: Secondary | ICD-10-CM | POA: Diagnosis not present

## 2015-05-04 MED ORDER — FLUTICASONE PROPIONATE 50 MCG/ACT NA SUSP: NASAL | Status: DC

## 2015-05-05 NOTE — Pre-Procedure Instructions (Signed)
Yolanda [name]  05/05/2015      [id number] [place], [place] [address] [place] [address] Phone: [phone] Fax: [phone]  [facility] [place] [place] [address] Phone: [phone] Fax: [phone]    Your procedure is scheduled on Thursday, May 13, 2015  Report to [facility] Admitting at 5:30 A.M.  Call this number if you have problems the morning of surgery:  [phone]   Remember:  Do not eat food or drink liquids after midnight Wednesday, May 12, 2015  Take these medicines the morning of surgery with A SIP OF WATER:pantoprazole (PROTONIX), fluticasone (FLONASE)  nasal spray, if needed:valACYclovir (VALTREX) for cold sores, azelastine (ASTELIN)  nasal spray for allergies  Stop taking Aspirin, vitamins and herbal medications such as omega-3 acid ethyl esters (LOVAZA) and   CO ENZYME Q-10. Do not take any NSAIDs ie: Ibuprofen, Advil, Naproxen or any medication containing Aspirin; stop now.  Do not wear jewelry, make-up or nail polish.  Do not wear lotions, powders, or perfumes.  You may not wear deodorant.  Do not shave 48 hours prior to surgery.    Do not bring valuables to the hospital.  [facility] is not responsible for any belongings or valuables.  Contacts, dentures or bridgework may not be worn into surgery.  Leave your suitcase in the car.  After surgery it may be brought to your room.  For patients admitted to the hospital, discharge time will be determined by your treatment team.  Patients discharged the day of surgery will not be allowed to drive home.     Special instructions: Special Instructions:Special Instructions: [facility] - Preparing for Surgery  Before surgery, you can play an important role.  Because skin is not sterile, your skin needs to be as free of germs as possible.   You can reduce the number of germs on you skin by washing with CHG (chlorahexidine gluconate) soap before surgery.  CHG is an antiseptic cleaner which kills germs and bonds with the skin to continue killing germs even after washing.  Please DO NOT use if you have an allergy to CHG or antibacterial soaps.  If your skin becomes reddened/irritated stop using the CHG and inform your nurse when you arrive at Short Stay.  Do not shave (including legs and underarms) for at least 48 hours prior to the first CHG shower.  You may shave your face.  Please follow these instructions carefully:   1.  Shower with CHG Soap the night before surgery and the morning of Surgery.  2.  If you choose to wash your hair, wash your hair first as usual with your normal shampoo.  3.  After you shampoo, rinse your hair and body thoroughly to remove the Shampoo.  4.  Use CHG as you would any other liquid soap.  You can apply chg directly  to the skin and wash gently with scrungie or a clean washcloth.  5.  Apply the CHG Soap to your body ONLY FROM THE NECK DOWN.  Do not use on open wounds or open sores.  Avoid contact with your eyes, ears, mouth and genitals (private parts).  Wash genitals (private parts) with your normal soap.  6.  Wash thoroughly, paying special attention to the area where your surgery will be performed.  7.  Thoroughly rinse your body  with warm water from the neck down.  8.  DO NOT shower/wash with your normal soap after using and rinsing off the CHG Soap.  9.  Pat yourself dry with a clean towel.            10.  Wear clean pajamas.            11.  Place clean sheets on your bed the night of your first shower and do not sleep with pets.  Day of Surgery  Do not apply any lotions/deodorants the morning of surgery.  Please wear clean clothes to the hospital/surgery center.  Please read over the following fact sheets that you were given. Pain Booklet, Coughing and Deep Breathing and Surgical Site  Infection Prevention

## 2015-05-06 ENCOUNTER — Encounter (HOSPITAL_COMMUNITY): Payer: Self-pay

## 2015-05-06 ENCOUNTER — Encounter (HOSPITAL_COMMUNITY)
Admission: RE | Admit: 2015-05-06 | Discharge: 2015-05-06 | Disposition: A | Discharge status: Home / Self Care | Payer: Medicare Other | Source: Ambulatory Visit | Attending: Neurosurgery | Admitting: Neurosurgery

## 2015-05-06 DIAGNOSIS — Z0183 Encounter for blood typing: Secondary | ICD-10-CM | POA: Insufficient documentation

## 2015-05-06 DIAGNOSIS — E785 Hyperlipidemia, unspecified: Secondary | ICD-10-CM | POA: Insufficient documentation

## 2015-05-06 DIAGNOSIS — Z79899 Other long term (current) drug therapy: Secondary | ICD-10-CM | POA: Diagnosis not present

## 2015-05-06 DIAGNOSIS — Z7982 Long term (current) use of aspirin: Secondary | ICD-10-CM | POA: Diagnosis not present

## 2015-05-06 DIAGNOSIS — Z01812 Encounter for preprocedural laboratory examination: Secondary | ICD-10-CM | POA: Diagnosis not present

## 2015-05-06 DIAGNOSIS — I2510 Atherosclerotic heart disease of native coronary artery without angina pectoris: Secondary | ICD-10-CM | POA: Diagnosis not present

## 2015-05-06 DIAGNOSIS — Z01818 Encounter for other preprocedural examination: Secondary | ICD-10-CM | POA: Insufficient documentation

## 2015-05-06 DIAGNOSIS — Z87891 Personal history of nicotine dependence: Secondary | ICD-10-CM | POA: Diagnosis not present

## 2015-05-06 DIAGNOSIS — K219 Gastro-esophageal reflux disease without esophagitis: Secondary | ICD-10-CM | POA: Insufficient documentation

## 2015-05-06 DIAGNOSIS — I10 Essential (primary) hypertension: Secondary | ICD-10-CM | POA: Diagnosis not present

## 2015-05-06 DIAGNOSIS — D329 Benign neoplasm of meninges, unspecified: Secondary | ICD-10-CM | POA: Diagnosis not present

## 2015-05-06 HISTORY — DX: Headache: R51

## 2015-05-06 LAB — BASIC METABOLIC PANEL
Anion gap: 4 — ABNORMAL LOW (ref 5–15)
BUN: 11 mg/dL (ref 6–20)
CALCIUM: 9.5 mg/dL (ref 8.9–10.3)
CO2: 31 mmol/L (ref 22–32)
CREATININE: 0.89 mg/dL (ref 0.44–1.00)
Chloride: 101 mmol/L (ref 101–111)
GFR calc Af Amer: >60 mL/min (ref >60)
GFR calc non Af Amer: >60 mL/min (ref >60)
Glucose, Bld: 83 mg/dL (ref 65–99)
Potassium: 4.4 mmol/L (ref 3.5–5.1)
Sodium: 136 mmol/L (ref 135–145)

## 2015-05-06 LAB — CBC
HCT: 45.6 % (ref 36.0–46.0)
HEMOGLOBIN: 15.7 g/dL — AB (ref 12.0–15.0)
MCH: 32.8 pg (ref 26.0–34.0)
MCHC: 34.4 g/dL (ref 30.0–36.0)
MCV: 95.2 fL (ref 78.0–100.0)
Platelets: 129 10*3/uL — ABNORMAL LOW (ref 150–400)
RBC: 4.79 MIL/uL (ref 3.87–5.11)
RDW: 14 % (ref 11.5–15.5)
WBC: 8.6 10*3/uL (ref 4.0–10.5)

## 2015-05-06 LAB — ABO/RH: ABO/RH(D): O POS

## 2015-05-06 LAB — TYPE AND SCREEN
ABO/RH(D): O POS
ANTIBODY SCREEN: NEGATIVE

## 2015-05-06 NOTE — Progress Notes (Addendum)
PCP is Dr. Redge Gainer Cardiologist is Dr. Percival Spanish Stress test noted from 2013 in epic Denies having an echo Card cath noted from 1998 and 2007

## 2015-05-07 NOTE — Progress Notes (Signed)
Anesthesia Chart Review:   Pt is 80 year old female scheduled for L frontotemporal craniotomy for meningioma resection on 05/13/2015 with Dr. Kathyrn Sheriff.   Cardiologist is Dr. Percival Spanish, last office visit 03/10/15. PCP is Dr. Redge Gainer.   PMH includes: CAD (LAD stent 1998), HTN, hyperlipidemia, anemia, GERD, thyroid nodule, lung nodule. Former smoker. BMI 21.   Medications include: ASA, denosumab, hctz, iron, pravastatin, protonix.   Preoperative labs reviewed.    EKG 03/10/2015: NSR.   Carotid duplex US 03/04/2014: -mixed plaque with shadowing, bilaterally.  -R ICA with 1-39% stenosis -L ICA with 40-59% stenosis (low end of range)  Nuclear stress test 06/11/2012: Normal stress nuclear study. LV Ejection Fraction: 83%. LV Wall Motion: NL LV Function; NL Wall Motion.  Cardiac cath 03/07/2006: -Nonobstructive CAD with 50% and 40% stenoses in the proximal LAD, less than 10% stenosis at the stent site in the proximal LAD, no significant obstruction in the CX and RCA, normal LV function.   If no changes, I anticipate pt can proceed with surgery as scheduled.   Willeen Cass, FNP-BC [facility] Short Stay Surgical Center/Anesthesiology Phone: [phone] 05/07/2015 2:01 PM

## 2015-05-12 MED ORDER — CEFAZOLIN SODIUM-DEXTROSE 2-3 GM-% IV SOLR
2.0000 g | INTRAVENOUS | Status: AC
Start: 2015-05-13 — End: 2015-05-13
  Administered 2015-05-13: 2 g via INTRAVENOUS
  Filled 2015-05-12: qty 50

## 2015-05-13 ENCOUNTER — Encounter (HOSPITAL_COMMUNITY): Payer: Self-pay | Admitting: *Deleted

## 2015-05-13 ENCOUNTER — Inpatient Hospital Stay (HOSPITAL_COMMUNITY): Payer: Medicare Other

## 2015-05-13 ENCOUNTER — Inpatient Hospital Stay (HOSPITAL_COMMUNITY)
Admission: RE | Admit: 2015-05-13 | Discharge: 2015-05-17 | DRG: 027 | Disposition: A | Discharge status: Home Health | Payer: Medicare Other | Source: Ambulatory Visit | Attending: Neurosurgery | Admitting: Neurosurgery

## 2015-05-13 ENCOUNTER — Encounter (HOSPITAL_COMMUNITY)
Admission: RE | Disposition: A | Discharge status: Home Health | Payer: Self-pay | Source: Ambulatory Visit | Attending: Neurosurgery

## 2015-05-13 ENCOUNTER — Inpatient Hospital Stay (HOSPITAL_COMMUNITY): Payer: Medicare Other | Admitting: Certified Registered"

## 2015-05-13 ENCOUNTER — Inpatient Hospital Stay (HOSPITAL_COMMUNITY): Payer: Medicare Other | Admitting: Emergency Medicine

## 2015-05-13 DIAGNOSIS — K219 Gastro-esophageal reflux disease without esophagitis: Secondary | ICD-10-CM | POA: Diagnosis present

## 2015-05-13 DIAGNOSIS — Z955 Presence of coronary angioplasty implant and graft: Secondary | ICD-10-CM

## 2015-05-13 DIAGNOSIS — I10 Essential (primary) hypertension: Secondary | ICD-10-CM | POA: Diagnosis present

## 2015-05-13 DIAGNOSIS — E785 Hyperlipidemia, unspecified: Secondary | ICD-10-CM | POA: Diagnosis not present

## 2015-05-13 DIAGNOSIS — Z452 Encounter for adjustment and management of vascular access device: Secondary | ICD-10-CM | POA: Diagnosis not present

## 2015-05-13 DIAGNOSIS — Z79899 Other long term (current) drug therapy: Secondary | ICD-10-CM

## 2015-05-13 DIAGNOSIS — D329 Benign neoplasm of meninges, unspecified: Secondary | ICD-10-CM | POA: Diagnosis not present

## 2015-05-13 DIAGNOSIS — Z7982 Long term (current) use of aspirin: Secondary | ICD-10-CM

## 2015-05-13 DIAGNOSIS — Z09 Encounter for follow-up examination after completed treatment for conditions other than malignant neoplasm: Secondary | ICD-10-CM

## 2015-05-13 DIAGNOSIS — Z7951 Long term (current) use of inhaled steroids: Secondary | ICD-10-CM

## 2015-05-13 DIAGNOSIS — D320 Benign neoplasm of cerebral meninges: Principal | ICD-10-CM | POA: Diagnosis present

## 2015-05-13 DIAGNOSIS — I2510 Atherosclerotic heart disease of native coronary artery without angina pectoris: Secondary | ICD-10-CM | POA: Diagnosis not present

## 2015-05-13 DIAGNOSIS — D259 Leiomyoma of uterus, unspecified: Secondary | ICD-10-CM | POA: Diagnosis not present

## 2015-05-13 DIAGNOSIS — M810 Age-related osteoporosis without current pathological fracture: Secondary | ICD-10-CM | POA: Diagnosis present

## 2015-05-13 DIAGNOSIS — Z87891 Personal history of nicotine dependence: Secondary | ICD-10-CM | POA: Diagnosis not present

## 2015-05-13 DIAGNOSIS — H5462 Unqualified visual loss, left eye, normal vision right eye: Secondary | ICD-10-CM | POA: Diagnosis not present

## 2015-05-13 DIAGNOSIS — I609 Nontraumatic subarachnoid hemorrhage, unspecified: Secondary | ICD-10-CM | POA: Diagnosis not present

## 2015-05-13 DIAGNOSIS — D649 Anemia, unspecified: Secondary | ICD-10-CM | POA: Diagnosis not present

## 2015-05-13 HISTORY — PX: CRANIOTOMY: SHX93

## 2015-05-13 LAB — CBC
HCT: 37.2 % (ref 36.0–46.0)
HEMOGLOBIN: 12.8 g/dL (ref 12.0–15.0)
MCH: 32.7 pg (ref 26.0–34.0)
MCHC: 34.4 g/dL (ref 30.0–36.0)
MCV: 94.9 fL (ref 78.0–100.0)
Platelets: 121 10*3/uL — ABNORMAL LOW (ref 150–400)
RBC: 3.92 MIL/uL (ref 3.87–5.11)
RDW: 14.4 % (ref 11.5–15.5)
WBC: 6.8 10*3/uL (ref 4.0–10.5)

## 2015-05-13 LAB — CREATININE, SERUM
CREATININE: 0.81 mg/dL (ref 0.44–1.00)
GFR calc non Af Amer: >60 mL/min (ref >60)

## 2015-05-13 SURGERY — CRANIOTOMY TUMOR EXCISION
Anesthesia: General | Laterality: Left

## 2015-05-13 MED ORDER — FENTANYL CITRATE (PF) 250 MCG/5ML IJ SOLN
INTRAMUSCULAR | Status: AC
  Filled 2015-05-13: qty 5

## 2015-05-13 MED ORDER — EPHEDRINE SULFATE 50 MG/ML IJ SOLN
INTRAMUSCULAR | Status: DC | PRN
  Administered 2015-05-13 (×2): 10 mg via INTRAVENOUS

## 2015-05-13 MED ORDER — GLYCOPYRROLATE 0.2 MG/ML IJ SOLN
INTRAMUSCULAR | Status: DC | PRN
  Administered 2015-05-13: 0.6 mg via INTRAVENOUS

## 2015-05-13 MED ORDER — DOCUSATE SODIUM 100 MG PO CAPS
100.0000 mg | ORAL_CAPSULE | Freq: Two times a day (BID) | ORAL | Status: DC
  Administered 2015-05-14 – 2015-05-17 (×7): 100 mg via ORAL
  Filled 2015-05-13 (×9): qty 1

## 2015-05-13 MED ORDER — FENTANYL CITRATE (PF) 100 MCG/2ML IJ SOLN
INTRAMUSCULAR | Status: AC
  Filled 2015-05-13: qty 2

## 2015-05-13 MED ORDER — THROMBIN 20000 UNITS EX SOLR
CUTANEOUS | Status: DC | PRN
  Administered 2015-05-13: 07:00:00 via TOPICAL

## 2015-05-13 MED ORDER — PROPOFOL 10 MG/ML IV BOLUS
INTRAVENOUS | Status: DC | PRN
  Administered 2015-05-13: 100 mg via INTRAVENOUS

## 2015-05-13 MED ORDER — DEXAMETHASONE 4 MG PO TABS
4.0000 mg | ORAL_TABLET | Freq: Three times a day (TID) | ORAL | Status: DC
  Administered 2015-05-15 – 2015-05-17 (×6): 4 mg via ORAL
  Filled 2015-05-13 (×8): qty 1

## 2015-05-13 MED ORDER — NEOSTIGMINE METHYLSULFATE 10 MG/10ML IV SOLN
INTRAVENOUS | Status: AC
  Filled 2015-05-13: qty 3

## 2015-05-13 MED ORDER — BUPIVACAINE HCL (PF) 0.5 % IJ SOLN
INTRAMUSCULAR | Status: DC | PRN
  Administered 2015-05-13: 8 mL

## 2015-05-13 MED ORDER — SUCCINYLCHOLINE CHLORIDE 20 MG/ML IJ SOLN
INTRAMUSCULAR | Status: AC
  Filled 2015-05-13: qty 2

## 2015-05-13 MED ORDER — LIDOCAINE HCL (CARDIAC) 20 MG/ML IV SOLN
INTRAVENOUS | Status: AC
  Filled 2015-05-13: qty 5

## 2015-05-13 MED ORDER — PROMETHAZINE HCL 25 MG PO TABS: 12.5000 mg | ORAL_TABLET | ORAL | Status: DC | PRN

## 2015-05-13 MED ORDER — ONDANSETRON HCL 4 MG PO TABS: 4.0000 mg | ORAL_TABLET | ORAL | Status: DC | PRN

## 2015-05-13 MED ORDER — ONDANSETRON HCL 4 MG/2ML IJ SOLN
INTRAMUSCULAR | Status: AC
  Filled 2015-05-13: qty 2

## 2015-05-13 MED ORDER — DEXAMETHASONE SODIUM PHOSPHATE 10 MG/ML IJ SOLN
INTRAMUSCULAR | Status: DC | PRN
  Administered 2015-05-13: 10 mg via INTRAVENOUS

## 2015-05-13 MED ORDER — SODIUM CHLORIDE 0.9 % IR SOLN
Status: DC | PRN
  Administered 2015-05-13: 08:00:00

## 2015-05-13 MED ORDER — SODIUM CHLORIDE 0.9 % IV SOLN
INTRAVENOUS | Status: DC | PRN
  Administered 2015-05-13: 08:00:00 via INTRAVENOUS

## 2015-05-13 MED ORDER — DENOSUMAB 60 MG/ML ~~LOC~~ SOLN: 60.0000 mg | SUBCUTANEOUS | Status: DC

## 2015-05-13 MED ORDER — DEXAMETHASONE SODIUM PHOSPHATE 10 MG/ML IJ SOLN
INTRAMUSCULAR | Status: AC
  Filled 2015-05-13: qty 2

## 2015-05-13 MED ORDER — PROPOFOL 10 MG/ML IV BOLUS
INTRAVENOUS | Status: AC
  Filled 2015-05-13: qty 20

## 2015-05-13 MED ORDER — SENNA 8.6 MG PO TABS
1.0000 | ORAL_TABLET | Freq: Two times a day (BID) | ORAL | Status: DC
  Administered 2015-05-14 – 2015-05-17 (×7): 8.6 mg via ORAL
  Filled 2015-05-13 (×10): qty 1

## 2015-05-13 MED ORDER — ESMOLOL HCL 10 MG/ML IV SOLN
INTRAVENOUS | Status: DC | PRN
  Administered 2015-05-13 (×5): 30 mg via INTRAVENOUS
  Administered 2015-05-13: 40 mg via INTRAVENOUS
  Administered 2015-05-13: 10 mg via INTRAVENOUS

## 2015-05-13 MED ORDER — LIDOCAINE HCL (CARDIAC) 20 MG/ML IV SOLN
INTRAVENOUS | Status: AC
  Filled 2015-05-13: qty 10

## 2015-05-13 MED ORDER — CALCIUM CARBONATE 1250 (500 CA) MG PO TABS
1.0000 | ORAL_TABLET | Freq: Every day | ORAL | Status: DC
  Administered 2015-05-14 – 2015-05-17 (×4): 500 mg via ORAL
  Filled 2015-05-13 (×4): qty 1

## 2015-05-13 MED ORDER — 0.9 % SODIUM CHLORIDE (POUR BTL) OPTIME
TOPICAL | Status: DC | PRN
  Administered 2015-05-13 (×3): 1000 mL

## 2015-05-13 MED ORDER — PHENYLEPHRINE 40 MCG/ML (10ML) SYRINGE FOR IV PUSH (FOR BLOOD PRESSURE SUPPORT)
PREFILLED_SYRINGE | INTRAVENOUS | Status: AC
  Filled 2015-05-13: qty 10

## 2015-05-13 MED ORDER — MANNITOL 25 % IV SOLN
INTRAVENOUS | Status: DC | PRN
  Administered 2015-05-13: 25 g via INTRAVENOUS

## 2015-05-13 MED ORDER — LEVETIRACETAM 500 MG/5ML IV SOLN
500.0000 mg | Freq: Two times a day (BID) | INTRAVENOUS | Status: DC
  Administered 2015-05-13 – 2015-05-17 (×8): 500 mg via INTRAVENOUS
  Filled 2015-05-13 (×10): qty 5

## 2015-05-13 MED ORDER — PANTOPRAZOLE SODIUM 40 MG PO TBEC
40.0000 mg | DELAYED_RELEASE_TABLET | Freq: Every day | ORAL | Status: DC
  Administered 2015-05-13 – 2015-05-17 (×5): 40 mg via ORAL
  Filled 2015-05-13 (×5): qty 1

## 2015-05-13 MED ORDER — NEOSTIGMINE METHYLSULFATE 10 MG/10ML IV SOLN
INTRAVENOUS | Status: DC | PRN
  Administered 2015-05-13: 4 mg via INTRAVENOUS

## 2015-05-13 MED ORDER — ROCURONIUM BROMIDE 50 MG/5ML IV SOLN
INTRAVENOUS | Status: AC
  Filled 2015-05-13: qty 1

## 2015-05-13 MED ORDER — ONDANSETRON HCL 4 MG/2ML IJ SOLN: 4.0000 mg | Freq: Once | INTRAMUSCULAR | Status: DC | PRN

## 2015-05-13 MED ORDER — HYDROCHLOROTHIAZIDE 12.5 MG PO CAPS
12.5000 mg | ORAL_CAPSULE | Freq: Every day | ORAL | Status: DC
  Administered 2015-05-14 – 2015-05-17 (×4): 12.5 mg via ORAL
  Filled 2015-05-13 (×4): qty 1

## 2015-05-13 MED ORDER — FENTANYL CITRATE (PF) 100 MCG/2ML IJ SOLN
25.0000 ug | INTRAMUSCULAR | Status: DC | PRN
  Administered 2015-05-13 (×2): 25 ug via INTRAVENOUS

## 2015-05-13 MED ORDER — ASPIRIN EC 81 MG PO TBEC
81.0000 mg | DELAYED_RELEASE_TABLET | Freq: Every day | ORAL | Status: DC
  Administered 2015-05-14 – 2015-05-15 (×2): 81 mg via ORAL
  Filled 2015-05-13 (×2): qty 1

## 2015-05-13 MED ORDER — ROCURONIUM BROMIDE 50 MG/5ML IV SOLN
INTRAVENOUS | Status: AC
  Filled 2015-05-13: qty 2

## 2015-05-13 MED ORDER — PHENYLEPHRINE HCL 10 MG/ML IJ SOLN
10.0000 mg | INTRAVENOUS | Status: DC | PRN
  Administered 2015-05-13: 5 ug/min via INTRAVENOUS

## 2015-05-13 MED ORDER — THROMBIN 5000 UNITS EX SOLR
OROMUCOSAL | Status: DC | PRN
  Administered 2015-05-13 (×2): via TOPICAL

## 2015-05-13 MED ORDER — DEXAMETHASONE 6 MG PO TABS
6.0000 mg | ORAL_TABLET | Freq: Four times a day (QID) | ORAL | Status: AC
  Administered 2015-05-13 – 2015-05-14 (×4): 6 mg via ORAL
  Filled 2015-05-13 (×4): qty 1

## 2015-05-13 MED ORDER — SODIUM CHLORIDE 0.9 % IV SOLN
INTRAVENOUS | Status: DC | PRN
  Administered 2015-05-13 (×2): via INTRAVENOUS

## 2015-05-13 MED ORDER — ONDANSETRON HCL 4 MG/2ML IJ SOLN
4.0000 mg | INTRAMUSCULAR | Status: DC | PRN
  Administered 2015-05-13 (×2): 4 mg via INTRAVENOUS
  Filled 2015-05-13 (×2): qty 2

## 2015-05-13 MED ORDER — LEVETIRACETAM 500 MG/5ML IV SOLN
1000.0000 mg | INTRAVENOUS | Status: AC
  Administered 2015-05-13: 1000 mg via INTRAVENOUS
  Filled 2015-05-13: qty 10

## 2015-05-13 MED ORDER — OMEGA-3-ACID ETHYL ESTERS 1 G PO CAPS
2.0000 g | ORAL_CAPSULE | Freq: Two times a day (BID) | ORAL | Status: DC
  Administered 2015-05-14 – 2015-05-17 (×6): 2 g via ORAL
  Filled 2015-05-13 (×10): qty 2

## 2015-05-13 MED ORDER — LABETALOL HCL 5 MG/ML IV SOLN
INTRAVENOUS | Status: DC | PRN
  Administered 2015-05-13 (×2): 10 mg via INTRAVENOUS

## 2015-05-13 MED ORDER — SUCCINYLCHOLINE CHLORIDE 20 MG/ML IJ SOLN
INTRAMUSCULAR | Status: AC
  Filled 2015-05-13: qty 1

## 2015-05-13 MED ORDER — BACITRACIN ZINC 500 UNIT/GM EX OINT
TOPICAL_OINTMENT | CUTANEOUS | Status: DC | PRN
  Administered 2015-05-13: 1 via TOPICAL

## 2015-05-13 MED ORDER — ROCURONIUM BROMIDE 100 MG/10ML IV SOLN
INTRAVENOUS | Status: DC | PRN
  Administered 2015-05-13: 20 mg via INTRAVENOUS
  Administered 2015-05-13: 50 mg via INTRAVENOUS

## 2015-05-13 MED ORDER — SODIUM CHLORIDE 0.9 % IR SOLN
Status: DC | PRN
  Administered 2015-05-13: 1000 mL

## 2015-05-13 MED ORDER — PRAVASTATIN SODIUM 40 MG PO TABS
80.0000 mg | ORAL_TABLET | Freq: Every day | ORAL | Status: DC
  Administered 2015-05-14 – 2015-05-16 (×3): 80 mg via ORAL
  Filled 2015-05-13: qty 2
  Filled 2015-05-13: qty 1
  Filled 2015-05-13: qty 2
  Filled 2015-05-13 (×2): qty 1

## 2015-05-13 MED ORDER — AZELASTINE HCL 0.1 % NA SOLN: 1.0000 | Freq: Two times a day (BID) | NASAL | Status: DC | PRN

## 2015-05-13 MED ORDER — FLUTICASONE PROPIONATE 50 MCG/ACT NA SUSP
1.0000 | Freq: Every day | NASAL | Status: DC
  Filled 2015-05-13: qty 16

## 2015-05-13 MED ORDER — LIDOCAINE-EPINEPHRINE 1 %-1:100000 IJ SOLN
INTRAMUSCULAR | Status: DC | PRN
  Administered 2015-05-13: 8 mL

## 2015-05-13 MED ORDER — FENTANYL CITRATE (PF) 250 MCG/5ML IJ SOLN
INTRAMUSCULAR | Status: DC | PRN
  Administered 2015-05-13 (×3): 50 ug via INTRAVENOUS
  Administered 2015-05-13 (×2): 100 ug via INTRAVENOUS

## 2015-05-13 MED ORDER — BISACODYL 10 MG RE SUPP: 10.0000 mg | Freq: Every day | RECTAL | Status: DC | PRN

## 2015-05-13 MED ORDER — SODIUM CHLORIDE 0.9 % IV SOLN
INTRAVENOUS | Status: DC
  Administered 2015-05-15: 1000 mL via INTRAVENOUS

## 2015-05-13 MED ORDER — MORPHINE SULFATE 2 MG/ML IJ SOLN
1.0000 mg | INTRAMUSCULAR | Status: DC | PRN
  Administered 2015-05-13 – 2015-05-14 (×5): 2 mg via INTRAVENOUS
  Filled 2015-05-13 (×5): qty 1

## 2015-05-13 MED ORDER — GLYCOPYRROLATE 0.2 MG/ML IJ SOLN
INTRAMUSCULAR | Status: AC
  Filled 2015-05-13: qty 3

## 2015-05-13 MED ORDER — COLESEVELAM HCL 625 MG PO TABS
1875.0000 mg | ORAL_TABLET | Freq: Two times a day (BID) | ORAL | Status: DC
  Administered 2015-05-14 – 2015-05-17 (×5): 1875 mg via ORAL
  Filled 2015-05-13 (×10): qty 3

## 2015-05-13 MED ORDER — LABETALOL HCL 5 MG/ML IV SOLN
10.0000 mg | INTRAVENOUS | Status: DC | PRN
  Administered 2015-05-13: 20 mg via INTRAVENOUS
  Filled 2015-05-13: qty 4

## 2015-05-13 MED ORDER — ONDANSETRON HCL 4 MG/2ML IJ SOLN
INTRAMUSCULAR | Status: DC | PRN
  Administered 2015-05-13: 4 mg via INTRAVENOUS

## 2015-05-13 MED ORDER — EPHEDRINE SULFATE 50 MG/ML IJ SOLN
INTRAMUSCULAR | Status: AC
  Filled 2015-05-13: qty 1

## 2015-05-13 MED ORDER — HEPARIN SODIUM (PORCINE) 5000 UNIT/ML IJ SOLN
5000.0000 [IU] | Freq: Three times a day (TID) | INTRAMUSCULAR | Status: DC
  Administered 2015-05-14 – 2015-05-17 (×10): 5000 [IU] via SUBCUTANEOUS
  Filled 2015-05-13 (×12): qty 1

## 2015-05-13 MED ORDER — DEXAMETHASONE 4 MG PO TABS
4.0000 mg | ORAL_TABLET | Freq: Four times a day (QID) | ORAL | Status: AC
  Administered 2015-05-14 – 2015-05-15 (×4): 4 mg via ORAL
  Filled 2015-05-13 (×6): qty 1

## 2015-05-13 MED ORDER — ESMOLOL HCL 10 MG/ML IV SOLN
INTRAVENOUS | Status: AC
  Filled 2015-05-13: qty 20

## 2015-05-13 MED ORDER — LIDOCAINE HCL (CARDIAC) 20 MG/ML IV SOLN
INTRAVENOUS | Status: DC | PRN
  Administered 2015-05-13: 80 mg via INTRAVENOUS

## 2015-05-13 MED ORDER — HYDROCODONE-ACETAMINOPHEN 5-325 MG PO TABS
1.0000 | ORAL_TABLET | ORAL | Status: DC | PRN
  Administered 2015-05-14 (×2): 1 via ORAL
  Filled 2015-05-13 (×2): qty 1

## 2015-05-13 MED ORDER — SODIUM CHLORIDE 0.9 % IJ SOLN
INTRAMUSCULAR | Status: AC
  Filled 2015-05-13: qty 10

## 2015-05-13 MED ORDER — CALCIUM CARBONATE 200 MG PO CAPS: 250.0000 mg | ORAL_CAPSULE | Freq: Every day | ORAL | Status: DC

## 2015-05-13 MED ORDER — HYDRALAZINE HCL 20 MG/ML IJ SOLN
10.0000 mg | INTRAMUSCULAR | Status: DC | PRN
  Administered 2015-05-13: 10 mg via INTRAVENOUS
  Filled 2015-05-13: qty 1

## 2015-05-13 MED ORDER — CEFAZOLIN SODIUM 1-5 GM-% IV SOLN
1.0000 g | Freq: Three times a day (TID) | INTRAVENOUS | Status: AC
  Administered 2015-05-13 (×2): 1 g via INTRAVENOUS
  Filled 2015-05-13 (×2): qty 50

## 2015-05-13 SURGICAL SUPPLY — 113 items
APL SKNCLS STERI-STRIP NONHPOA (GAUZE/BANDAGES/DRESSINGS)
BANDAGE GAUZE 4  KLING STR (GAUZE/BANDAGES/DRESSINGS) ×2 IMPLANT
BATTERY IQ STERILE (MISCELLANEOUS) ×2 IMPLANT
BENZOIN TINCTURE PRP APPL 2/3 (GAUZE/BANDAGES/DRESSINGS) IMPLANT
BLADE CLIPPER SURG (BLADE) ×3 IMPLANT
BLADE SAW GIGLI 16 STRL (MISCELLANEOUS) IMPLANT
BLADE SURG 15 STRL LF DISP TIS (BLADE) IMPLANT
BLADE SURG 15 STRL SS (BLADE) ×6
BLADE ULTRA TIP 2M (BLADE) ×3 IMPLANT
BNDG GAUZE ELAST 4 BULKY (GAUZE/BANDAGES/DRESSINGS) ×2 IMPLANT
BRUSH SCRUB EZ 1% IODOPHOR (MISCELLANEOUS) ×3 IMPLANT
BTRY SRG DRVR 1.5 IQ (MISCELLANEOUS) ×1
BUR ACORN 6.0 PRECISION (BURR) ×2 IMPLANT
BUR ACORN 6.0MM PRECISION (BURR) ×1
BUR ADDG 1.1 (BURR) IMPLANT
BUR ADDG 1.1MM (BURR)
BUR MATCHSTICK NEURO 3.0 LAGG (BURR) ×2 IMPLANT
BUR ROUND FLUTED 4 SOFT TCH (BURR) ×1 IMPLANT
BUR ROUND FLUTED 4MM SOFT TCH (BURR) ×1
BUR SPIRAL ROUTER 2.3 (BUR) IMPLANT
BUR SPIRAL ROUTER 2.3MM (BUR)
CANISTER SUCT 3000ML PPV (MISCELLANEOUS) ×3 IMPLANT
CATH VENTRIC 35X38 W/TROCAR LG (CATHETERS) IMPLANT
CLAW UNIV SL SPETZLER MIC (INSTRUMENTS) ×2 IMPLANT
CLIP TI MEDIUM 6 (CLIP) IMPLANT
CONT SPEC 4OZ CLIKSEAL STRL BL (MISCELLANEOUS) ×3 IMPLANT
COVER MAYO STAND STRL (DRAPES) IMPLANT
DECANTER SPIKE VIAL GLASS SM (MISCELLANEOUS) ×3 IMPLANT
DRAIN SNY WOU 7FLT (WOUND CARE) IMPLANT
DRAIN SUBARACHNOID (WOUND CARE) IMPLANT
DRAPE MICROSCOPE LEICA (MISCELLANEOUS) ×2 IMPLANT
DRAPE NEUROLOGICAL W/INCISE (DRAPES) ×3 IMPLANT
DRAPE PROXIMA HALF (DRAPES) ×3 IMPLANT
DRAPE STERI IOBAN 125X83 (DRAPES) IMPLANT
DRAPE SURG 17X23 STRL (DRAPES) IMPLANT
DRAPE WARM FLUID 44X44 (DRAPE) ×3 IMPLANT
DRSG ADAPTIC 3X8 NADH LF (GAUZE/BANDAGES/DRESSINGS) ×2 IMPLANT
DRSG TELFA 3X8 NADH (GAUZE/BANDAGES/DRESSINGS) ×3 IMPLANT
DURAMATRIX ONLAY 3X3 (Plate) ×2 IMPLANT
DURAPREP 6ML APPLICATOR 50/CS (WOUND CARE) ×3 IMPLANT
ELECT CAUTERY BLADE 6.4 (BLADE) ×3 IMPLANT
ELECT REM PT RETURN 9FT ADLT (ELECTROSURGICAL) ×3
ELECTRODE REM PT RTRN 9FT ADLT (ELECTROSURGICAL) ×1 IMPLANT
EVACUATOR 1/8 PVC DRAIN (DRAIN) IMPLANT
EVACUATOR SILICONE 100CC (DRAIN) IMPLANT
FORCEPS BIPOLAR SPETZLER 8 1.0 (NEUROSURGERY SUPPLIES) ×2 IMPLANT
GAUZE SPONGE 4X4 12PLY STRL (GAUZE/BANDAGES/DRESSINGS) ×3 IMPLANT
GAUZE SPONGE 4X4 16PLY XRAY LF (GAUZE/BANDAGES/DRESSINGS) IMPLANT
GLOVE BIO SURGEON STRL SZ8.5 (GLOVE) ×2 IMPLANT
GLOVE ECLIPSE 6.5 STRL STRAW (GLOVE) ×1 IMPLANT
GLOVE ECLIPSE 7.5 STRL STRAW (GLOVE) ×4 IMPLANT
GLOVE EXAM NITRILE LRG STRL (GLOVE) IMPLANT
GLOVE EXAM NITRILE MD LF STRL (GLOVE) IMPLANT
GLOVE EXAM NITRILE XL STR (GLOVE) IMPLANT
GLOVE EXAM NITRILE XS STR PU (GLOVE) IMPLANT
GLOVE INDICATOR 7.5 STRL GRN (GLOVE) ×10 IMPLANT
GLOVE OPTIFIT SS 8.0 STRL (GLOVE) ×2 IMPLANT
GLOVE SURG SS PI 7.0 STRL IVOR (GLOVE) ×8 IMPLANT
GOWN STRL REUS W/ TWL LRG LVL3 (GOWN DISPOSABLE) ×2 IMPLANT
GOWN STRL REUS W/ TWL XL LVL3 (GOWN DISPOSABLE) IMPLANT
GOWN STRL REUS W/TWL 2XL LVL3 (GOWN DISPOSABLE) IMPLANT
GOWN STRL REUS W/TWL LRG LVL3 (GOWN DISPOSABLE) ×3
GOWN STRL REUS W/TWL XL LVL3 (GOWN DISPOSABLE)
HEMOSTAT POWDER KIT SURGIFOAM (HEMOSTASIS) ×5 IMPLANT
HEMOSTAT SURGICEL 2X14 (HEMOSTASIS) ×2 IMPLANT
KIT BASIN OR (CUSTOM PROCEDURE TRAY) ×3 IMPLANT
KIT DRAIN CSF ACCUDRAIN (MISCELLANEOUS) IMPLANT
KIT ROOM TURNOVER OR (KITS) ×3 IMPLANT
KNIFE ARACHNOID DISP AM-24-S (MISCELLANEOUS) ×3 IMPLANT
NDL HYPO 25X1 1.5 SAFETY (NEEDLE) ×1 IMPLANT
NDL SPNL 18GX3.5 QUINCKE PK (NEEDLE) IMPLANT
NEEDLE HYPO 25X1 1.5 SAFETY (NEEDLE) ×3 IMPLANT
NEEDLE SPNL 18GX3.5 QUINCKE PK (NEEDLE) IMPLANT
NS IRRIG 1000ML POUR BTL (IV SOLUTION) ×7 IMPLANT
PACK CRANIOTOMY (CUSTOM PROCEDURE TRAY) ×3 IMPLANT
PAD DRESSING TELFA 3X8 NADH (GAUZE/BANDAGES/DRESSINGS) IMPLANT
PAD EYE OVAL STERILE LF (GAUZE/BANDAGES/DRESSINGS) IMPLANT
PATTIES SURGICAL .25X.25 (GAUZE/BANDAGES/DRESSINGS) IMPLANT
PATTIES SURGICAL .5 X.5 (GAUZE/BANDAGES/DRESSINGS) IMPLANT
PATTIES SURGICAL .5 X3 (DISPOSABLE) IMPLANT
PATTIES SURGICAL 1/4 X 3 (GAUZE/BANDAGES/DRESSINGS) IMPLANT
PATTIES SURGICAL 1X1 (DISPOSABLE) IMPLANT
PIN MAYFIELD SKULL DISP (PIN) ×2 IMPLANT
PLATE 1.5  2HOLE LNG NEURO (Plate) ×4 IMPLANT
PLATE 1.5 2HOLE LNG NEURO (Plate) IMPLANT
PLATE 1.5 6HOLE EXT ST L (Plate) ×2 IMPLANT
RUBBERBAND STERILE (MISCELLANEOUS) ×4 IMPLANT
SCREW SELF DRILL HT 1.5/4MM (Screw) ×12 IMPLANT
SET TUBING W/EXT DISP (INSTRUMENTS) ×2 IMPLANT
SPECIMEN JAR SMALL (MISCELLANEOUS) IMPLANT
SPONGE NEURO XRAY DETECT 1X3 (DISPOSABLE) IMPLANT
SPONGE SURGIFOAM ABS GEL 100 (HEMOSTASIS) ×3 IMPLANT
STAPLER VISISTAT 35W (STAPLE) ×5 IMPLANT
STOCKINETTE 6  STRL (DRAPES) ×2
STOCKINETTE 6 STRL (DRAPES) ×1 IMPLANT
SUT ETHILON 3 0 FSL (SUTURE) IMPLANT
SUT ETHILON 3 0 PS 1 (SUTURE) IMPLANT
SUT NURALON 4 0 TR CR/8 (SUTURE) ×7 IMPLANT
SUT SILK 0 TIES 10X30 (SUTURE) IMPLANT
SUT VIC AB 0 CT1 18XCR BRD8 (SUTURE) ×2 IMPLANT
SUT VIC AB 0 CT1 8-18 (SUTURE) ×6
SUT VIC AB 3-0 SH 8-18 (SUTURE) ×8 IMPLANT
SYR CONTROL 10ML LL (SYRINGE) ×3 IMPLANT
TAPE SURG TRANSPORE 1 IN (GAUZE/BANDAGES/DRESSINGS) IMPLANT
TAPE SURGICAL TRANSPORE 1 IN (GAUZE/BANDAGES/DRESSINGS) ×2
TIP SONASTAR STD MISONIX 1.9 (TRAY / TRAY PROCEDURE) IMPLANT
TOWEL OR 17X24 6PK STRL BLUE (TOWEL DISPOSABLE) ×3 IMPLANT
TOWEL OR 17X26 10 PK STRL BLUE (TOWEL DISPOSABLE) ×3 IMPLANT
TRAY FOLEY W/METER SILVER 14FR (SET/KITS/TRAYS/PACK) ×3 IMPLANT
TUBE CONNECTING 12'X1/4 (SUCTIONS) ×1
TUBE CONNECTING 12X1/4 (SUCTIONS) ×2 IMPLANT
UNDERPAD 30X30 INCONTINENT (UNDERPADS AND DIAPERS) ×1 IMPLANT
WATER STERILE IRR 1000ML POUR (IV SOLUTION) ×3 IMPLANT

## 2015-05-13 NOTE — Anesthesia Postprocedure Evaluation (Signed)
  Anesthesia Post-op Note  Patient: Yolanda [name]  Procedure(s) Performed: Procedure(s): Left frontotemporal Craniotomy for Meningioma resection (Left)  Patient Location: PACU  Anesthesia Type:General  Level of Consciousness: awake and sedated  Airway and Oxygen Therapy: Patient Spontanous Breathing  Post-op Pain: none  Post-op Assessment: Post-op Vital signs reviewed, Patient's Cardiovascular Status Stable, Respiratory Function Stable, Patent Airway, No signs of Nausea or vomiting and Pain level controlled              Post-op Vital Signs: Reviewed and stable  Last Vitals:  Filed Vitals:   05/13/15 1330  BP: 124/49  Pulse: 49  Temp:   Resp: 13    Complications: No apparent anesthesia complications

## 2015-05-13 NOTE — Transfer of Care (Signed)
Immediate Anesthesia Transfer of Care Note  Patient: Yolanda [name]  Procedure(s) Performed: Procedure(s): Left frontotemporal Craniotomy for Meningioma resection (Left)  Patient Location: PACU  Anesthesia Type:General  Level of Consciousness: sedated  Airway & Oxygen Therapy: Patient Spontanous Breathing and Patient connected to face mask oxygen  Post-op Assessment: Report given to RN and Post -op Vital signs reviewed and stable  Post vital signs: Reviewed and stable  Last Vitals:  Filed Vitals:   05/13/15 0557  BP:   Pulse:   Temp: 84.2 C    Complications: No apparent anesthesia complications

## 2015-05-13 NOTE — Op Note (Signed)
PREOP DIAGNOSIS:  1. Planum Sphenoidale Meningioma   POSTOP DIAGNOSIS: Same  PROCEDURE: 1. Left frontotemporal craniotomy for resection of meningioma 2. Unroofing of left optic canal for decompression of optic nerve 3. Use of intraoperative microscope for microdissection  SURGEON: Dr. Consuella Lose, MD  ASSISTANT: Dr. Earle Gell, M.D.  ANESTHESIA: General Endotracheal  EBL: 250cc  SPECIMENS: Skull base meningioma for permanent pathology  DRAINS: None   COMPLICATIONS: None immediate  CONDITION: Hemodynamically stable to post anesthesia care unit  HISTORY: Yolanda [name] is a 80 y.o. female initially presenting to the outpatient neurosurgery clinic after evaluation by her ophthalmologist demonstrated severe visual loss in the left eye. MRI demonstrated a meningioma at the anterior skull base with possible invasion into the left optic canal. With these findings, surgical resection and decompression of the optic nerve was indicated. The risks and benefits were described in detail to the patient and her family. After all questions were answered, informed consent was obtained.  PROCEDURE IN DETAIL: After informed consent was obtained and witnessed, the patient was brought to the operating room. After induction of general anesthesia, the [place] headholder was applied to the patient, and she was positioned on the operative table in the supine position. All pressure points were meticulously padded. Previous skin incision from her facelift was then marked out and prepped and draped in the usual sterile fashion.  After timeout was conducted, skin incision was infiltrated with local aesthetic with epinephrine. Skin incision was then made sharply, and Bovie electrocautery was used to dissect the subcutaneous tissue and the galea. A cutaneous flap was then elevated, and the temporalis fascia was divided, and interfascial dissection was carried out. The skin flap was then retracted  anteriorly. The temporalis fascia was then incised, and the temporalis reflected inferiorly. Standard pterional craniotomy was then fashioned. The dura was somewhat adherent to the bone, and multiple small dural defects were created during elevation of the craniotomy flap. The lesser wing of the sphenoid was then drilled down. Dura was then incised in a curvilinear fashion and reflected over the orbital roof.  At this point the microscope was draped sterilely and brought into the field, and the remainder of the case was done under the microscope using microdissection. Utilizing a subfrontal approach, the optic nerve was identified, and arachnoid dissection was carried out to release CSF and achieve brain relaxation. The left carotid artery was identified, and dissection was carried medially and the tumor on the planum sphenoidale was identified. Further dissection towards the right side identified the medial aspect of thright carotid artery and the right optic nerve was identified. Arachnoid was dissected away from the superior aspect of the tumor. Bipolar electrocautery was then used to disconnect the tumor from the skull base, and the right-sided neurovascular structures.  At this point, the dura over the left optic canal was coagulated and incised. This was then reflected posteriorly. Using the Sonopet with bone-cutting tool, the left optic canal was unroofed. The dura overlying the optic nerve was then carefully incised. A small sliver of tumor was seen extending into the medial aspect of the left optic canal, and underneath the left optic nerve. The tumor was then removed nearly in its entirety. There did appear to be a submillimeter portion of tumor which was somewhat adherent to the medial aspect of the left carotid artery underneath the optic nerve which I did not dissect away from the artery.  Having completed tumor resection and decompression of the optic nerve, the wound is  irrigated with copious  amounts of normal saline irrigation. Good hemostasis was confirmed on the brain surface. The dura was then  reapproximated using a combination of interrupted 4-0 Nurolon stitches. A piece of Duramatrix was then placed over the dural surface. Muscle was then closed using interrupted 0 Vicryl stitches, and the galea was closed using interrupted 3-0 Vicryl sutures. The skin was closed using standard surgical skin staples. Sterile dressing was then applied after the [place] head holder was removed. The patient was then transferred to the stretcher and taken to the PACU in stable hemodynamic condition.  At the end of the case all sponge, needle, cottonoid, and instrument counts were correct.

## 2015-05-13 NOTE — Progress Notes (Signed)
Dt turk at bedsside looked at cxr states xray ifs fine

## 2015-05-13 NOTE — H&P (Signed)
CC:  Left eye vision loss  HPI: Yolanda [name] is a 80 year old woman seen for initial consultation in the office. She comes in as a referral from her ophthalmologist, Dr. Katy Fitch, after she began noticing decreased vision in the left eye over the last several months. She says she had her last ophthalmologic evaluation in September at which time she was stable, with bilateral 20/30 vision. She says she really noticed that the vision in the left eye was worse about 2 months ago in May. Her last visual exam demonstrated 20/400 acuity in the left eye. Funduscopic exam is normal, and MRI was ordered demonstrating a very small planum sphenoidale meningioma. She was therefore referred for neurosurgical evaluation.   Aside from the visual loss, she has some mild headaches which are unchanged in character or severity. She has not had any numbness, tingling, or weakness of the arms or legs. She does not report any seizure-like activity.  Her case was reviewed with Dr. Katy Fitch and in the multi-disciplinary neuro-oncology conference and the decision was made to proceed with surgical resection and decompression of the optic nerve.   PMH: Past Medical History  Diagnosis Date  . Thyroid nodule 8/08  . Lung nodule 1/09  . Fuch's endothelial dystrophy 04/2009    Dr, Shanon Rosser   . Osteoporosis   . CAD (coronary artery disease)     LAD lession   . Osteopenia   . Hypertension   . Hyperlipidemia   . Esophagitis, acute   . GERD (gastroesophageal reflux disease) 11/00  . Gastritis 11/00  . Diverticulosis 11/00  . Hemorrhoids 8/05  . NSVD (normal spontaneous vaginal delivery) 1967    female   . Allergy     seasonal  . Anemia   . Arthritis   . Cancer     skin  . Cataract     removed bilaterally  . Headache     PSH: Past Surgical History  Procedure Laterality Date  . Bilateral cataract surgery  06/2007    DR. Groat   . Bilateral cataract surgery  10/08    Dr. Katy Fitch   . Facial cosmetic surgery  7/96    Dr.  Mikle Bosworth    . Cardiac cath with angioplasty & stent replacement  4/98  . Cardiac cath 40-50%lad  6/07  . Ct lung screening  6/07  . Colonoscopy    . Meniscus repair      SH: Social History  Substance Use Topics  . Smoking status: Former Research scientist (life sciences)  . Smokeless tobacco: Never Used  . Alcohol Use: No    MEDS: Prior to Admission medications   Medication Sig Start Date End Date Taking? Authorizing Provider  acyclovir ointment (ZOVIRAX) 5 % Apply 1 application topically every 3 (three) hours. Apply frequently and sparingly to cold sores as needed. 12/09/13  Yes Chipper Herb, MD  aspirin EC 81 MG tablet Take 81 mg by mouth daily.   Yes Historical Provider, MD  calcium carbonate 200 MG capsule Take 1 capsule (200 mg total) by mouth daily. 10/16/13  Yes Tammy Eckard, PHARMD  Cholecalciferol (VITAMIN D3) 1000 UNITS CAPS Take by mouth 2 (two) times daily.     Yes Historical Provider, MD  CO ENZYME Q-10 PO Take 1 capsule by mouth daily.    Yes Historical Provider, MD  colesevelam (WELCHOL) 625 MG tablet Take 3 tablets (1,875 mg total) by mouth 2 (two) times daily with a meal. Take one tablet by mouth three times a day 11/20/14  Yes  Chipper Herb, MD  fluticasone [facility]) 50 MCG/ACT nasal spray 1-2 sprays each nostril daily as directed for allergic rhinitis 05/04/15  Yes Chipper Herb, MD  hydrochlorothiazide (MICROZIDE) 12.5 MG capsule Take 1 capsule (12.5 mg total) by mouth daily. 08/14/14  Yes Chipper Herb, MD  IRON PO Take 1 tablet by mouth every other day.    Yes Historical Provider, MD  Multiple Vitamin (MULTI-VITAMIN PO) Take 1 tablet by mouth daily.    Yes Historical Provider, MD  omega-3 acid ethyl esters (LOVAZA) 1 G capsule Take 2 capsules (2 g total) by mouth 2 (two) times daily. 01/04/15  Yes Chipper Herb, MD  pantoprazole (PROTONIX) 40 MG tablet TAKE 1 TABLET DAILY 08/14/14  Yes Chipper Herb, MD  pravastatin (PRAVACHOL) 80 MG tablet TAKE 1 TABLET DAILY 09/26/14  Yes Chipper Herb, MD   valACYclovir (VALTREX) 1000 MG tablet Take 1 tablet (1,000 mg total) by mouth daily. As directed Patient taking differently: Take 1,000 mg by mouth daily as needed. As directed 04/14/14  Yes Chipper Herb, MD  azelastine (ASTELIN) 0.1 % nasal spray Place 1 spray into both nostrils 2 (two) times daily. Use in each nostril as directed Patient taking differently: Place 1 spray into both nostrils 2 (two) times daily as needed for rhinitis or allergies. Use in each nostril as directed 04/14/14   Chipper Herb, MD  denosumab (PROLIA) 60 MG/ML SOLN injection Inject 60 mg into the skin every 6 (six) months. Administer in upper arm, thigh, or abdomen    Historical Provider, MD    ALLERGY: Allergies  Allergen Reactions  . Lipitor [Atorvastatin Calcium]     Headaches    . Sulfonamide Derivatives   . K+ Care [Potassium Chloride] Diarrhea  . Sulfa Antibiotics Rash    Fever blisters    ROS: ROS  NEUROLOGIC EXAM: Awake, alert, oriented Memory and concentration grossly intact Speech fluent, appropriate CN grossly intact Motor exam: Upper Extremities Deltoid Bicep Tricep Grip  Right 5/5 5/5 5/5 5/5  Left 5/5 5/5 5/5 5/5   Lower Extremity IP Quad PF DF EHL  Right 5/5 5/5 5/5 5/5 5/5  Left 5/5 5/5 5/5 5/5 5/5   Sensation grossly intact to LT  [facility]: MRI of the brain demonstrates a small homogeneously enhancing likely meningioma of the planum sphenoidale, slightly eccentric to the left. There may be a small sliver of dural enhancement which extends into the pre-case kinematic left optic nerve, however there is no obvious tumor invasion into the optic canal, or optic nerve compression.  IMPRESSION: 80 year old woman with fairly rapid progression of visual loss in the left eye likely related to a small planum meningioma  PLAN: - Proceed with left frontotemporal craniotomy for resection of meningioma, decompression of optic nerve - ICU postop  I have reviewed the MRI findings with the  patient. I've discussed with her the fact that the tumor does not appear to be pressing on the nerve, but that there may be an element of nerve swelling or irritation. Risks of a surgical resection and decompression of the left optic nerve were reviewed including the risk of worsening vision in the left eye, the unlikely risk of worsening vision in the right eye, stroke, bleeding, infection, suture, and hydrocephalus were all reviewed.

## 2015-05-13 NOTE — Anesthesia Procedure Notes (Addendum)
Anesthesia Procedure Note The patient was identified and consent obtained.  TO was performed, and full barrier precautions were used.  The skin was anesthetized with lidocaine.  Once the vein was located with the 22 ga., the wire was inserted into the vein. The insertion site was dilated and the CVL was carefully inserted and sutured in place. The patient tolerated the procedure well.  CXR was ordered for PACU. Ultrasound guidance: relevant anatomy identified, needle position confirmed, vessel patent, wire seen inside the vessel.  Images printed for the medical record.  Start: 0715 End: Estill Cotta, MD  Procedure Name: Intubation Date/Time: 05/13/2015 8:01 AM Performed by: Maude Leriche D Pre-anesthesia Checklist: Patient identified, Emergency Drugs available, Suction available, Patient being monitored and Timeout performed Patient Re-evaluated:Patient Re-evaluated prior to inductionOxygen Delivery Method: Circle system utilized Preoxygenation: Pre-oxygenation with 100% oxygen Intubation Type: IV induction Ventilation: Mask ventilation without difficulty Laryngoscope Size: Mac and 3 Grade View: Grade I Tube type: Subglottic suction tube Tube size: 7.0 mm Number of attempts: 1 Airway Equipment and Method: Stylet Placement Confirmation: ETT inserted through vocal cords under direct vision,  positive ETCO2 and breath sounds checked- equal and bilateral Secured at: 20 (@lip ) cm Tube secured with: Tape Dental Injury: Teeth and Oropharynx as per pre-operative assessment  Comments: Intubation by SRNA J. Waggoner

## 2015-05-13 NOTE — Anesthesia Preprocedure Evaluation (Addendum)
Anesthesia Evaluation  Patient identified by MRN, date of birth, ID band Patient awake    Reviewed: Allergy & Precautions, NPO status , Patient's Chart, lab work & pertinent test results  Airway Mallampati: I  TM Distance: >3 FB Neck ROM: Full    Dental  (+) Teeth Intact, Dental Advisory Given   Pulmonary former smoker,  breath sounds clear to auscultation  Pulmonary exam normal       Cardiovascular hypertension, + CAD, + Cardiac Stents and + Peripheral Vascular Disease Normal cardiovascular examRhythm:Regular Rate:Normal  Hyperlipidemia LAD stent in 1998 EKG: NSR Stress test 2013: EF 83%, normal LV function Cath 2007: nonobstructive CAD   Neuro/Psych  Headaches, meningioma negative psych ROS   GI/Hepatic Neg liver ROS, GERD-  Medicated,  Endo/Other  History of thyroid nodule  Renal/GU negative Renal ROS     Musculoskeletal  (+) Arthritis -, Osteoarthritis,  osteoporosis   Abdominal   Peds  Hematology   Anesthesia Other Findings   Reproductive/Obstetrics                           Anesthesia Physical Anesthesia Plan  ASA: III  Anesthesia Plan: General   Post-op Pain Management:    Induction: Intravenous  Airway Management Planned: Oral ETT  Additional Equipment: Arterial line and CVP  Intra-op Plan:   Post-operative Plan: Extubation in OR  Informed Consent: I have reviewed the patients History and Physical, chart, labs and discussed the procedure including the risks, benefits and alternatives for the proposed anesthesia with the patient or authorized representative who has indicated his/her understanding and acceptance.   Dental advisory given  Plan Discussed with: Anesthesiologist, CRNA and Surgeon  Anesthesia Plan Comments: (Risks, benefits, alternatives to general anesthesia discussed with patient.  All questions answered.  Discussed arterial line, possible CVL.  Patient  wishes to proceed.)       Anesthesia Quick Evaluation

## 2015-05-14 ENCOUNTER — Encounter (HOSPITAL_COMMUNITY): Payer: Self-pay | Admitting: Neurosurgery

## 2015-05-14 ENCOUNTER — Inpatient Hospital Stay (HOSPITAL_COMMUNITY): Payer: Medicare Other

## 2015-05-14 LAB — MRSA PCR SCREENING: MRSA BY PCR: NEGATIVE

## 2015-05-14 MED ORDER — FLUTICASONE PROPIONATE 50 MCG/ACT NA SUSP
1.0000 | Freq: Every day | NASAL | Status: DC
  Administered 2015-05-14 – 2015-05-16 (×2): 1 via NASAL
  Filled 2015-05-14 (×2): qty 16

## 2015-05-14 MED ORDER — GADOBENATE DIMEGLUMINE 529 MG/ML IV SOLN
10.0000 mL | Freq: Once | INTRAVENOUS | Status: AC | PRN
  Administered 2015-05-14: 10 mL via INTRAVENOUS

## 2015-05-14 MED ORDER — FLUTICASONE PROPIONATE 50 MCG/ACT NA SUSP
1.0000 | Freq: Every day | NASAL | Status: DC
  Filled 2015-05-14: qty 16

## 2015-05-14 NOTE — Care Management Note (Signed)
Case Management Note  Patient Details  Name: Yolanda [name] MRN: [id number] Date of Birth: 1935/04/29  Subjective/Objective:   Pt s/p Lt craniotomy on 05/13/15 for meningioma resection.  PTA, pt resides at home with spouse.                   Action/Plan: Will follow for discharge planning as pt progresses.  Will need PT/OT when able to tolerate.    Expected Discharge Date:                  Expected Discharge Plan:  [place]  In-House Referral:     Discharge planning Services  CM Consult  Post Acute Care Choice:    Choice offered to:     DME Arranged:    DME Agency:     HH Arranged:    [place] Agency:     Status of Service:  In process, will continue to follow  Medicare Important Message Given:    Date Medicare IM Given:    Medicare IM give by:    Date Additional Medicare IM Given:    Additional Medicare Important Message give by:     If discussed at [place] of Stay Meetings, dates discussed:    Additional Comments:  Reinaldo Raddle, RN, BSN  Trauma/Neuro ICU Case Manager [phone]

## 2015-05-14 NOTE — Progress Notes (Signed)
Pt seen and examined. No issues overnight. Pt reports unchanged left eye vision, no significant improvement. Has some HA.  EXAM: Temp:  [96.3 F (35.7 C)-99.5 F (37.5 C)] 98.8 F (37.1 C) (08/12 0734) Pulse Rate:  [46-91] 61 (08/12 0800) Resp:  [11-33] 16 (08/12 0800) BP: (108-165)/(42-119) 116/61 mmHg (08/12 0800) SpO2:  [94 %-100 %] 95 % (08/12 0800) Arterial Line BP: (127-204)/(51-87) 155/61 mmHg (08/12 0800) Weight:  [53.7 kg (118 lb 6.2 oz)] 53.7 kg (118 lb 6.2 oz) (08/11 1330) Intake/Output      08/11 0701 - 08/12 0700 08/12 0701 - 08/13 0700   I.V. (mL/kg) 2485 (46.3)    IV Piggyback 305    Total Intake(mL/kg) 2790 (52)    Urine (mL/kg/hr) 2100 (1.6) 125 (1)   Blood 100 (0.1)    Total Output 2200 125   Net +590 -125         Awake, alert, oriented Speech fluent CN intact x decreased acuity OS, can count fingers at 3 ft. 5/5 BUE/BLE  LABS: Lab Results  Component Value Date   CREATININE 0.81 05/13/2015   BUN 11 05/06/2015   NA 136 05/06/2015   K 4.4 05/06/2015   CL 101 05/06/2015   CO2 31 05/06/2015   Lab Results  Component Value Date   WBC 6.8 05/13/2015   HGB 12.8 05/13/2015   HCT 37.2 05/13/2015   MCV 94.9 05/13/2015   PLT 121* 05/13/2015    IMPRESSION: - 80 y.o. female POD# 1 s/p resection planum meningioma, decompression optic nerve, neurologically at baseline  PLAN: - Mobilize today - D/C A-line, d/c CVC - Postop MRI today - Will likely transfer to floor tomorrow.

## 2015-05-15 NOTE — Progress Notes (Signed)
Recd patient from previous unit accompanied by RN tech and family. Oriented to unit and safety plan. Bed in low position, call bell in reach, and bed alarm on.

## 2015-05-15 NOTE — Care Management Note (Signed)
Case Management Note  Patient Details  Name: Yolanda [name] MRN: [id number] Date of Birth: October 01, 1935  Subjective/Objective:                    Action/Plan:   Expected Discharge Date:                  Expected Discharge Plan:  [place]  In-House Referral:     Discharge planning Services  CM Consult  Post Acute Care Choice:    Choice offered to:     DME Arranged:    DME Agency:     HH Arranged:    HH Agency:     Status of Service:  In process, will continue to follow  Medicare Important Message Given:   yes Date Medicare IM Given:   05/15/15 Medicare IM give by:   Frann Rider, RN, BSN Date Additional Medicare IM Given:    Additional Medicare Important Message give by:     If discussed at [place] of Stay Meetings, dates discussed:    Additional Comments:  Norina Buzzard, RN 05/15/2015, 2:53 PM

## 2015-05-15 NOTE — Progress Notes (Signed)
Report called to Thelma Barge, RN on 4N. Family at bedside and to transfer with patient. All belongings with family.

## 2015-05-15 NOTE — Progress Notes (Signed)
Pt seen and examined. No issues overnight. No c/o this am.  EXAM: Temp:  [97.4 F (36.3 C)-99 F (37.2 C)] 97.4 F (36.3 C) (08/13 0749) Pulse Rate:  [55-74] 62 (08/13 0900) Resp:  [14-24] 15 (08/13 0900) BP: (103-156)/(48-82) 129/64 mmHg (08/13 0900) SpO2:  [89 %-99 %] 98 % (08/13 0900) Intake/Output      08/12 0701 - 08/13 0700 08/13 0701 - 08/14 0700   P.O. 1560    I.V. (mL/kg) 1800 (33.5) 150 (2.8)   IV Piggyback 205    Total Intake(mL/kg) 3565 (66.4) 150 (2.8)   Urine (mL/kg/hr) 1350 (1) 200 (1.4)   Blood     Total Output 1350 200   Net +2215 -50        Urine Occurrence 2 x     Awake, alert, oriented Speech fluent CN intact, with improved acuity. Able to read clock from bed (~8-21ft) MAE well, good strength Wound c/d/i  LABS: Lab Results  Component Value Date   CREATININE 0.81 05/13/2015   BUN 11 05/06/2015   NA 136 05/06/2015   K 4.4 05/06/2015   CL 101 05/06/2015   CO2 31 05/06/2015   Lab Results  Component Value Date   WBC 6.8 05/13/2015   HGB 12.8 05/13/2015   HCT 37.2 05/13/2015   MCV 94.9 05/13/2015   PLT 121* 05/13/2015    IMAGING: Postop MRI reviewed, no identifiable tumor.  IMPRESSION: - 80 y.o. female s/p meningioma resection and optic nerve decompression. Doing well, with improvement in vision.  PLAN: - Transfer to floor today - Cont to mobilize - Maybe home tomorrow.

## 2015-05-15 NOTE — Plan of Care (Signed)
Problem: Phase II Progression Outcomes Goal: Neuro exam at baseline or improved Outcome: Progressing Pt. States that her vision has improved more today; she is able to read large print across the room clearly- still has small amount of blurriness. VSS at this time. Set to transfer to floor. Will continue to monitor pt. And update healthcare team as necessary.

## 2015-05-16 NOTE — Evaluation (Signed)
Physical Therapy Evaluation Patient Details Name: JAQUELINE UBER MRN: [id number] DOB: May 15, 1935 Today's Date: 05/16/2015   History of Present Illness  80 y.o. female s/p meningioma resection and optic nerve decompression  Clinical Impression  Patient is s/p above procedure presenting with functional limitations due to the deficits listed below (see PT Problem List). Demonstrates moderate instability while ambulating. Improves significantly with a rolling walker for support. Practiced stair training today. Will benefit from follow-up therapy session prior to d/c, and HHPT for continued progression of gait, balance, and safety with mobility.  Follow Up Recommendations Home health PT;Supervision/Assistance - 24 hour    Equipment Recommendations  None recommended by PT    Recommendations for Other Services OT consult     Precautions / Restrictions Precautions Precautions: Fall Restrictions Weight Bearing Restrictions: No      Mobility  Bed Mobility Overal bed mobility: Modified Independent             General bed mobility comments: extra time  Transfers Overall transfer level: Needs assistance Equipment used: None Transfers: Sit to/from Stand Sit to Stand: Supervision         General transfer comment: supervision for safety. Demonstrates some sway. reaches for furniture in room. Improved stability when hands placed on RW.  Ambulation/Gait Ambulation/Gait assistance: Min assist Ambulation Distance (Feet): 250 Feet Assistive device: Rolling walker (2 wheeled);1 person hand held assist Gait Pattern/deviations: Step-through pattern;Decreased stride length;Scissoring;Staggering left;Staggering right;Drifts right/left Gait velocity: decreased Gait velocity interpretation: Below normal speed for age/gender General Gait Details: Moderately unstable, requiring min assist with hand held support. VC for awareness. Improved greatly with RW use for support. VC for walker  placement for proximity. No loss of balance with this device for support.  Stairs Stairs: Yes Stairs assistance: Min assist Stair Management: No rails;Step to pattern;Backwards;With walker Number of Stairs: 2 (x2) General stair comments: Educated on posterior approach with RW for support. Min assist to block RW only. VC for hand placement and technique.  Wheelchair Mobility    Modified Rankin (Stroke Patients Only)       Balance Overall balance assessment: Needs assistance Sitting-balance support: No upper extremity supported;Feet supported Sitting balance-Leahy Scale: Good     Standing balance support: No upper extremity supported Standing balance-Leahy Scale: Fair                               Pertinent Vitals/Pain Pain Assessment: 0-10 Pain Score: 2  Pain Location: abdomen Pain Descriptors / Indicators: Aching Pain Intervention(s): Monitored during session;Repositioned    Home Living Family/patient expects to be discharged to:: Private residence Living Arrangements: Spouse/significant other Available Help at Discharge: Family;Available 24 hours/day Type of Home: House Home Access: Stairs to enter Entrance Stairs-Rails: None Entrance Stairs-Number of Steps: 3 Home Layout: One level Home Equipment: Walker - 2 wheels;Shower seat      Prior Function Level of Independence: Independent               Hand Dominance   Dominant Hand: Right    Extremity/Trunk Assessment   Upper Extremity Assessment: Defer to OT evaluation           Lower Extremity Assessment: Overall WFL for tasks assessed         Communication   Communication: No difficulties  Cognition Arousal/Alertness: Awake/alert Behavior During Therapy: WFL for tasks assessed/performed Overall Cognitive Status: Within Functional Limits for tasks assessed  General Comments General comments (skin integrity, edema, etc.): Concerned about leaving too  soon. Hopes to ambulate more.    Exercises General Exercises - Lower Extremity Ankle Circles/Pumps: AROM;Both;10 reps;Seated      Assessment/Plan    PT Assessment Patient needs continued PT services  PT Diagnosis Difficulty walking;Abnormality of gait   PT Problem List Decreased activity tolerance;Decreased balance;Decreased mobility;Decreased coordination;Decreased knowledge of use of DME;Pain  PT Treatment Interventions DME instruction;Gait training;Stair training;Functional mobility training;Therapeutic activities;Therapeutic exercise;Balance training;Neuromuscular re-education;Patient/family education   PT Goals (Current goals can be found in the Care Plan section) Acute Rehab PT Goals Patient Stated Goal: Walk better PT Goal Formulation: With patient Time For Goal Achievement: 05/30/15 Potential to Achieve Goals: Good    Frequency Min 5X/week   Barriers to discharge        Co-evaluation               End of Session Equipment Utilized During Treatment: Gait belt Activity Tolerance: Patient tolerated treatment well Patient left: in chair;with call bell/phone within reach;with chair alarm set Nurse Communication: Mobility status;Other (comment) (Please have staff ambulate with pt tonight)         Time: 7616-0737 PT Time Calculation (min) (ACUTE ONLY): 24 min   Charges:   PT Evaluation $Initial PT Evaluation Tier I: 1 Procedure PT Treatments $Gait Training: 8-22 mins   PT G CodesEllouise Newer 05/16/2015, 7:12 PM Camille Bal [place], [place]

## 2015-05-16 NOTE — Progress Notes (Signed)
No issues overnight. Pt reports vision is unchanged from yesterday.  EXAM:  BP 172/84 mmHg  Pulse 65  Temp(Src) 98 F (36.7 C) (Oral)  Resp 20  Ht 5\' 2"  (1.575 m)  Wt 53.7 kg (118 lb 6.2 oz)  BMI 21.65 kg/m2  SpO2 99%  Awake, alert, oriented  Speech fluent, appropriate  CN grossly intact x acuity OS 5/5 BUE/BLE   IMPRESSION:  80 y.o. female POD# 3 s/p meningioma resection, doing well  PLAN: - Cont to mobilize. Will get PT/OT - Maybe home tomorrow after therapy eval

## 2015-05-17 MED ORDER — MAGNESIUM HYDROXIDE 400 MG/5ML PO SUSP
30.0000 mL | Freq: Every day | ORAL | Status: DC | PRN
  Administered 2015-05-17: 30 mL via ORAL
  Filled 2015-05-17: qty 30

## 2015-05-17 MED ORDER — HYDROCODONE-ACETAMINOPHEN 5-325 MG PO TABS: 1.0000 | ORAL_TABLET | ORAL | Status: DC | PRN

## 2015-05-17 MED ORDER — LEVETIRACETAM 500 MG PO TABS: 500.0000 mg | ORAL_TABLET | Freq: Two times a day (BID) | ORAL | Status: DC

## 2015-05-17 MED ORDER — METHYLPREDNISOLONE 4 MG PO TBPK: ORAL_TABLET | ORAL | Status: DC

## 2015-05-17 MED ORDER — LEVETIRACETAM 500 MG PO TABS
500.0000 mg | ORAL_TABLET | Freq: Two times a day (BID) | ORAL | Status: DC
  Administered 2015-05-17: 500 mg via ORAL
  Filled 2015-05-17: qty 1

## 2015-05-17 NOTE — Discharge Summary (Signed)
Physician Discharge Summary  Patient ID: Yolanda [name] MRN: [id number] DOB/AGE: [birth year] 80 y.o.  Admit date: 05/13/2015 Discharge date: 05/17/2015  Admission Diagnoses: Meningioma  Discharge Diagnoses: Same Active Problems:   Meningioma   Discharged Condition: Stable  Hospital Course:  Yolanda [name] is a 80 y.o. female electively admitted after uncomplicated craniotomy for resection of meningioma. She had a largely unremarkable hospital course. She was monitored in the ICU, transferred to the floor, and evaluated to PT/OT. She was deemed stable for d/c home with home PT.  Treatments: Surgery - Left craniotomy for resection of meningioma.  Discharge Exam: Blood pressure 173/89, pulse 58, temperature 97.7 F (36.5 C), temperature source Oral, resp. rate 18, height 5\' 2"  (1.575 m), weight 53.7 kg (118 lb 6.2 oz), SpO2 99 %. Awake, alert, oriented Speech fluent, appropriate CN grossly intact 5/5 BUE/BLE Wound c/d/i   Disposition: 01-Home or Self Care  Discharge Instructions    Ambulatory referral to [facility]    Complete by:  As directed   Please evaluate Yolanda [name] for admission to [facility].  Disciplines requested: Physical Therapy  Services to provide: Evaluate  Physician to follow patient's care (the person listed here will be responsible for signing ongoing orders): Referring Provider  Requested Start of Care Date: Tomorrow  I certify that this patient is under my care and that I, or a Nurse Practitioner or Physician's Assistant working with me, had a face-to-face encounter that meets the physician face-to-face requirements with patient on 05/17/15. The encounter with the patient was in whole, or in part for the following medical condition(s) which is the primary reason for home health care (List medical condition): Meningioma, optic neuritis  Does the patient have Medicare or Medicaid?:  Yes  The encounter with the patient was in whole, or in part, for  the following medical condition, which is the primary reason for home health care:  meningioma  Reason for Medically Necessary Home Health Services:  Therapy- Home Adaptation to Facilitate Safety  My clinical findings support the need for the above services:  Unable to leave home safely without assistance and/or assistive device  I certify that, based on my findings, the following services are medically necessary home health services:  Physical therapy  Further, I certify that my clinical findings support that this patient is homebound due to:  Unsafe ambulation due to balance issues            Medication List    TAKE these medications        acyclovir ointment 5 %  Commonly known as:  ZOVIRAX  Apply 1 application topically every 3 (three) hours. Apply frequently and sparingly to cold sores as needed.     aspirin EC 81 MG tablet  Take 81 mg by mouth daily.     azelastine 0.1 % nasal spray  Commonly known as:  ASTELIN  Place 1 spray into both nostrils 2 (two) times daily. Use in each nostril as directed     calcium carbonate 200 MG capsule  Take 1 capsule (200 mg total) by mouth daily.     CO ENZYME Q-10 PO  Take 1 capsule by mouth daily.     colesevelam 625 MG tablet  Commonly known as:  WELCHOL  Take 3 tablets (1,875 mg total) by mouth 2 (two) times daily with a meal. Take one tablet by mouth three times a day     denosumab 60 MG/ML Soln injection  Commonly known as:  PROLIA  Inject 60 mg into the skin every 6 (six) months. Administer in upper arm, thigh, or abdomen     fluticasone 50 MCG/ACT nasal spray  Commonly known as:  FLONASE  1-2 sprays each nostril daily as directed for allergic rhinitis     hydrochlorothiazide 12.5 MG capsule  Commonly known as:  MICROZIDE  Take 1 capsule (12.5 mg total) by mouth daily.     HYDROcodone-acetaminophen 5-325 MG per tablet  Commonly known as:  NORCO/VICODIN  Take 1 tablet by mouth every 4 (four) hours as needed for moderate  pain.     IRON PO  Take 1 tablet by mouth every other day.     levETIRAcetam 500 MG tablet  Commonly known as:  KEPPRA  Take 1 tablet (500 mg total) by mouth 2 (two) times daily.     methylPREDNISolone 4 MG Tbpk tablet  Commonly known as:  MEDROL DOSEPAK  Take tapering dose as instructed on package     MULTI-VITAMIN PO  Take 1 tablet by mouth daily.     omega-3 acid ethyl esters 1 G capsule  Commonly known as:  LOVAZA  Take 2 capsules (2 g total) by mouth 2 (two) times daily.     pantoprazole 40 MG tablet  Commonly known as:  PROTONIX  TAKE 1 TABLET DAILY     pravastatin 80 MG tablet  Commonly known as:  PRAVACHOL  TAKE 1 TABLET DAILY     valACYclovir 1000 MG tablet  Commonly known as:  VALTREX  Take 1 tablet (1,000 mg total) by mouth daily. As directed     Vitamin D3 1000 UNITS Caps  Take by mouth 2 (two) times daily.           Follow-up Information    Follow up with [facility], Larisha Vencill, C, MD In 2 weeks.   Specialty:  Neurosurgery   Contact information:   [address] [place] [address] [phone]       Signed: Consuella Lose, Loletha Grayer 05/17/2015, 8:45 AM

## 2015-05-17 NOTE — Evaluation (Signed)
Occupational Therapy Evaluation Patient Details Name: Yolanda [name] MRN: [id number] DOB: 1935-08-27 Today's Date: 05/17/2015    History of Present Illness 80 y.o. female s/p meningioma resection and optic nerve decompression   Clinical Impression   Patient is s/p meningioma resection with optic nerve decompression surgery resulting in functional limitations due to the deficits listed below (see OT problem list). PTA independent with all adls and sudden change in vision Patient will benefit from skilled OT acutely to increase independence and safety with ADLS to allow discharge hhot.     Follow Up Recommendations  Home health OT    Equipment Recommendations  None recommended by OT    Recommendations for Other Services       Precautions / Restrictions Precautions Precautions: Fall Restrictions Weight Bearing Restrictions: No      Mobility Bed Mobility Overal bed mobility: Modified Independent                Transfers Overall transfer level: Needs assistance Equipment used: None Transfers: Sit to/from Stand Sit to Stand: Supervision              Balance Overall balance assessment: Needs assistance         Standing balance support: Single extremity supported;During functional activity Standing balance-Leahy Scale: Fair                              ADL Overall ADL's : Needs assistance/impaired     Grooming: Wash/dry hands;Oral care;Wash/dry face;Modified independent;Standing Grooming Details (indicate cue type and reason): able to open all containers and scan sink surface to locate them Upper Body Bathing: Modified independent;Standing   Lower Body Bathing: Supervison/ safety;Sit to/from stand   Upper Body Dressing : Modified independent       Toilet Transfer: Supervision/safety;Ambulation           Functional mobility during ADLs: Supervision/safety;Rolling walker General ADL Comments: reports discomfort due to inability to  move bowels. pt completed full adl at sink level     Vision Vision Assessment?: Yes Eye Alignment: Within Functional Limits Ocular Range of Motion: Within Functional Limits Additional Comments: L eye with decr smooth pursuits with horizontal eye movement. "frog hopping" pt states "it jumped"   Perception     Praxis      Pertinent Vitals/Pain Pain Assessment: 0-10 Pain Score: 2  Pain Location: abdomen / buttock Pain Intervention(s): Other (comment);Repositioned (rn notified > 4 days without void of stool)     Hand Dominance Right   Extremity/Trunk Assessment Upper Extremity Assessment Upper Extremity Assessment: Overall WFL for tasks assessed   Lower Extremity Assessment Lower Extremity Assessment: Defer to PT evaluation   Cervical / Trunk Assessment Cervical / Trunk Assessment: Normal   Communication Communication Communication: No difficulties   Cognition Arousal/Alertness: Awake/alert Behavior During Therapy: WFL for tasks assessed/performed Overall Cognitive Status: Within Functional Limits for tasks assessed                     General Comments       Exercises       Shoulder Instructions      Home Living Family/patient expects to be discharged to:: Private residence Living Arrangements: Spouse/significant other Available Help at Discharge: Family;Available 24 hours/day Type of Home: House Home Access: Stairs to enter [employer] of Steps: 3 Entrance Stairs-Rails: None Home Layout: One level     Bathroom Shower/Tub: Tub/shower unit;Walk-in shower;Door   [employer]: Standard  Home Equipment: [place] - 2 wheels;Shower seat          Prior Functioning/Environment Level of Independence: Independent             OT Diagnosis: Generalized weakness   OT Problem List:     OT Treatment/Interventions:      OT Goals(Current goals can be found in the care plan section) Acute Rehab OT Goals Patient Stated Goal: to go  home OT Goal Formulation: With patient/family Potential to Achieve Goals: Good  OT Frequency:     Barriers to D/C:            Co-evaluation              End of Session Equipment Utilized During Treatment: Gait belt;Rolling walker Nurse Communication: Mobility status;Precautions  Activity Tolerance: Patient tolerated treatment well Patient left: in chair;with call bell/phone within reach;with chair alarm set   Time: 3013-1438 OT Time Calculation (min): 25 min Charges:  OT General Charges $OT Visit: 1 Procedure OT Evaluation $Initial OT Evaluation Tier I: 1 Procedure OT Treatments $Self Care/Home Management : 8-22 mins G-Codes:    Peri Maris May 29, 2015, 11:00 AM  Jeri Modena   OTR/L Pager: [phone] Office: [phone] .

## 2015-05-17 NOTE — Progress Notes (Signed)
D/C orders received, pt for D/C home today with home health care.  IV and telemetry D/C.  Rx and D/C instructions given with verbalized understanding.  Family at bedside to assist with D/C.  Staff brought pt downstairs via wheelchair.  

## 2015-05-17 NOTE — Progress Notes (Signed)
Physical Therapy Treatment Patient Details Name: Yolanda [name] MRN: [id number] DOB: [birth year] Today's Date: 06/02/15    History of Present Illness 80 y.o. female s/p meningioma resection and optic nerve decompression    PT Comments    Patient has progressed well with mobility. Educated regarding safety for stair negotiation and ambulation without device. Patient and family receptive. Patient safe for d/c home with family.  Follow Up Recommendations  Home health PT;Supervision/Assistance - 24 hour     Equipment Recommendations  None recommended by PT    Recommendations for Other Services       Precautions / Restrictions Precautions Precautions: Fall Restrictions Weight Bearing Restrictions: No    Mobility  Bed Mobility Overal bed mobility: Modified Independent                Transfers Overall transfer level: Needs assistance Equipment used: None   Sit to Stand: Supervision            Ambulation/Gait Ambulation/Gait assistance: Min guard Ambulation Distance (Feet): 640 Feet Assistive device: None   Gait velocity: decreased   General Gait Details: Moderately unstable, requiring min assist with hand held support. VC for awareness. Improved greatly with RW use for support. VC for walker placement for proximity. No loss of balance with this device for support.   Stairs Stairs: Yes Stairs assistance: Min guard Stair Management: One rail Right;Step to pattern;Forwards Number of Stairs: 4 General stair comments: Educated patient and family for 1 person assist  Wheelchair Mobility    Modified Rankin (Stroke Patients Only)       Balance             Standing balance-Leahy Scale: Fair                      Cognition Arousal/Alertness: Awake/alert Behavior During Therapy: WFL for tasks assessed/performed Overall Cognitive Status: Within Functional Limits for tasks assessed                      Exercises      General  Comments        Pertinent Vitals/Pain Pain Assessment: 0-10 Pain Score: 2  Pain Location: buttocks Pain Descriptors / Indicators: Sore Pain Intervention(s): Monitored during session;Repositioned    Home Living                      Prior Function            PT Goals (current goals can now be found in the care plan section) Acute Rehab PT Goals Patient Stated Goal: to go home PT Goal Formulation: With patient Time For Goal Achievement: 05/30/15 Potential to Achieve Goals: Good Progress towards PT goals: Progressing toward goals    Frequency  Min 5X/week    PT Plan Current plan remains appropriate    Co-evaluation             End of Session Equipment Utilized During Treatment: Gait belt Activity Tolerance: Patient tolerated treatment well Patient left: in chair;with call bell/phone within reach;with chair alarm set     Time: 1042-1100 PT Time Calculation (min) (ACUTE ONLY): 18 min  Charges:  $Gait Training: 8-22 mins                    G CodesDuncan Dull 06-02-2015, 2:16 PM Alben Deeds, [place] DPT  [phone]

## 2015-05-25 ENCOUNTER — Ambulatory Visit (INDEPENDENT_AMBULATORY_CARE_PROVIDER_SITE_OTHER): Payer: Medicare Other | Admitting: Family Medicine

## 2015-05-25 ENCOUNTER — Encounter: Payer: Self-pay | Admitting: Family Medicine

## 2015-05-25 VITALS — BP 140/90 | HR 63 | Temp 96.9°F | Ht 62.0 in | Wt 113.0 lb

## 2015-05-25 DIAGNOSIS — E559 Vitamin D deficiency, unspecified: Secondary | ICD-10-CM

## 2015-05-25 DIAGNOSIS — I2510 Atherosclerotic heart disease of native coronary artery without angina pectoris: Secondary | ICD-10-CM | POA: Diagnosis not present

## 2015-05-25 DIAGNOSIS — M810 Age-related osteoporosis without current pathological fracture: Secondary | ICD-10-CM

## 2015-05-25 DIAGNOSIS — E785 Hyperlipidemia, unspecified: Secondary | ICD-10-CM

## 2015-05-25 DIAGNOSIS — D329 Benign neoplasm of meninges, unspecified: Secondary | ICD-10-CM

## 2015-05-25 DIAGNOSIS — K219 Gastro-esophageal reflux disease without esophagitis: Secondary | ICD-10-CM | POA: Diagnosis not present

## 2015-05-25 DIAGNOSIS — I10 Essential (primary) hypertension: Secondary | ICD-10-CM

## 2015-05-25 MED ORDER — HYDROCODONE-ACETAMINOPHEN 5-325 MG PO TABS: 1.0000 | ORAL_TABLET | Freq: Every evening | ORAL | Status: DC | PRN

## 2015-05-25 NOTE — Patient Instructions (Addendum)
Medicare Annual Wellness Visit  [facility] and the medical providers at [facility] strive to bring you the best medical care.  In doing so we not only want to address your current medical conditions and concerns but also to detect new conditions early and prevent illness, disease and health-related problems.    Medicare offers a yearly Wellness Visit which allows our clinical staff to assess your need for preventative services including immunizations, lifestyle education, counseling to decrease risk of preventable diseases and screening for fall risk and other medical concerns.    This visit is provided free of charge (no copay) for all Medicare recipients. The clinical pharmacists at [facility] have begun to conduct these Wellness Visits which will also include a thorough review of all your medications.    As you primary medical provider recommend that you make an appointment for your Annual Wellness Visit if you have not done so already this year.  You may set up this appointment before you leave today or you may call back ([phone]) and schedule an appointment.  Please make sure when you call that you mention that you are scheduling your Annual Wellness Visit with the clinical pharmacist so that the appointment may be made for the proper length of time.     Continue current medications. Continue good therapeutic lifestyle changes which include good diet and exercise. Fall precautions discussed with patient. If an FOBT was given today- please return it to our front desk. If you are over 6 years old - you may need Prevnar 33 or the adult Pneumonia vaccine.  **Flu shots will be available soon--- please call and schedule a FLU-CLINIC appointment**  After your visit with [place] today you will receive a survey in the mail or online from [employer] regarding your care with [place]. Please take a moment to fill this out. Your feedback is  very important to [place] as you can help [place] better understand your patient needs as well as improve your experience and satisfaction. WE CARE ABOUT YOU!!!   **Please join [place] SEPT.22, 2016 from 5:00 to 7:00pm for our OPEN HOUSE! Come out and meet our NEW providers** The patient is reminded to come and get her mammogram and we'll schedule this as she is leaving today. She should follow-up regularly with her surgeon as planned and should resume her activity level pending his recommendations She should return the FOBT in January She will be given an appointment for mammogram as she leaves today. We will discuss the resumption of her poorly with the clinical pharmacists that we will get to call her. We will refill her pain medication 1 and she will take an extra strength Tylenol 3 or 4 times daily if needed for additional pain She will continue to monitor blood pressure readings at home

## 2015-05-25 NOTE — Progress Notes (Signed)
Subjective:    Patient ID: Yolanda [name], female    DOB: 12/24/1934, 80 y.o.   MRN: [id number]  HPI Pt here for follow up and management of chronic medical problems which includes hypertension and hyperlipidemia. She is taking medications regularly. The patient recently had surgery for a meningioma because of losing vision in her eye. She is having trouble taking her pain medicine because it makes her feel so bad and she is only taking this at bedtime.        Patient Active Problem List   Diagnosis Date Noted  . Meningioma 05/13/2015  . Benign neoplasm of descending colon 10/22/2014  . Osteoporosis, postmenopausal 10/16/2013  . Allergic rhinitis 08/20/2013  . Vitamin D deficiency 08/20/2013  . GERD (gastroesophageal reflux disease) 08/20/2013  . THYROID NODULE 01/11/2009  . HYPERLIPIDEMIA-MIXED 01/11/2009  . ANEMIA 01/11/2009  . Essential hypertension 01/11/2009  . Coronary atherosclerosis 01/11/2009  . CAROTID STENOSIS 01/11/2009   Outpatient Encounter Prescriptions as of 05/25/2015  Medication Sig  . acyclovir ointment (ZOVIRAX) 5 % Apply 1 application topically every 3 (three) hours. Apply frequently and sparingly to cold sores as needed.  Marland Kitchen aspirin EC 81 MG tablet Take 81 mg by mouth daily.  Marland Kitchen azelastine (ASTELIN) 0.1 % nasal spray Place 1 spray into both nostrils 2 (two) times daily. Use in each nostril as directed (Patient taking differently: Place 1 spray into both nostrils 2 (two) times daily as needed for rhinitis or allergies. Use in each nostril as directed)  . calcium carbonate 200 MG capsule Take 1 capsule (200 mg total) by mouth daily.  . Cholecalciferol (VITAMIN D3) 1000 UNITS CAPS Take by mouth 2 (two) times daily.    . CO ENZYME Q-10 PO Take 1 capsule by mouth daily.   . colesevelam (WELCHOL) 625 MG tablet Take 3 tablets (1,875 mg total) by mouth 2 (two) times daily with a meal. Take one tablet by mouth three times a day  . fluticasone (FLONASE) 50 MCG/ACT nasal  spray 1-2 sprays each nostril daily as directed for allergic rhinitis  . hydrochlorothiazide (MICROZIDE) 12.5 MG capsule Take 1 capsule (12.5 mg total) by mouth daily.  Marland Kitchen HYDROcodone-acetaminophen (NORCO/VICODIN) 5-325 MG per tablet Take 1 tablet by mouth every 4 (four) hours as needed for moderate pain.  . IRON PO Take 1 tablet by mouth every other day.   . levETIRAcetam (KEPPRA) 500 MG tablet Take 1 tablet (500 mg total) by mouth 2 (two) times daily.  . Multiple Vitamin (MULTI-VITAMIN PO) Take 1 tablet by mouth daily.   Marland Kitchen omega-3 acid ethyl esters (LOVAZA) 1 G capsule Take 2 capsules (2 g total) by mouth 2 (two) times daily.  . pantoprazole (PROTONIX) 40 MG tablet TAKE 1 TABLET DAILY  . pravastatin (PRAVACHOL) 80 MG tablet TAKE 1 TABLET DAILY  . valACYclovir (VALTREX) 1000 MG tablet Take 1 tablet (1,000 mg total) by mouth daily. As directed (Patient taking differently: Take 1,000 mg by mouth daily as needed. As directed)  . [DISCONTINUED] methylPREDNISolone (MEDROL DOSEPAK) 4 MG TBPK tablet Take tapering dose as instructed on package  . denosumab (PROLIA) 60 MG/ML SOLN injection Inject 60 mg into the skin every 6 (six) months. Administer in upper arm, thigh, or abdomen   No facility-administered encounter medications on file as of 05/25/2015.     Review of Systems  Constitutional: Negative.   HENT: Negative.   Eyes: Negative.   Respiratory: Negative.   Cardiovascular: Negative.   Gastrointestinal: Negative.   Endocrine: Negative.  Genitourinary: Negative.   Musculoskeletal: Positive for arthralgias.  Skin: Negative.   Allergic/Immunologic: Negative.   Neurological: Negative.   Hematological: Negative.   Psychiatric/Behavioral: Negative.        Objective:   Physical Exam  Constitutional: She is oriented to person, place, and time. She appears well-developed and well-nourished. No distress.  HENT:  Head: Normocephalic.  Right Ear: External ear normal.  Left Ear: External ear  normal.  Nose: Nose normal.  Mouth/Throat: Oropharynx is clear and moist.  The patient has recently had brain surgery for a meningioma affecting vision in her left eye. She is recovering well from this and seems to be in good spirits today and the incision appears to be healing well on her upper fore head.  Eyes: Conjunctivae and EOM are normal. Pupils are equal, round, and reactive to light. Right eye exhibits no discharge. Left eye exhibits no discharge. No scleral icterus.  Vision from her left eye is blurred on one-to-one confrontation but says it is better than before the surgery and immediately after the surgery it was much much clear and most likely due to local swelling is somewhat more impaired but she was reassured about this and will most likely get better.  Neck: Normal range of motion. Neck supple. No thyromegaly present.  Cardiovascular: Normal rate, regular rhythm, normal heart sounds and intact distal pulses.   No murmur heard. At 60/m  Pulmonary/Chest: Effort normal and breath sounds normal. No respiratory distress. She has no wheezes. She has no rales. She exhibits no tenderness.  Abdominal: Soft. Bowel sounds are normal. She exhibits no mass. There is no tenderness. There is no rebound and no guarding.  No abdominal tenderness or bruits  Musculoskeletal: Normal range of motion. She exhibits no edema or tenderness.  Lymphadenopathy:    She has no cervical adenopathy.  Neurological: She is alert and oriented to person, place, and time. She has normal reflexes. No cranial nerve deficit.  Skin: Skin is warm and dry. No rash noted.  Psychiatric: She has a normal mood and affect. Her behavior is normal. Judgment and thought content normal.  Nursing note and vitals reviewed.  BP 172/86 mmHg  Pulse 63  Temp(Src) 96.9 F (36.1 C) (Oral)  Ht '5\' 2"'  (1.575 m)  Wt 113 lb (51.256 kg)  BMI 20.66 kg/m2        Assessment & Plan:  1. Essential hypertension The blood pressure was  slightly elevated in the office today but upon recheck the reading was 140/90. She says her readings have been in the 130s at home and even better than here in the office and she will continue to monitor these. - BMP8+EGFR - CBC with Differential/Platelet - Hepatic function panel  2. Vitamin D deficiency -Continue current treatment pending results of lab work - CBC with Differential/Platelet - Vit D  25 hydroxy (rtn osteoporosis monitoring)  3. Gastroesophageal reflux disease, esophagitis presence not specified -No complaints today with reflux - CBC with Differential/Platelet - Hepatic function panel  4. Hyperlipemia -Continue pravastatin and resume activity as recommended by surgeon - CBC with Differential/Platelet - NMR, lipoprofile  5. Meningioma -Follow-up with neurosurgeon as planned - CBC with Differential/Platelet  6. Osteoporosis, postmenopausal -Prolia resumption was discussed with the clinical pharmacists as she has missed a couple of shots of this. We will have the clinical pharmacist review her record and call about when to resume this medication for her osteoporosis  Meds ordered this encounter  Medications  . HYDROcodone-acetaminophen (NORCO/VICODIN) 5-325 MG  per tablet    Sig: Take 1 tablet by mouth at bedtime as needed for moderate pain.    Dispense:  30 tablet    Refill:  0   Patient Instructions                       Medicare Annual Wellness Visit  [facility] and the medical providers at [facility] strive to bring you the best medical care.  In doing so we not only want to address your current medical conditions and concerns but also to detect new conditions early and prevent illness, disease and health-related problems.    Medicare offers a yearly Wellness Visit which allows our clinical staff to assess your need for preventative services including immunizations, lifestyle education, counseling to decrease risk of preventable diseases  and screening for fall risk and other medical concerns.    This visit is provided free of charge (no copay) for all Medicare recipients. The clinical pharmacists at [facility] have begun to conduct these Wellness Visits which will also include a thorough review of all your medications.    As you primary medical provider recommend that you make an appointment for your Annual Wellness Visit if you have not done so already this year.  You may set up this appointment before you leave today or you may call back ([phone]) and schedule an appointment.  Please make sure when you call that you mention that you are scheduling your Annual Wellness Visit with the clinical pharmacist so that the appointment may be made for the proper length of time.     Continue current medications. Continue good therapeutic lifestyle changes which include good diet and exercise. Fall precautions discussed with patient. If an FOBT was given today- please return it to our front desk. If you are over 68 years old - you may need Prevnar 38 or the adult Pneumonia vaccine.  **Flu shots will be available soon--- please call and schedule a FLU-CLINIC appointment**  After your visit with [place] today you will receive a survey in the mail or online from [employer] regarding your care with [place]. Please take a moment to fill this out. Your feedback is very important to [place] as you can help [place] better understand your patient needs as well as improve your experience and satisfaction. WE CARE ABOUT YOU!!!   **Please join [place] SEPT.22, 2016 from 5:00 to 7:00pm for our OPEN HOUSE! Come out and meet our NEW providers** The patient is reminded to come and get her mammogram and we'll schedule this as she is leaving today. She should follow-up regularly with her surgeon as planned and should resume her activity level pending his recommendations She should return the FOBT in January She will be given an appointment for mammogram  as she leaves today. We will discuss the resumption of her poorly with the clinical pharmacists that we will get to call her. We will refill her pain medication 1 and she will take an extra strength Tylenol 3 or 4 times daily if needed for additional pain She will continue to monitor blood pressure readings at home   Arrie Senate MD

## 2015-05-26 LAB — CBC WITH DIFFERENTIAL/PLATELET
BASOS: 0 %
Basophils Absolute: 0 10*3/uL (ref 0.0–0.2)
EOS (ABSOLUTE): 0.1 10*3/uL (ref 0.0–0.4)
EOS: 1 %
HEMATOCRIT: 42.7 % (ref 34.0–46.6)
Hemoglobin: 14.1 g/dL (ref 11.1–15.9)
IMMATURE GRANULOCYTES: 2 %
Immature Grans (Abs): 0.2 10*3/uL — ABNORMAL HIGH (ref 0.0–0.1)
LYMPHS: 22 %
Lymphocytes Absolute: 2.2 10*3/uL (ref 0.7–3.1)
MCH: 32.4 pg (ref 26.6–33.0)
MCHC: 33 g/dL (ref 31.5–35.7)
MCV: 98 fL — AB (ref 79–97)
Monocytes Absolute: 1.3 10*3/uL — ABNORMAL HIGH (ref 0.1–0.9)
Monocytes: 13 %
NEUTROS PCT: 62 %
Neutrophils Absolute: 6.3 10*3/uL (ref 1.4–7.0)
PLATELETS: 249 10*3/uL (ref 150–379)
RBC: 4.35 x10E6/uL (ref 3.77–5.28)
RDW: 14.4 % (ref 12.3–15.4)
WBC: 10 10*3/uL (ref 3.4–10.8)

## 2015-05-26 LAB — HEPATIC FUNCTION PANEL
ALT: 19 IU/L (ref 0–32)
AST: 23 IU/L (ref 0–40)
Albumin: 4 g/dL (ref 3.5–4.8)
Alkaline Phosphatase: 53 IU/L (ref 39–117)
Bilirubin Total: 0.6 mg/dL (ref 0.0–1.2)
Bilirubin, Direct: 0.16 mg/dL (ref 0.00–0.40)
TOTAL PROTEIN: 6.1 g/dL (ref 6.0–8.5)

## 2015-05-26 LAB — BMP8+EGFR
BUN / CREAT RATIO: 13 (ref 11–26)
BUN: 13 mg/dL (ref 8–27)
CHLORIDE: 98 mmol/L (ref 97–108)
CO2: 25 mmol/L (ref 18–29)
Calcium: 9.7 mg/dL (ref 8.7–10.3)
Creatinine, Ser: 1.03 mg/dL — ABNORMAL HIGH (ref 0.57–1.00)
GFR calc Af Amer: 60 mL/min/{1.73_m2} (ref >59)
GFR calc non Af Amer: 52 mL/min/{1.73_m2} — ABNORMAL LOW (ref >59)
GLUCOSE: 84 mg/dL (ref 65–99)
Potassium: 4 mmol/L (ref 3.5–5.2)
SODIUM: 141 mmol/L (ref 134–144)

## 2015-05-26 LAB — VITAMIN D 25 HYDROXY (VIT D DEFICIENCY, FRACTURES): Vit D, 25-Hydroxy: 38.1 ng/mL (ref 30.0–100.0)

## 2015-05-26 LAB — NMR, LIPOPROFILE
CHOLESTEROL: 211 mg/dL — AB (ref 100–199)
HDL Cholesterol by NMR: 81 mg/dL (ref >39)
HDL Particle Number: 40.5 umol/L (ref >=30.5)
LDL PARTICLE NUMBER: 1091 nmol/L — AB (ref <1000)
LDL Size: 21.2 nm (ref >20.5)
LDL-C: 104 mg/dL — AB (ref 0–99)
LP-IR Score: <25 (ref <=45)
Small LDL Particle Number: 301 nmol/L (ref <=527)
TRIGLYCERIDES BY NMR: 128 mg/dL (ref 0–149)

## 2015-05-31 ENCOUNTER — Encounter (HOSPITAL_COMMUNITY): Payer: Self-pay

## 2015-05-31 DIAGNOSIS — Z1231 Encounter for screening mammogram for malignant neoplasm of breast: Secondary | ICD-10-CM | POA: Diagnosis not present

## 2015-06-14 ENCOUNTER — Encounter: Payer: Self-pay | Admitting: Family Medicine

## 2015-06-15 DIAGNOSIS — H1851 Endothelial corneal dystrophy: Secondary | ICD-10-CM | POA: Diagnosis not present

## 2015-06-15 DIAGNOSIS — H04123 Dry eye syndrome of bilateral lacrimal glands: Secondary | ICD-10-CM | POA: Diagnosis not present

## 2015-06-15 DIAGNOSIS — Z961 Presence of intraocular lens: Secondary | ICD-10-CM | POA: Diagnosis not present

## 2015-06-15 DIAGNOSIS — H469 Unspecified optic neuritis: Secondary | ICD-10-CM | POA: Diagnosis not present

## 2015-06-24 ENCOUNTER — Telehealth: Payer: Self-pay | Admitting: Cardiology

## 2015-06-24 NOTE — Telephone Encounter (Signed)
Patient stated that she was told by Dr Percival Spanish to wait another year before having her Carotid Duplex done. Out patient Vascular Report suggested 1 yr fu from June 2015

## 2015-07-16 ENCOUNTER — Telehealth: Payer: Self-pay | Admitting: Family Medicine

## 2015-07-16 MED ORDER — PANTOPRAZOLE SODIUM 40 MG PO TBEC: DELAYED_RELEASE_TABLET | ORAL | Status: DC

## 2015-07-16 MED ORDER — PRAVASTATIN SODIUM 80 MG PO TABS: 80.0000 mg | ORAL_TABLET | Freq: Every day | ORAL | Status: DC

## 2015-07-16 MED ORDER — FLUTICASONE PROPIONATE 50 MCG/ACT NA SUSP: NASAL | Status: DC

## 2015-07-16 MED ORDER — HYDROCHLOROTHIAZIDE 12.5 MG PO CAPS: 12.5000 mg | ORAL_CAPSULE | Freq: Every day | ORAL | Status: DC

## 2015-07-16 NOTE — Telephone Encounter (Signed)
done

## 2015-08-16 DIAGNOSIS — D329 Benign neoplasm of meninges, unspecified: Secondary | ICD-10-CM | POA: Diagnosis not present

## 2015-08-19 DIAGNOSIS — L03019 Cellulitis of unspecified finger: Secondary | ICD-10-CM | POA: Diagnosis not present

## 2015-08-19 DIAGNOSIS — D225 Melanocytic nevi of trunk: Secondary | ICD-10-CM | POA: Diagnosis not present

## 2015-08-19 DIAGNOSIS — L579 Skin changes due to chronic exposure to nonionizing radiation, unspecified: Secondary | ICD-10-CM | POA: Diagnosis not present

## 2015-08-25 DIAGNOSIS — D329 Benign neoplasm of meninges, unspecified: Secondary | ICD-10-CM | POA: Diagnosis not present

## 2015-08-30 DIAGNOSIS — I10 Essential (primary) hypertension: Secondary | ICD-10-CM | POA: Diagnosis not present

## 2015-08-30 DIAGNOSIS — D329 Benign neoplasm of meninges, unspecified: Secondary | ICD-10-CM | POA: Diagnosis not present

## 2015-09-15 DIAGNOSIS — H1851 Endothelial corneal dystrophy: Secondary | ICD-10-CM | POA: Diagnosis not present

## 2015-09-15 DIAGNOSIS — Z961 Presence of intraocular lens: Secondary | ICD-10-CM | POA: Diagnosis not present

## 2015-09-15 DIAGNOSIS — D3132 Benign neoplasm of left choroid: Secondary | ICD-10-CM | POA: Diagnosis not present

## 2015-09-15 DIAGNOSIS — H469 Unspecified optic neuritis: Secondary | ICD-10-CM | POA: Diagnosis not present

## 2015-09-15 DIAGNOSIS — H04123 Dry eye syndrome of bilateral lacrimal glands: Secondary | ICD-10-CM | POA: Diagnosis not present

## 2015-10-14 ENCOUNTER — Ambulatory Visit: Payer: Medicare Other | Admitting: Family Medicine

## 2015-10-25 ENCOUNTER — Other Ambulatory Visit: Payer: Medicare Other

## 2015-10-25 DIAGNOSIS — E785 Hyperlipidemia, unspecified: Secondary | ICD-10-CM | POA: Diagnosis not present

## 2015-10-25 DIAGNOSIS — E559 Vitamin D deficiency, unspecified: Secondary | ICD-10-CM | POA: Diagnosis not present

## 2015-10-25 DIAGNOSIS — K219 Gastro-esophageal reflux disease without esophagitis: Secondary | ICD-10-CM

## 2015-10-25 DIAGNOSIS — I10 Essential (primary) hypertension: Secondary | ICD-10-CM | POA: Diagnosis not present

## 2015-10-26 LAB — NMR, LIPOPROFILE
Cholesterol: 188 mg/dL (ref 100–199)
HDL Cholesterol by NMR: 91 mg/dL (ref >39)
HDL Particle Number: 47.6 umol/L (ref >=30.5)
LDL Particle Number: 813 nmol/L (ref <1000)
LDL SIZE: 20.4 nm (ref >20.5)
LDL-C: 84 mg/dL (ref 0–99)
LP-IR Score: 33 (ref <=45)
SMALL LDL PARTICLE NUMBER: 375 nmol/L (ref <=527)
TRIGLYCERIDES BY NMR: 67 mg/dL (ref 0–149)

## 2015-10-26 LAB — BMP8+EGFR
BUN / CREAT RATIO: 19 (ref 11–26)
BUN: 18 mg/dL (ref 8–27)
CO2: 28 mmol/L (ref 18–29)
CREATININE: 0.95 mg/dL (ref 0.57–1.00)
Calcium: 9.4 mg/dL (ref 8.7–10.3)
Chloride: 101 mmol/L (ref 96–106)
GFR calc non Af Amer: 58 mL/min/{1.73_m2} — ABNORMAL LOW (ref >59)
GFR, EST AFRICAN AMERICAN: 66 mL/min/{1.73_m2} (ref >59)
Glucose: 83 mg/dL (ref 65–99)
Potassium: 4.1 mmol/L (ref 3.5–5.2)
Sodium: 143 mmol/L (ref 134–144)

## 2015-10-26 LAB — CBC WITH DIFFERENTIAL/PLATELET
Basophils Absolute: 0 10*3/uL (ref 0.0–0.2)
Basos: 0 %
EOS (ABSOLUTE): 0.4 10*3/uL (ref 0.0–0.4)
EOS: 6 %
HEMATOCRIT: 42 % (ref 34.0–46.6)
HEMOGLOBIN: 14.2 g/dL (ref 11.1–15.9)
IMMATURE GRANS (ABS): 0 10*3/uL (ref 0.0–0.1)
Immature Granulocytes: 0 %
LYMPHS ABS: 2.6 10*3/uL (ref 0.7–3.1)
LYMPHS: 38 %
MCH: 31.3 pg (ref 26.6–33.0)
MCHC: 33.8 g/dL (ref 31.5–35.7)
MCV: 93 fL (ref 79–97)
MONOCYTES: 10 %
Monocytes Absolute: 0.7 10*3/uL (ref 0.1–0.9)
Neutrophils Absolute: 3.1 10*3/uL (ref 1.4–7.0)
Neutrophils: 46 %
PLATELETS: 195 10*3/uL (ref 150–379)
RBC: 4.54 x10E6/uL (ref 3.77–5.28)
RDW: 13.6 % (ref 12.3–15.4)
WBC: 6.8 10*3/uL (ref 3.4–10.8)

## 2015-10-26 LAB — HEPATIC FUNCTION PANEL
ALK PHOS: 53 IU/L (ref 39–117)
ALT: 14 IU/L (ref 0–32)
AST: 23 IU/L (ref 0–40)
Albumin: 4.3 g/dL (ref 3.5–4.8)
BILIRUBIN TOTAL: 0.3 mg/dL (ref 0.0–1.2)
BILIRUBIN, DIRECT: 0.11 mg/dL (ref 0.00–0.40)
Total Protein: 6.6 g/dL (ref 6.0–8.5)

## 2015-10-26 LAB — VITAMIN D 25 HYDROXY (VIT D DEFICIENCY, FRACTURES): Vit D, 25-Hydroxy: 50 ng/mL (ref 30.0–100.0)

## 2015-11-01 ENCOUNTER — Ambulatory Visit (INDEPENDENT_AMBULATORY_CARE_PROVIDER_SITE_OTHER): Payer: Medicare Other | Admitting: Family Medicine

## 2015-11-01 ENCOUNTER — Encounter: Payer: Self-pay | Admitting: Family Medicine

## 2015-11-01 VITALS — BP 129/84 | HR 74 | Temp 97.7°F | Ht 62.0 in | Wt 119.0 lb

## 2015-11-01 DIAGNOSIS — E785 Hyperlipidemia, unspecified: Secondary | ICD-10-CM | POA: Diagnosis not present

## 2015-11-01 DIAGNOSIS — I2510 Atherosclerotic heart disease of native coronary artery without angina pectoris: Secondary | ICD-10-CM | POA: Diagnosis not present

## 2015-11-01 DIAGNOSIS — K219 Gastro-esophageal reflux disease without esophagitis: Secondary | ICD-10-CM | POA: Diagnosis not present

## 2015-11-01 DIAGNOSIS — M810 Age-related osteoporosis without current pathological fracture: Secondary | ICD-10-CM | POA: Diagnosis not present

## 2015-11-01 DIAGNOSIS — I10 Essential (primary) hypertension: Secondary | ICD-10-CM

## 2015-11-01 DIAGNOSIS — E559 Vitamin D deficiency, unspecified: Secondary | ICD-10-CM | POA: Diagnosis not present

## 2015-11-01 DIAGNOSIS — D329 Benign neoplasm of meninges, unspecified: Secondary | ICD-10-CM | POA: Diagnosis not present

## 2015-11-01 MED ORDER — COLESEVELAM HCL 625 MG PO TABS: 1875.0000 mg | ORAL_TABLET | Freq: Two times a day (BID) | ORAL | Status: DC

## 2015-11-01 MED ORDER — ACYCLOVIR 5 % EX OINT: 1.0000 "application " | TOPICAL_OINTMENT | CUTANEOUS | Status: DC

## 2015-11-01 NOTE — Progress Notes (Signed)
Subjective:    Patient ID: Yolanda [name], female    DOB: 03/23/1935, 81 y.o.   MRN: [id number]  HPI Pt here for follow up and management of chronic medical problems which includes hypertension. She is taking medications regularly. The patient is doing well today with minimal complaints. She appears to have recovered well from resection of the meningioma. She continues to be followed by the cardiologist. Recent lab work with the patient was reviewed with her today and all of her numbers were improved in excellent and this includes liver kidney lipids and CBC. The patient is doing great has a positive attitude following removal of the brain tumor that was benign. She does have plans to have her CT scan repeated in May. She questions about prolia. She denied any chest pain shortness of breath trouble swallowing heartburn indigestion nausea vomiting diarrhea or blood in the stool. She is passing her water without problems. She is back to her usual activity following the brain surgery and her vision is much improved also according to her ophthalmologist.     Patient Active Problem List   Diagnosis Date Noted  . Meningioma ([place]) 05/13/2015  . Benign neoplasm of descending colon 10/22/2014  . Osteoporosis, postmenopausal 10/16/2013  . Allergic rhinitis 08/20/2013  . Vitamin D deficiency 08/20/2013  . GERD (gastroesophageal reflux disease) 08/20/2013  . THYROID NODULE 01/11/2009  . HYPERLIPIDEMIA-MIXED 01/11/2009  . ANEMIA 01/11/2009  . Essential hypertension 01/11/2009  . Coronary atherosclerosis 01/11/2009  . CAROTID STENOSIS 01/11/2009   Outpatient Encounter Prescriptions as of 11/01/2015  Medication Sig  . acyclovir ointment (ZOVIRAX) 5 % Apply 1 application topically every 3 (three) hours. Apply frequently and sparingly to cold sores as needed.  Marland Kitchen aspirin EC 81 MG tablet Take 81 mg by mouth daily.  . calcium carbonate 200 MG capsule Take 1 capsule (200 mg total) by mouth daily.  .  Cholecalciferol (VITAMIN D3) 1000 UNITS CAPS Take by mouth 2 (two) times daily.    . CO ENZYME Q-10 PO Take 1 capsule by mouth daily.   . colesevelam (WELCHOL) 625 MG tablet Take 3 tablets (1,875 mg total) by mouth 2 (two) times daily with a meal. Take one tablet by mouth three times a day  . denosumab (PROLIA) 60 MG/ML SOLN injection Inject 60 mg into the skin every 6 (six) months. Administer in upper arm, thigh, or abdomen  . fluticasone (FLONASE) 50 MCG/ACT nasal spray 1-2 sprays each nostril daily as directed for allergic rhinitis  . hydrochlorothiazide (MICROZIDE) 12.5 MG capsule Take 1 capsule (12.5 mg total) by mouth daily.  . IRON PO Take 1 tablet by mouth every other day.   . Multiple Vitamin (MULTI-VITAMIN PO) Take 1 tablet by mouth daily.   Marland Kitchen omega-3 acid ethyl esters (LOVAZA) 1 G capsule Take 2 capsules (2 g total) by mouth 2 (two) times daily.  . pantoprazole (PROTONIX) 40 MG tablet TAKE 1 TABLET DAILY  . pravastatin (PRAVACHOL) 80 MG tablet Take 1 tablet (80 mg total) by mouth daily.  . valACYclovir (VALTREX) 1000 MG tablet Take 1 tablet (1,000 mg total) by mouth daily. As directed (Patient taking differently: Take 1,000 mg by mouth daily as needed. As directed)  . [DISCONTINUED] azelastine (ASTELIN) 0.1 % nasal spray Place 1 spray into both nostrils 2 (two) times daily. Use in each nostril as directed (Patient taking differently: Place 1 spray into both nostrils 2 (two) times daily as needed for rhinitis or allergies. Use in each nostril as directed)  . [  DISCONTINUED] HYDROcodone-acetaminophen (NORCO/VICODIN) 5-325 MG per tablet Take 1 tablet by mouth at bedtime as needed for moderate pain.  . [DISCONTINUED] levETIRAcetam (KEPPRA) 500 MG tablet Take 1 tablet (500 mg total) by mouth 2 (two) times daily.   No facility-administered encounter medications on file as of 11/01/2015.       Review of Systems  Constitutional: Negative.   HENT: Negative.   Eyes: Negative.   Respiratory:  Negative.   Cardiovascular: Negative.   Gastrointestinal: Negative.   Endocrine: Negative.   Genitourinary: Negative.   Musculoskeletal: Negative.   Skin: Negative.   Allergic/Immunologic: Negative.   Neurological: Negative.   Hematological: Negative.   Psychiatric/Behavioral: Negative.        Objective:   Physical Exam  Constitutional: She is oriented to person, place, and time. She appears well-developed and well-nourished. No distress.  HENT:  Head: Normocephalic and atraumatic.  Right Ear: External ear normal.  Left Ear: External ear normal.  Nose: Nose normal.  Mouth/Throat: Oropharynx is clear and moist.  Eyes: Conjunctivae and EOM are normal. Pupils are equal, round, and reactive to light. Right eye exhibits no discharge. Left eye exhibits no discharge. No scleral icterus.  Neck: Normal range of motion. Neck supple. No JVD present. No thyromegaly present.  Cardiovascular: Normal rate, regular rhythm, normal heart sounds and intact distal pulses.   No murmur heard. The heart is regular at 72/m  Pulmonary/Chest: Effort normal and breath sounds normal. No respiratory distress. She has no wheezes. She has no rales. She exhibits no tenderness.  Clear anteriorly and posteriorly  Abdominal: Soft. Bowel sounds are normal. She exhibits no mass. There is no tenderness. There is no rebound and no guarding.  No abdominal tenderness bruits or organ enlargement  Musculoskeletal: Normal range of motion. She exhibits no edema or tenderness.  Lymphadenopathy:    She has no cervical adenopathy.  Neurological: She is alert and oriented to person, place, and time. She has normal reflexes. No cranial nerve deficit.  Skin: Skin is warm and dry. No rash noted.  Psychiatric: She has a normal mood and affect. Her behavior is normal. Judgment and thought content normal.  Nursing note and vitals reviewed.   BP 129/84 mmHg  Pulse 74  Temp(Src) 97.7 F (36.5 C) (Oral)  Ht 5\' 2"  (1.575 m)  Wt  119 lb (53.978 kg)  BMI 21.76 kg/m2       Assessment & Plan:  1. Essential hypertension -The blood pressure is good today the patient will continue with current treatment  2. Vitamin D deficiency -Vitamin D level is good and she will continue with current treatment  3. Gastroesophageal reflux disease, esophagitis presence not specified -She only has occasional heartburn or indigestion and she will continue with proton next.  4. Hyperlipemia -All cholesterol numbers were back to goal and she will continue with pravastatin  5. Meningioma [facility]) -She indicates that this was not a meningioma but some other type of brain tumor that was not malignant.  6. Osteoporosis, postmenopausal -The clinical pharmacists we'll call her regarding treatment of her osteoporosis  7. Atherosclerosis of native coronary artery of native heart without angina pectoris -Continue follow-up with cardiology and watching diet closely and taking cholesterol medication  Meds ordered this encounter  Medications  . acyclovir ointment (ZOVIRAX) 5 %    Sig: Apply 1 application topically every 3 (three) hours. Apply frequently and sparingly to cold sores as needed.    Dispense:  90 g    Refill:  3  .  colesevelam (WELCHOL) 625 MG tablet    Sig: Take 3 tablets (1,875 mg total) by mouth 2 (two) times daily with a meal. Take one tablet by mouth three times a day    Dispense:  540 tablet    Refill:  3   Patient Instructions                       Medicare Annual Wellness Visit  [facility] and the medical providers at [facility] strive to bring you the best medical care.  In doing so we not only want to address your current medical conditions and concerns but also to detect new conditions early and prevent illness, disease and health-related problems.    Medicare offers a yearly Wellness Visit which allows our clinical staff to assess your need for preventative services including immunizations,  lifestyle education, counseling to decrease risk of preventable diseases and screening for fall risk and other medical concerns.    This visit is provided free of charge (no copay) for all Medicare recipients. The clinical pharmacists at [facility] have begun to conduct these Wellness Visits which will also include a thorough review of all your medications.    As you primary medical provider recommend that you make an appointment for your Annual Wellness Visit if you have not done so already this year.  You may set up this appointment before you leave today or you may call back [phone] and schedule an appointment.  Please make sure when you call that you mention that you are scheduling your Annual Wellness Visit with the clinical pharmacist so that the appointment may be made for the proper length of time.     Continue current medications. Continue good therapeutic lifestyle changes which include good diet and exercise. Fall precautions discussed with patient. If an FOBT was given today- please return it to our front desk. If you are over 15 years old - you may need Prevnar 46 or the adult Pneumonia vaccine.  **Flu shots are available--- please call and schedule a FLU-CLINIC appointment**  After your visit with [place] today you will receive a survey in the mail or online from [employer] regarding your care with [place]. Please take a moment to fill this out. Your feedback is very important to [place] as you can help [place] better understand your patient needs as well as improve your experience and satisfaction. WE CARE ABOUT YOU!!!   We will discuss your Prolia with the clinical pharmacist  Continue with current treatment Stay active and did not put yourself at risk for falling Keep the house as cool as possible use cool mist humidification Use nasal saline for nasal congestion   Arrie Senate MD

## 2015-11-01 NOTE — Patient Instructions (Addendum)
Medicare Annual Wellness Visit  [facility] and the medical providers at [facility] strive to bring you the best medical care.  In doing so we not only want to address your current medical conditions and concerns but also to detect new conditions early and prevent illness, disease and health-related problems.    Medicare offers a yearly Wellness Visit which allows our clinical staff to assess your need for preventative services including immunizations, lifestyle education, counseling to decrease risk of preventable diseases and screening for fall risk and other medical concerns.    This visit is provided free of charge (no copay) for all Medicare recipients. The clinical pharmacists at [facility] have begun to conduct these Wellness Visits which will also include a thorough review of all your medications.    As you primary medical provider recommend that you make an appointment for your Annual Wellness Visit if you have not done so already this year.  You may set up this appointment before you leave today or you may call back [phone] and schedule an appointment.  Please make sure when you call that you mention that you are scheduling your Annual Wellness Visit with the clinical pharmacist so that the appointment may be made for the proper length of time.     Continue current medications. Continue good therapeutic lifestyle changes which include good diet and exercise. Fall precautions discussed with patient. If an FOBT was given today- please return it to our front desk. If you are over 36 years old - you may need Prevnar 65 or the adult Pneumonia vaccine.  **Flu shots are available--- please call and schedule a FLU-CLINIC appointment**  After your visit with [place] today you will receive a survey in the mail or online from [employer] regarding your care with [place]. Please take a moment to fill this out. Your feedback is very  important to [place] as you can help [place] better understand your patient needs as well as improve your experience and satisfaction. WE CARE ABOUT YOU!!!   We will discuss your Prolia with the clinical pharmacist  Continue with current treatment Stay active and did not put yourself at risk for falling Keep the house as cool as possible use cool mist humidification Use nasal saline for nasal congestion

## 2015-11-03 ENCOUNTER — Other Ambulatory Visit: Payer: Medicare Other

## 2015-11-03 ENCOUNTER — Encounter: Payer: Self-pay | Admitting: *Deleted

## 2015-11-03 DIAGNOSIS — Z1212 Encounter for screening for malignant neoplasm of rectum: Secondary | ICD-10-CM | POA: Diagnosis not present

## 2015-11-03 NOTE — Progress Notes (Signed)
Patient ID: Yolanda [name], female   DOB: 03-25-1935, 81 y.o.   MRN: [id number] Patient has not had Prolia in over a year because she felt that it was increasing her LDL-P.  We were going to reconsider administration 05/2015 or 06/2015 however due to patients medical situation  / surgery did not feel that it was appropriate.   Called patient to discuss restarting Prolia - no ans but LMOVM  11/12/15 - confirmed that patient does want to restart Prolia - will verify insurance coverage and then call to make appt for administration.

## 2015-11-03 NOTE — Progress Notes (Signed)
Dr Laurance Flatten wanted me to remind you that pt has not had a Prolia in a year. Last DEXA 07/2014 and she has hx of brain surgery 8/16.

## 2015-11-08 LAB — FECAL OCCULT BLOOD, IMMUNOCHEMICAL: Fecal Occult Bld: NEGATIVE

## 2015-11-12 ENCOUNTER — Telehealth: Payer: Self-pay | Admitting: Pharmacist

## 2015-11-12 NOTE — Telephone Encounter (Signed)
See previous call notes.

## 2015-12-15 ENCOUNTER — Encounter: Payer: Self-pay | Admitting: Pharmacist

## 2015-12-15 ENCOUNTER — Ambulatory Visit (INDEPENDENT_AMBULATORY_CARE_PROVIDER_SITE_OTHER): Payer: Medicare Other | Admitting: Pharmacist

## 2015-12-15 VITALS — BP 134/82 | HR 70 | Ht 61.5 in | Wt 117.0 lb

## 2015-12-15 DIAGNOSIS — M810 Age-related osteoporosis without current pathological fracture: Secondary | ICD-10-CM

## 2015-12-15 DIAGNOSIS — Z0000 Encounter for general adult medical examination without abnormal findings: Secondary | ICD-10-CM | POA: Diagnosis not present

## 2015-12-15 MED ORDER — DENOSUMAB 60 MG/ML ~~LOC~~ SOLN
60.0000 mg | Freq: Once | SUBCUTANEOUS | Status: AC
  Administered 2015-12-15: 60 mg via SUBCUTANEOUS

## 2015-12-15 MED ORDER — CALCIUM-VITAMIN D-VITAMIN K 500-1000-40 MG-UNT-MCG PO CHEW: 1.0000 | CHEWABLE_TABLET | Freq: Every day | ORAL | Status: DC

## 2015-12-15 NOTE — Patient Instructions (Addendum)
  Yolanda [name] , Thank you for taking time to come for your Medicare Wellness Visit. I appreciate your ongoing commitment to your health goals. Please review the following plan we discussed and let me know if I can assist you in the future.   These are the goals we discussed: Continue to exercise regularly Continue with low fat diet that is rich fruits and vegetables.   Decrease protonix / pantoprazole 40mg  to 1 tablet every other day for 2 to 4 weeks, then try to stop.  If you have heartburn you can use tums or pepcid AC as needed.   Calcium goal is 1200mg  a day - you get 400mg  from multivitamin and probably about 250mg  from other food sources and about 150mg  from milk cereal.  You are getting about 800mg .  Only need about 400mg  from supplements.    This is a list of the screening recommended for you and due dates:  Health Maintenance  Topic Date Due  . Pneumonia vaccines (2 of 2 - PPSV23) 05/31/2016*  . Flu Shot  05/02/2016  . DEXA scan (bone density measurement)  05/02/2016  . Mammogram  05/30/2016  . Tetanus Vaccine  04/01/2021  . Shingles Vaccine  Completed  *Topic was postponed. The date shown is not the original due date.

## 2015-12-15 NOTE — Progress Notes (Signed)
Patient ID: Yolanda [name], female   DOB: 1935-07-26, 81 y.o.   MRN: [id number]    Subjective:   Yolanda [name] is a 81 y.o. female who presents for an Initial Medicare Annual Wellness Visit and for prolia injection.  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    She states that she feel good but is concerned about side effect of PPI's and would like to stop protonix if possible.   She also has stared a study at [facility] about metformin and dementia.   Review of Systems  Review of Systems  Constitutional: Negative.   HENT: Positive for hearing loss (wears hearing aids).   Eyes: Negative.   Respiratory: Negative.   Cardiovascular: Negative.   Gastrointestinal: Positive for constipation (controlled with diet).  Genitourinary: Negative.   Musculoskeletal: Negative.   Skin: Negative.   Neurological: Negative.   Endo/Heme/Allergies: Negative.   Psychiatric/Behavioral: Negative.      Current Medications (verified) Outpatient Encounter Prescriptions as of 12/15/2015  Medication Sig  . acyclovir ointment (ZOVIRAX) 5 % Apply 1 application topically every 3 (three) hours. Apply frequently and sparingly to cold sores as needed.  Marland Kitchen aspirin EC 81 MG tablet Take 81 mg by mouth daily.  . Cholecalciferol (VITAMIN D3) 1000 UNITS CAPS Take by mouth 2 (two) times daily.    . CO ENZYME Q-10 PO Take 1 capsule by mouth daily.   . colesevelam (WELCHOL) 625 MG tablet Take 3 tablets (1,875 mg total) by mouth 2 (two) times daily with a meal. Take one tablet by mouth three times a day  . fluticasone (FLONASE) 50 MCG/ACT nasal spray 1-2 sprays each nostril daily as directed for allergic rhinitis  . hydrochlorothiazide (MICROZIDE) 12.5 MG capsule Take 1 capsule (12.5 mg total) by mouth daily.  . IRON PO Take 1 tablet by mouth every other day.   . Multiple Vitamin (MULTI-VITAMIN PO) Take 1 tablet by mouth daily.   Marland Kitchen omega-3 acid ethyl esters (LOVAZA) 1 G capsule Take 2 capsules (2 g total) by mouth 2 (two)  times daily.  . pantoprazole (PROTONIX) 40 MG tablet TAKE 1 TABLET DAILY  . pravastatin (PRAVACHOL) 80 MG tablet Take 1 tablet (80 mg total) by mouth daily.  . valACYclovir (VALTREX) 1000 MG tablet Take 1 tablet (1,000 mg total) by mouth daily. As directed (Patient taking differently: Take 1,000 mg by mouth daily as needed. As directed)  . Calcium-Vitamin D-Vitamin K (CALCIUM + D) [phone] MG-UNT-MCG CHEW Chew 1 tablet by mouth daily.  Marland Kitchen denosumab (PROLIA) 60 MG/ML SOLN injection Inject 60 mg into the skin every 6 (six) months. Administer in upper arm, thigh, or abdomen  . [DISCONTINUED] calcium carbonate 200 MG capsule Take 1 capsule (200 mg total) by mouth daily. (Patient taking differently: Take 1,200 mg by mouth daily. )  . [EXPIRED] denosumab (PROLIA) injection 60 mg    No facility-administered encounter medications on file as of 12/15/2015.    Allergies (verified) Sulfonamide derivatives; K+ care; Lipitor; and Sulfa antibiotics   History: Past Medical History  Diagnosis Date  . Thyroid nodule 8/08  . Lung nodule 1/09  . Fuch's endothelial dystrophy 04/2009    Dr, Shanon Rosser   . Osteoporosis   . CAD (coronary artery disease)     LAD lession   . Osteopenia   . Hypertension   . Hyperlipidemia   . Esophagitis, acute   . GERD (gastroesophageal reflux disease) 11/00  . Gastritis 11/00  . Diverticulosis 11/00  . Hemorrhoids 8/05  .  NSVD (normal spontaneous vaginal delivery) 1967    female   . Allergy     seasonal  . Anemia   . Arthritis   . Cancer ([place])     skin  . Cataract     removed bilaterally  . Headache   . Benign neoplasm of descending colon 10/22/2014   Past Surgical History  Procedure Laterality Date  . Bilateral cataract surgery  06/2007    DR. Groat   . Bilateral cataract surgery  10/08    Dr. Katy Fitch   . Facial cosmetic surgery  7/96    Dr. Mikle Bosworth    . Cardiac cath with angioplasty & stent replacement  4/98  . Cardiac cath 40-50%lad  6/07  . Ct lung  screening  6/07  . Colonoscopy    . Meniscus repair    . Craniotomy Left 05/13/2015    Procedure: Left frontotemporal Craniotomy for Meningioma resection;  Surgeon: Consuella Lose, MD;  Location: [facility];  Service: Neurosurgery;  Laterality: Left;   Family History  Problem Relation Age of Onset  . Breast cancer Sister     lumpectomy  . Heart disease Sister   . Stroke Sister   . GER disease Sister   . Dementia Sister   . Hepatitis Brother   . Heart attack Brother   . Stroke Brother   . Diabetes Mother   . Hypertension Mother   . Diabetes Father   . Hypertension Father   . Alzheimer's disease Sister   . Colon cancer Neg Hx    Social History   Occupational History  . Not on file.   Social History Main Topics  . Smoking status: Former Research scientist (life sciences)  . Smokeless tobacco: Never Used  . Alcohol Use: No  . Drug Use: No  . Sexual Activity: No    Do you feel safe at home?  Yes  Dietary issues and exercise activities: Current Exercise Habits: Home exercise routine;Structured exercise class, Time (Minutes): 60, Frequency (Times/Week): 4, Weekly Exercise (Minutes/Week): 240, Intensity: Moderate  Current Dietary habits:  Eats spinach for regularity; avoids fried foot and high fat foods since CAD diagnosed 2007.  Objective:    Today's Vitals   12/15/15 1153  BP: 134/82  Pulse: 70  Height: 5' 1.5" (1.562 m)  Weight: 117 lb (53.071 kg)  PainSc: 0-No pain   Body mass index is 21.75 kg/(m^2).  Activities of Daily Living In your present state of health, do you have any difficulty performing the following activities: 12/15/2015 05/13/2015  Hearing? Y N  Vision? Y N  Difficulty concentrating or making decisions? Y N  Walking or climbing stairs? N N  Dressing or bathing? N N  Doing errands, shopping? N Y  Conservation officer, nature and eating ? N -  Using the Toilet? N -  In the past six months, have you accidently leaked urine? N -  Do you have problems with loss of bowel control? N -    Managing your Medications? N -  Managing your Finances? N -  Housekeeping or managing your Housekeeping? N -    Are there smokers in your home (other than you)? No   Cardiac Risk Factors include: advanced age (>38men, >51 women);dyslipidemia;family history of premature cardiovascular disease  Depression Screen PHQ 2/9 Scores 12/15/2015 11/01/2015 05/25/2015 05/04/2014  PHQ - 2 Score 0 0 0 0    Fall Risk Fall Risk  11/01/2015 05/25/2015 05/04/2014 04/14/2014 04/02/2013  Falls in the past year? No Yes - Yes No  Number falls in past yr: - 1 1 1  -  Injury with Fall? - No Yes No -  Risk for fall due to : - - History of fall(s) - -    Cognitive Function: MMSE - Mini Mental State Exam 12/15/2015  Orientation to time 5  Orientation to Place 5  Registration 3  Attention/ Calculation 5  Recall 3  Language- name 2 objects 2  Language- repeat 1  Language- follow 3 step command 3  Language- read & follow direction 1  Write a sentence 1  Copy design 1  Total score 30    Immunizations and Health Maintenance Immunization History  Administered Date(s) Administered  . Influenza, High Dose Seasonal PF 07/12/2015  . Pneumococcal Conjugate-13 08/21/2013   There are no preventive care reminders to display for this patient.  Patient Care Team: Chipper Herb, MD as PCP - General (Family Medicine) Minus Breeding, MD (Cardiology) Clent Jacks, MD (Ophthalmology) Inda Castle, MD as Consulting Physician (Gastroenterology)  Indicate any recent Medical Services you may have received from other than [facility] providers in the past year (date may be approximate).    Assessment:    Annual Wellness Visit  Osteoporosis Medication management   Screening Tests Health Maintenance  Topic Date Due  . PNA vac Low Risk Adult (2 of 2 - PPSV23) 05/31/2016 (Originally 08/21/2014)  . INFLUENZA VACCINE  05/02/2016  . DEXA SCAN  05/02/2016  . MAMMOGRAM  05/30/2016  . TETANUS/TDAP  04/01/2021  . ZOSTAVAX   Completed        Plan:   During the course of the visit Avaline was educated and counseled about the following appropriate screening and preventive services:   Vaccines to include Pneumoccal, Influenza, Hepatitis B, Td, Zostavax - UTD  Colorectal cancer screening - UTD  Cardiovascular disease screening - UTD  Diabetes screening - UTD  Bone Denisty / Osteoporosis Screening - due in August 2017 - will get with next prolia which is due 06/2016  Prolia 60mg  SQ administered today.  Mammogram - UTD  PAP - no longer required  Glaucoma screening /  Eye Exam - UTD  Nutrition counseling - continue to limit fat intake and eat a variety of vegetables.    Calcium goal is 1200mg  a day - patient is getting 400mg  from multivitamin and probably about 250mg  from other food sources and about 150mg  from milk in cereal or about 800mg .  Only need about 400mg  from supplements.  Taper off pantoprazole / protoninx - take every other day for 2-4 weeks and then stop. May use tums or pepcid [facility] as needed for heartburn.   Continue with physical acitivity - goal is 150 minutes per week  Advanced Directives - patient states copy has been provided to [facility] in 2008  Patient Instructions (the written plan) were given to the patient.   Cherre Robins, [facility]   12/15/2015

## 2016-01-01 ENCOUNTER — Other Ambulatory Visit: Payer: Self-pay | Admitting: Family Medicine

## 2016-02-03 DIAGNOSIS — D329 Benign neoplasm of meninges, unspecified: Secondary | ICD-10-CM | POA: Diagnosis not present

## 2016-02-09 DIAGNOSIS — D329 Benign neoplasm of meninges, unspecified: Secondary | ICD-10-CM | POA: Diagnosis not present

## 2016-03-08 ENCOUNTER — Encounter: Payer: Self-pay | Admitting: Family Medicine

## 2016-03-08 ENCOUNTER — Ambulatory Visit (INDEPENDENT_AMBULATORY_CARE_PROVIDER_SITE_OTHER): Payer: Medicare Other | Admitting: Family Medicine

## 2016-03-08 VITALS — BP 159/89 | HR 66 | Temp 97.0°F | Ht 61.5 in | Wt 113.0 lb

## 2016-03-08 DIAGNOSIS — Q667 Congenital pes cavus, unspecified foot: Secondary | ICD-10-CM

## 2016-03-08 DIAGNOSIS — K219 Gastro-esophageal reflux disease without esophagitis: Secondary | ICD-10-CM | POA: Diagnosis not present

## 2016-03-08 DIAGNOSIS — M810 Age-related osteoporosis without current pathological fracture: Secondary | ICD-10-CM

## 2016-03-08 DIAGNOSIS — K5901 Slow transit constipation: Secondary | ICD-10-CM

## 2016-03-08 DIAGNOSIS — E785 Hyperlipidemia, unspecified: Secondary | ICD-10-CM

## 2016-03-08 DIAGNOSIS — I2510 Atherosclerotic heart disease of native coronary artery without angina pectoris: Secondary | ICD-10-CM | POA: Diagnosis not present

## 2016-03-08 DIAGNOSIS — I10 Essential (primary) hypertension: Secondary | ICD-10-CM

## 2016-03-08 DIAGNOSIS — E559 Vitamin D deficiency, unspecified: Secondary | ICD-10-CM | POA: Diagnosis not present

## 2016-03-08 DIAGNOSIS — D329 Benign neoplasm of meninges, unspecified: Secondary | ICD-10-CM

## 2016-03-08 MED ORDER — HYDROCHLOROTHIAZIDE 12.5 MG PO CAPS: 12.5000 mg | ORAL_CAPSULE | Freq: Every day | ORAL | Status: DC

## 2016-03-08 MED ORDER — PRAVASTATIN SODIUM 80 MG PO TABS: 80.0000 mg | ORAL_TABLET | Freq: Every day | ORAL | Status: DC

## 2016-03-08 NOTE — Patient Instructions (Addendum)
Medicare Annual Wellness Visit  [facility] and the medical providers at [facility] strive to bring you the best medical care.  In doing so we not only want to address your current medical conditions and concerns but also to detect new conditions early and prevent illness, disease and health-related problems.    Medicare offers a yearly Wellness Visit which allows our clinical staff to assess your need for preventative services including immunizations, lifestyle education, counseling to decrease risk of preventable diseases and screening for fall risk and other medical concerns.    This visit is provided free of charge (no copay) for all Medicare recipients. The clinical pharmacists at [facility] have begun to conduct these Wellness Visits which will also include a thorough review of all your medications.    As you primary medical provider recommend that you make an appointment for your Annual Wellness Visit if you have not done so already this year.  You may set up this appointment before you leave today or you may call back [phone] and schedule an appointment.  Please make sure when you call that you mention that you are scheduling your Annual Wellness Visit with the clinical pharmacist so that the appointment may be made for the proper length of time.     Continue current medications. Continue good therapeutic lifestyle changes which include good diet and exercise. Fall precautions discussed with patient. If an FOBT was given today- please return it to our front desk. If you are over 12 years old - you may need Prevnar 55 or the adult Pneumonia vaccine.  **Flu shots are available--- please call and schedule a FLU-CLINIC appointment**  After your visit with [place] today you will receive a survey in the mail or online from [employer] regarding your care with [place]. Please take a moment to fill this out. Your feedback is very  important to [place] as you can help [place] better understand your patient needs as well as improve your experience and satisfaction. WE CARE ABOUT YOU!!!   We will give you a prescription for orthotics for your shoes Please try ranitidine 150 mg, the equate brand and take this twice daily in place of the proton X and after several weeks of taking this she may be able to reduce this to once daily or as needed for your reflux. Obviously continue to avoid caffeine and irritating substances on your stomach. Drink plenty of fluids and stay well hydrated I mentioned to you about the le croix which is a flavored water drink at you can purchase over-the-counter that has no caries. Grocery stores have this. Walk and exercise regularly and always try to prevent any falls and avoid climbing

## 2016-03-08 NOTE — Progress Notes (Signed)
Subjective:    Patient ID: Yolanda [name], female    DOB: [birth year], 81 y.o.   MRN: [id number]  HPI Pt here for follow up and management of chronic medical problems which includes hypertension and hyperlipidemia. She is taking medications regularly.The patient is doing well overall. She is trying to come off of her proton X for her GERD and has had some problems in doing this. We discussed taking ranitidine over-the-counter twice daily before breakfast and supper and she will try that. She also has had a recent MRI of her head as a follow-up to the August 2016 surgery for a meningioma. She said everything was stable. She will get another MRI in 6 months and then another one in 1 year after that and hopefully will have to do no more of those scans. She denies any chest pain or shortness of breath. She does have some reflux symptoms secondary to trying to stop her proton X. She also has constipation off and on and he admits to not drinking enough fluids. She will try to increase her fluid intake. She denies any black tarry stools and does have occasional bright red blood but the skull from hemorrhoids. She did have a colonoscopy a couple years ago and she said the gastroenterologist said that she did not need any more colonoscopies. She is passing her water without problems. She exercises regularly. She is in good spirits and has always tried to maintain good health.     Patient Active Problem List   Diagnosis Date Noted  . Meningioma ([place]) 05/13/2015  . Benign neoplasm of descending colon 10/22/2014  . Osteoporosis, postmenopausal 10/16/2013  . Allergic rhinitis 08/20/2013  . Vitamin D deficiency 08/20/2013  . GERD (gastroesophageal reflux disease) 08/20/2013  . THYROID NODULE 01/11/2009  . HYPERLIPIDEMIA-MIXED 01/11/2009  . ANEMIA 01/11/2009  . Essential hypertension 01/11/2009  . Coronary atherosclerosis 01/11/2009  . CAROTID STENOSIS 01/11/2009   Outpatient Encounter Prescriptions as of  03/08/2016  Medication Sig  . acyclovir ointment (ZOVIRAX) 5 % Apply 1 application topically every 3 (three) hours. Apply frequently and sparingly to cold sores as needed.  Marland Kitchen aspirin EC 81 MG tablet Take 81 mg by mouth daily.  . Calcium-Vitamin D-Vitamin K (CALCIUM + D) [phone] MG-UNT-MCG CHEW Chew 1 tablet by mouth daily.  . Cholecalciferol (VITAMIN D3) 1000 UNITS CAPS Take by mouth 2 (two) times daily.    . CO ENZYME Q-10 PO Take 1 capsule by mouth daily.   . colesevelam (WELCHOL) 625 MG tablet Take 3 tablets (1,875 mg total) by mouth 2 (two) times daily with a meal. Take one tablet by mouth three times a day  . denosumab (PROLIA) 60 MG/ML SOLN injection Inject 60 mg into the skin every 6 (six) months. Administer in upper arm, thigh, or abdomen  . fluticasone (FLONASE) 50 MCG/ACT nasal spray 1-2 sprays each nostril daily as directed for allergic rhinitis  . hydrochlorothiazide (MICROZIDE) 12.5 MG capsule Take 1 capsule (12.5 mg total) by mouth daily.  . IRON PO Take 1 tablet by mouth every other day.   . Multiple Vitamin (MULTI-VITAMIN PO) Take 1 tablet by mouth daily.   Marland Kitchen omega-3 acid ethyl esters (LOVAZA) 1 g capsule TAKE 2 CAPSULES (=2GM)     TWICE DAILY  . pantoprazole (PROTONIX) 40 MG tablet TAKE 1 TABLET DAILY  . pravastatin (PRAVACHOL) 80 MG tablet TAKE 1 TABLET DAILY  . valACYclovir (VALTREX) 1000 MG tablet Take 1 tablet (1,000 mg total) by mouth daily. As directed (  Patient taking differently: Take 1,000 mg by mouth daily as needed. As directed)   No facility-administered encounter medications on file as of 03/08/2016.      Review of Systems  Constitutional: Negative.   HENT: Negative.   Eyes: Negative.   Respiratory: Negative.   Cardiovascular: Negative.   Gastrointestinal: Negative.        GERD   Endocrine: Negative.   Genitourinary: Negative.   Musculoskeletal: Negative.   Skin: Negative.   Allergic/Immunologic: Negative.   Neurological: Negative.   Hematological:  Negative.   Psychiatric/Behavioral: Negative.        Objective:   Physical Exam  Constitutional: She is oriented to person, place, and time. She appears well-developed and well-nourished. No distress.  Pleasant and alert  HENT:  Head: Normocephalic and atraumatic.  Right Ear: External ear normal.  Left Ear: External ear normal.  Mouth/Throat: Oropharynx is clear and moist. No oropharyngeal exudate.  Nasal congestion bilaterally  Eyes: Conjunctivae and EOM are normal. Pupils are equal, round, and reactive to light. Right eye exhibits no discharge. Left eye exhibits no discharge. No scleral icterus.  She is up-to-date on her eye exams  Neck: Normal range of motion. Neck supple. No JVD present. No thyromegaly present.  No thyromegaly or anterior cervical adenopathy  Cardiovascular: Normal rate, regular rhythm and intact distal pulses.   No murmur heard. Heart has a regular rate and rhythm at 72/m  Pulmonary/Chest: Effort normal and breath sounds normal. No respiratory distress. She has no wheezes. She has no rales. She exhibits no tenderness.  Clear anteriorly and posteriorly, no axillary adenopathy  Abdominal: Soft. Bowel sounds are normal. She exhibits no mass. There is no tenderness. There is no rebound and no guarding.  No epigastric tenderness liver or spleen enlargement or inguinal adenopathy  Musculoskeletal: Normal range of motion. She exhibits no edema or tenderness.  Lymphadenopathy:    She has no cervical adenopathy.  Neurological: She is alert and oriented to person, place, and time. She has normal reflexes. No cranial nerve deficit.  Skin: Skin is warm and dry. No rash noted.  Psychiatric: She has a normal mood and affect. Her behavior is normal. Judgment and thought content normal.  Nursing note and vitals reviewed.   BP 154/80 mmHg  Pulse 66  Temp(Src) 97 F (36.1 C) (Oral)  Ht 5' 1.5" (1.562 m)  Wt 113 lb (51.256 kg)  BMI 21.01 kg/m2       Assessment & Plan:   1. Essential hypertension -The blood pressure is slightly elevated. We will continue to monitor this. The patient should watch her sodium intake and keep her weight down as much as possible - BMP8+EGFR - CBC with Differential/Platelet - Hepatic function panel  2. Vitamin D deficiency -Continue with current treatment pending results of lab work - CBC with Differential/Platelet - VITAMIN D 25 Hydroxy (Vit-D Deficiency, Fractures)  3. Gastroesophageal reflux disease, esophagitis presence not specified -The patient is trying to avoid and get off of proton pump inhibitors like proton X. She will gradually switch over to ranitidine 150 twice daily before breakfast and supper. - CBC with Differential/Platelet - Hepatic function panel  4. Hyperlipemia -Continue current treatment pending results of lab work - CBC with Differential/Platelet - NMR, lipoprofile  5. Meningioma ([place]) -Continue follow-up with neurosurgery - CBC with Differential/Platelet  6. Osteoporosis, postmenopausal -Continue aggressive management with weightbearing exercise walking and current vitamin D and calcium replacement - CBC with Differential/Platelet  7. Hyperlipidemia -Continue with pravastatin and aggressive therapeutic  lifestyle changes - CBC with Differential/Platelet  8. Slow transit constipation -Increase fluid intake  Meds ordered this encounter  Medications  . pravastatin (PRAVACHOL) 80 MG tablet    Sig: Take 1 tablet (80 mg total) by mouth daily.    Dispense:  90 tablet    Refill:  1  . hydrochlorothiazide (MICROZIDE) 12.5 MG capsule    Sig: Take 1 capsule (12.5 mg total) by mouth daily.    Dispense:  90 capsule    Refill:  1   Patient Instructions                       Medicare Annual Wellness Visit  [facility] and the medical providers at [facility] strive to bring you the best medical care.  In doing so we not only want to address your current medical conditions  and concerns but also to detect new conditions early and prevent illness, disease and health-related problems.    Medicare offers a yearly Wellness Visit which allows our clinical staff to assess your need for preventative services including immunizations, lifestyle education, counseling to decrease risk of preventable diseases and screening for fall risk and other medical concerns.    This visit is provided free of charge (no copay) for all Medicare recipients. The clinical pharmacists at [facility] have begun to conduct these Wellness Visits which will also include a thorough review of all your medications.    As you primary medical provider recommend that you make an appointment for your Annual Wellness Visit if you have not done so already this year.  You may set up this appointment before you leave today or you may call back [phone]) and schedule an appointment.  Please make sure when you call that you mention that you are scheduling your Annual Wellness Visit with the clinical pharmacist so that the appointment may be made for the proper length of time.     Continue current medications. Continue good therapeutic lifestyle changes which include good diet and exercise. Fall precautions discussed with patient. If an FOBT was given today- please return it to our front desk. If you are over 64 years old - you may need Prevnar 73 or the adult Pneumonia vaccine.  **Flu shots are available--- please call and schedule a FLU-CLINIC appointment**  After your visit with [place] today you will receive a survey in the mail or online from [employer] regarding your care with [place]. Please take a moment to fill this out. Your feedback is very important to [place] as you can help [place] better understand your patient needs as well as improve your experience and satisfaction. WE CARE ABOUT YOU!!!   We will give you a prescription for orthotics for your shoes Please try ranitidine 150 mg, the equate  brand and take this twice daily in place of the proton X and after several weeks of taking this she may be able to reduce this to once daily or as needed for your reflux. Obviously continue to avoid caffeine and irritating substances on your stomach. Drink plenty of fluids and stay well hydrated I mentioned to you about the le croix which is a flavored water drink at you can purchase over-the-counter that has no caries. Grocery stores have this. Walk and exercise regularly and always try to prevent any falls and avoid climbing   Arrie Senate MD

## 2016-03-09 LAB — CBC WITH DIFFERENTIAL/PLATELET
BASOS: 0 %
Basophils Absolute: 0 10*3/uL (ref 0.0–0.2)
EOS (ABSOLUTE): 0.4 10*3/uL (ref 0.0–0.4)
EOS: 5 %
HEMATOCRIT: 42.4 % (ref 34.0–46.6)
Hemoglobin: 14.3 g/dL (ref 11.1–15.9)
IMMATURE GRANULOCYTES: 0 %
Immature Grans (Abs): 0 10*3/uL (ref 0.0–0.1)
LYMPHS ABS: 2.7 10*3/uL (ref 0.7–3.1)
Lymphs: 32 %
MCH: 31.8 pg (ref 26.6–33.0)
MCHC: 33.7 g/dL (ref 31.5–35.7)
MCV: 94 fL (ref 79–97)
MONOS ABS: 0.9 10*3/uL (ref 0.1–0.9)
Monocytes: 11 %
NEUTROS PCT: 52 %
Neutrophils Absolute: 4.4 10*3/uL (ref 1.4–7.0)
PLATELETS: 186 10*3/uL (ref 150–379)
RBC: 4.5 x10E6/uL (ref 3.77–5.28)
RDW: 13.4 % (ref 12.3–15.4)
WBC: 8.4 10*3/uL (ref 3.4–10.8)

## 2016-03-09 LAB — HEPATIC FUNCTION PANEL
ALT: 13 IU/L (ref 0–32)
AST: 25 IU/L (ref 0–40)
Albumin: 4.3 g/dL (ref 3.5–4.8)
Alkaline Phosphatase: 41 IU/L (ref 39–117)
BILIRUBIN TOTAL: 0.6 mg/dL (ref 0.0–1.2)
Bilirubin, Direct: 0.17 mg/dL (ref 0.00–0.40)
Total Protein: 6.6 g/dL (ref 6.0–8.5)

## 2016-03-09 LAB — BMP8+EGFR
BUN / CREAT RATIO: 13 (ref 12–28)
BUN: 13 mg/dL (ref 8–27)
CO2: 25 mmol/L (ref 18–29)
CREATININE: 0.99 mg/dL (ref 0.57–1.00)
Calcium: 9.9 mg/dL (ref 8.7–10.3)
Chloride: 103 mmol/L (ref 96–106)
GFR calc Af Amer: 63 mL/min/{1.73_m2} (ref >59)
GFR calc non Af Amer: 54 mL/min/{1.73_m2} — ABNORMAL LOW (ref >59)
GLUCOSE: 78 mg/dL (ref 65–99)
POTASSIUM: 4.8 mmol/L (ref 3.5–5.2)
SODIUM: 144 mmol/L (ref 134–144)

## 2016-03-09 LAB — NMR, LIPOPROFILE
Cholesterol: 186 mg/dL (ref 100–199)
HDL CHOLESTEROL BY NMR: 89 mg/dL (ref >39)
HDL Particle Number: 45.1 umol/L (ref >=30.5)
LDL PARTICLE NUMBER: 812 nmol/L (ref <1000)
LDL Size: 21.4 nm (ref >20.5)
LDL-C: 75 mg/dL (ref 0–99)
Small LDL Particle Number: 216 nmol/L (ref <=527)
Triglycerides by NMR: 108 mg/dL (ref 0–149)

## 2016-03-09 LAB — VITAMIN D 25 HYDROXY (VIT D DEFICIENCY, FRACTURES): Vit D, 25-Hydroxy: 53 ng/mL (ref 30.0–100.0)

## 2016-03-31 IMAGING — MR MR HEAD WO/W CM
10 of 14 series · 33 of 48 positions shown · IV contrast (multihance)
Comparison: Outside MRI 04/09/2015

CLINICAL DATA: Resection of planum sphenoidale meningioma
05/13/2015. Severe visual loss left eye preoperatively . Unroofing
of optic canal for decompression of optic nerve.

EXAM:
MRI HEAD WITHOUT AND WITH CONTRAST
TECHNIQUE: Multiplanar, multiecho pulse sequences of the brain and surrounding
structures were obtained without and with intravenous contrast.
CONTRAST:  10mL MULTIHANCE GADOBENATE DIMEGLUMINE 529 MG/ML IV SOLN

[Series 4: DWI · axial · 3.0mm · 1.09mm/px · z∈[-51,+86]mm · 8 of 94 slices shown (1 of 4)]
[im 1/94]
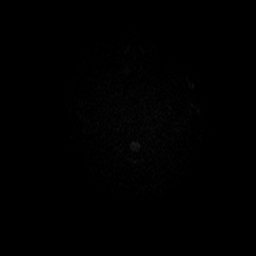
[im 14/94]
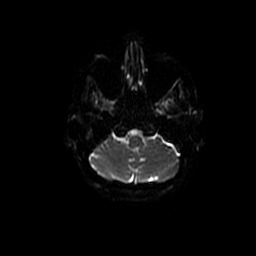
[im 27/94]
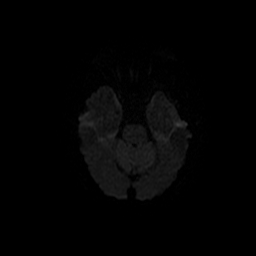
[im 40/94]
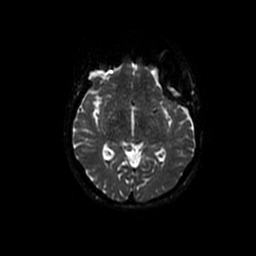
[im 54/94]
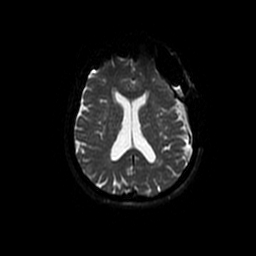
[im 67/94]
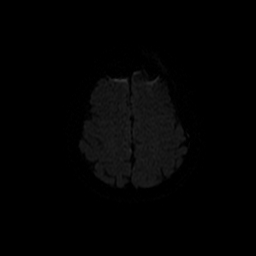
[im 80/94]
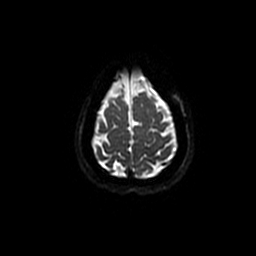
[im 94/94]
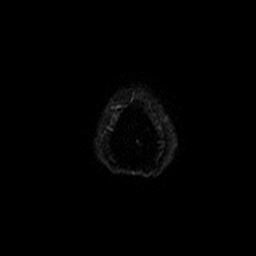

[Series 5: T2 · axial · 5.0mm · 0.43mm/px · z∈[-48,+90]mm · 2 of 24 slices shown]
[im 1/24]
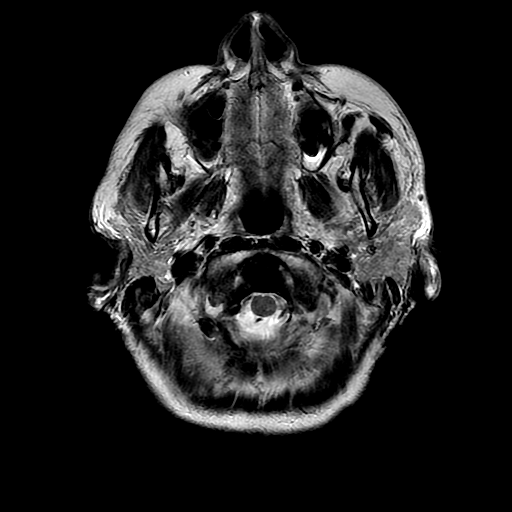
[im 24/24]
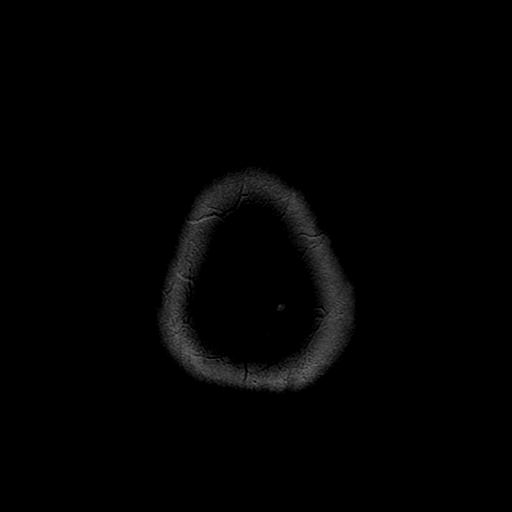

[Series 6: DWI · coronal · 5.0mm · 1.09mm/px · 6 of 72 slices shown (2 of 4)]
[im 1/72]
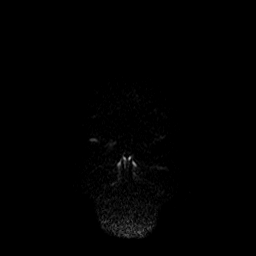
[im 15/72]
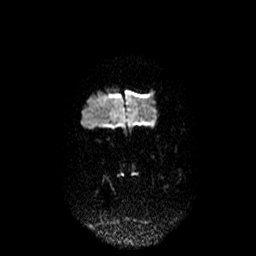
[im 29/72]
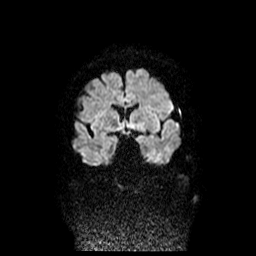
[im 43/72]
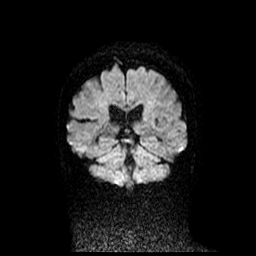
[im 57/72]
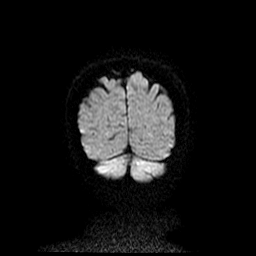
[im 72/72]
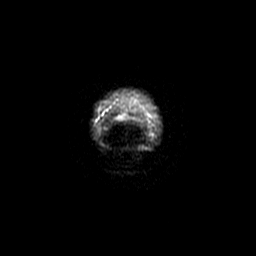

[Series 7: FLAIR · axial · 5.0mm · 0.43mm/px · z∈[-49,+91]mm · 2 of 21 slices shown (1 of 2)]
[im 1/21]
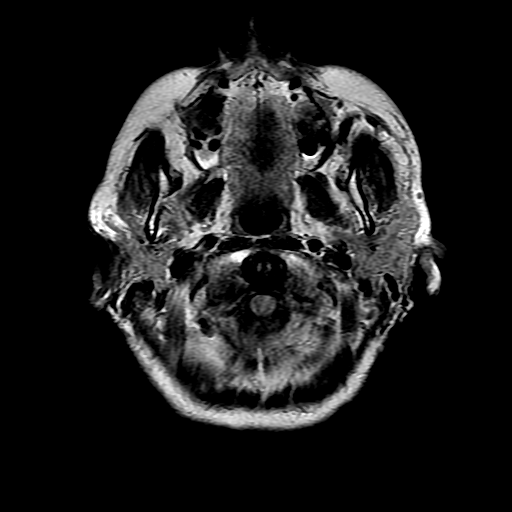
[im 21/21]
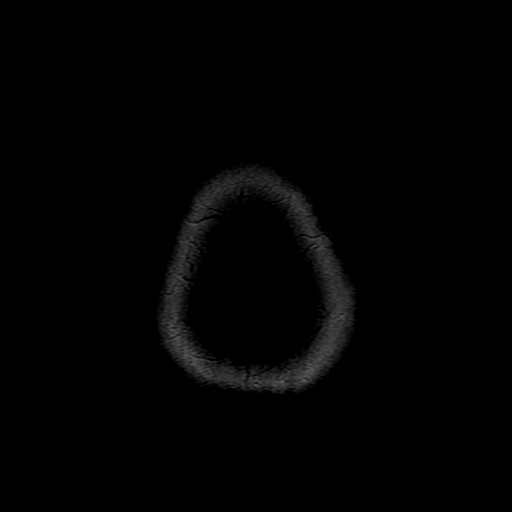

[Series 9: FLAIR · axial · 5.0mm · 0.43mm/px · z∈[-50,+93]mm · 2 of 25 slices shown (2 of 2)]
[im 1/25]
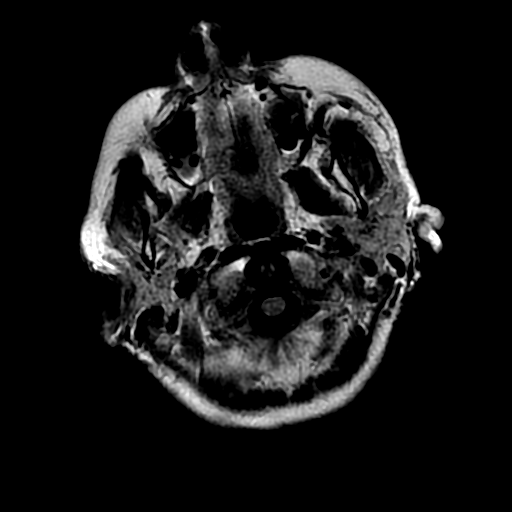
[im 25/25]
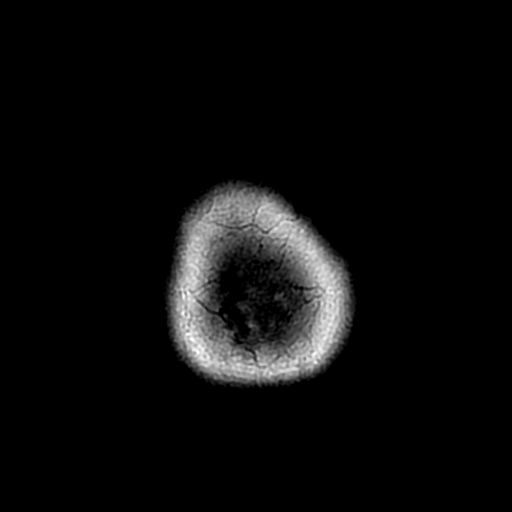

[Series 12: T2 post-contrast · coronal · 5.0mm · 0.39mm/px · 2 of 28 slices shown]
[im 1/28]
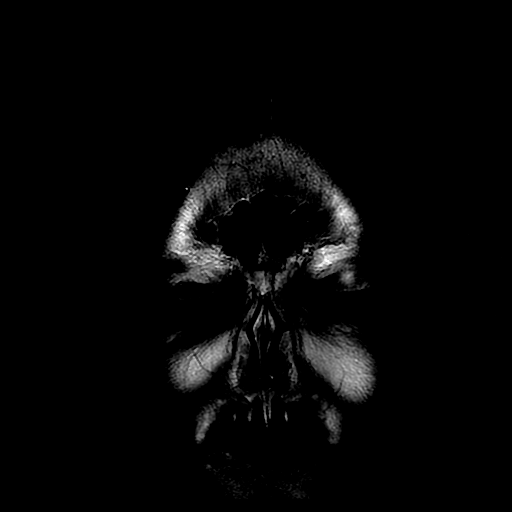
[im 28/28]
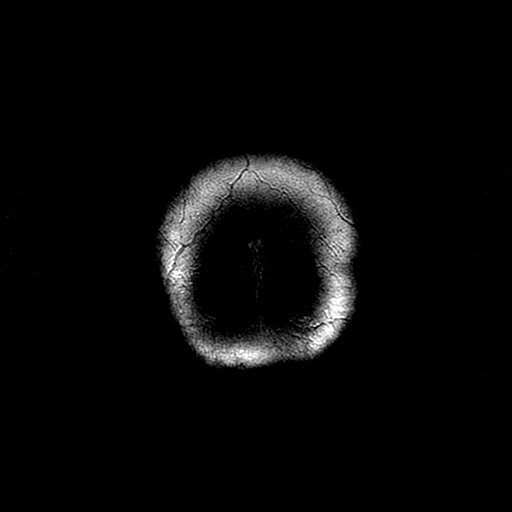

[Series 14: T1 post-contrast · coronal · 5.0mm · 0.39mm/px · 2 of 28 slices shown (1 of 2)]
[im 1/28]
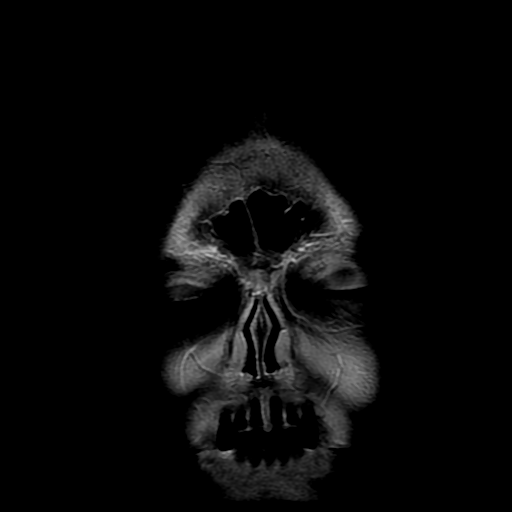
[im 28/28]
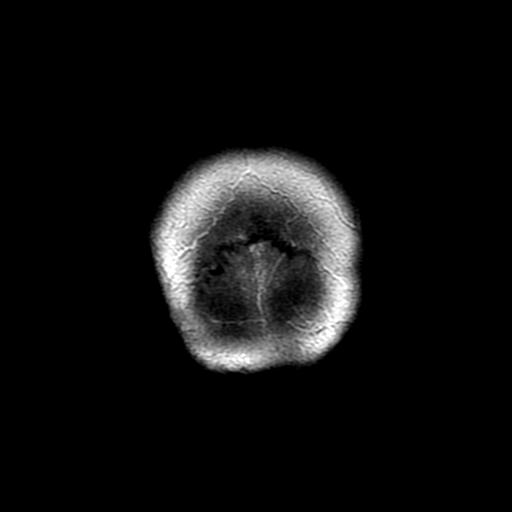

[Series 15: T1 post-contrast · sagittal · 5.0mm · 0.47mm/px · 2 of 23 slices shown (2 of 2)]
[im 1/23]
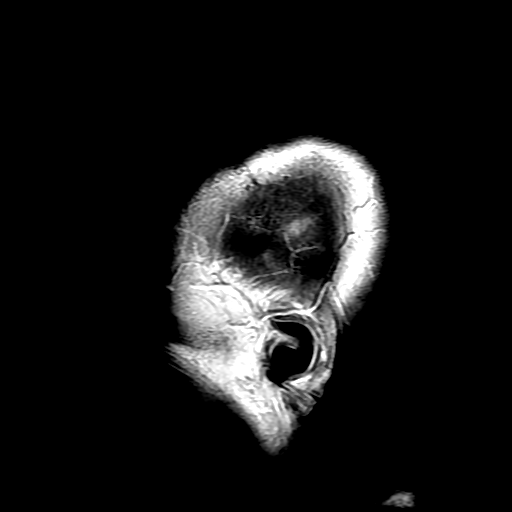
[im 23/23]
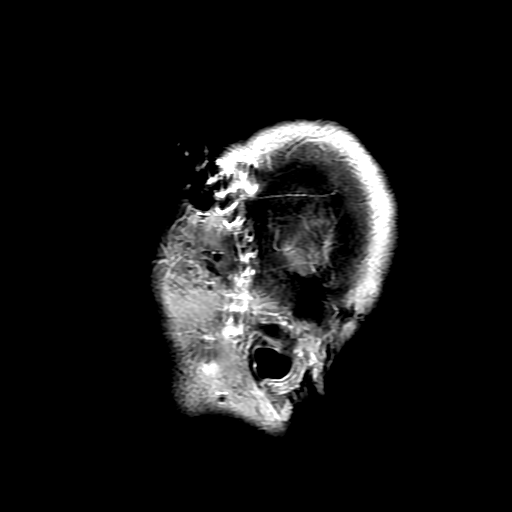

[Series 400: DWI · axial · 3.0mm · 1.09mm/px · z∈[-51,+86]mm · 4 of 47 slices shown (3 of 4)]
[im 1/47]
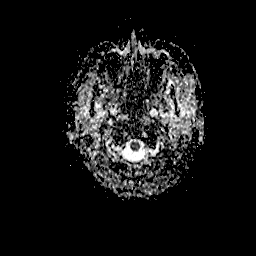
[im 16/47]
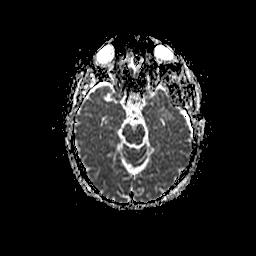
[im 31/47]
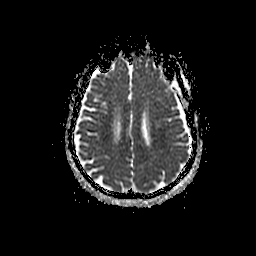
[im 47/47]
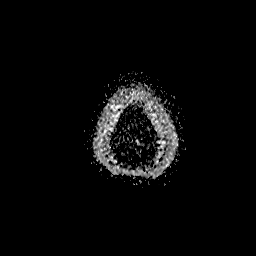

[Series 600: DWI · coronal · 5.0mm · 1.09mm/px · 3 of 36 slices shown (4 of 4)]
[im 1/36]
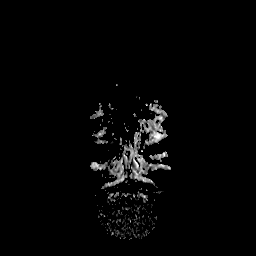
[im 18/36]
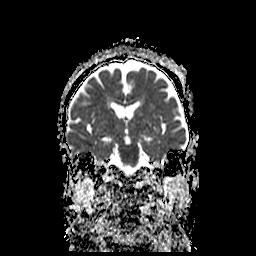
[im 36/36]
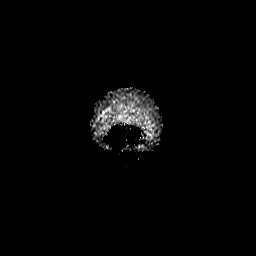

[33 of 48 positions shown; findings below may reference images not displayed]

FINDINGS: Left pterional craniotomy for tumor resection. Moderate
pneumocephalus anteriorly. Left frontal fluid collection containing
gas and fluid measuring maximally 16 mm in diameter. There is
mass-effect on the left frontal lobe . No shift of the midline
structures. Ventricle size normal.

Mild intraventricular hemorrhage in the occipital horns. Small
amount of blood in the sella with fluid level noted.

Negative for acute infarction. Mild chronic microvascular ischemic
changes in the white matter as noted on the preoperative MRI.

Post contrast scanning reveals gross total resection of small planum
sphenoidale meningioma. No residual tumor identified.

Normal enhancement of the dural sinuses. Paranasal sinuses clear.
Small effusion right mastoid tip. Left mastoid sinus clear.
IMPRESSION: Successful gross total resection of small the none planum
sphenoidale meningioma

Mild ventricular and subarachnoid hemorrhage.

Pneumocephalus anteriorly bilaterally. Left frontal subdural fluid
and gas collection with mass-effect on the left frontal lobe. No
shift of the midline structures.

Negative for acute infarct.

## 2016-04-06 DIAGNOSIS — Z961 Presence of intraocular lens: Secondary | ICD-10-CM | POA: Diagnosis not present

## 2016-04-06 DIAGNOSIS — H469 Unspecified optic neuritis: Secondary | ICD-10-CM | POA: Diagnosis not present

## 2016-04-06 DIAGNOSIS — D3132 Benign neoplasm of left choroid: Secondary | ICD-10-CM | POA: Diagnosis not present

## 2016-04-06 DIAGNOSIS — H1851 Endothelial corneal dystrophy: Secondary | ICD-10-CM | POA: Diagnosis not present

## 2016-04-06 DIAGNOSIS — H04123 Dry eye syndrome of bilateral lacrimal glands: Secondary | ICD-10-CM | POA: Diagnosis not present

## 2016-04-14 ENCOUNTER — Other Ambulatory Visit: Payer: Self-pay | Admitting: Family Medicine

## 2016-05-10 ENCOUNTER — Ambulatory Visit: Payer: Self-pay | Admitting: Pharmacist

## 2016-06-27 DIAGNOSIS — Z23 Encounter for immunization: Secondary | ICD-10-CM | POA: Diagnosis not present

## 2016-07-03 ENCOUNTER — Encounter: Payer: Self-pay | Admitting: Pharmacist

## 2016-07-03 ENCOUNTER — Ambulatory Visit (INDEPENDENT_AMBULATORY_CARE_PROVIDER_SITE_OTHER): Payer: Medicare Other | Admitting: Pharmacist

## 2016-07-03 ENCOUNTER — Ambulatory Visit (INDEPENDENT_AMBULATORY_CARE_PROVIDER_SITE_OTHER): Payer: Medicare Other

## 2016-07-03 VITALS — BP 142/82 | HR 70 | Ht 62.0 in | Wt 114.0 lb

## 2016-07-03 DIAGNOSIS — M810 Age-related osteoporosis without current pathological fracture: Secondary | ICD-10-CM

## 2016-07-03 MED ORDER — DENOSUMAB 60 MG/ML ~~LOC~~ SOLN
60.0000 mg | Freq: Once | SUBCUTANEOUS | Status: AC
  Administered 2016-07-03: 60 mg via SUBCUTANEOUS

## 2016-07-03 NOTE — Progress Notes (Signed)
Osteoporosis Clinic Current Height: Height: 5\' 2"  (157.5 cm)       Current Weight: Weight: 114 lb (51.7 kg)       Ethnicity:Caucasian  BP: BP: (!) 142/82     HR:  Pulse Rate: 70      HPI: Patient with osteoporosis currently taking Prolia 60mg  SQ since 04/2012 except for 1 year when she held due to concerns that Prolia was causing cholesterol to increase.  Was determined that there was no correlation and she restarted about 1 year ago.  She has tolerated Prolia well.  Last injection was 12/15/2015.    Mrs. Astwood reports that she is moving in 2 days to [facility] to an assisted living facility - [facility].   She will change her provider when she gets settled.  Med(s) previously tried for Osteoporosis/Osteopenia:  Took Forteo for 2 years;  Also took Actonel and fosamax in past but had treatment failure / continued decrease in BMD.                                                             PMH: Age at menopause:  87's Hysterectomy?  No Oophorectomy?  No HRT? No Steroid Use?  No Thyroid med?  No History of cancer?  No History of digestive disorders (ie Crohn's)?  No Current or previous eating disorders?  No Last Vitamin D Result:  54 (03/08/216) Last GFR Result:  53 (03/08/2016)   FH/SH: Family history of osteoporosis?  No Parent with history of hip fracture?  No Family history of breast cancer?  No Exercise?  Yes - strength training twice a week and walking daily Smoking?  No Alcohol?  No    Calcium Assessment Calcium Intake  # of servings/day  Calcium mg  Milk (8 oz) 1  x  300  = 300mg   Yogurt (4 oz) 0 x  200 = 0  Cheese (1 oz) 0 x  200 = 0  Other Calcium sources   250mg   Ca supplement 500mg  bid = 1000mg    Estimated calcium intake per day 1450mg     DEXA Results Date of Test T-Score for AP Spine L1-L4 T-Score for Neck of Left Hip  07/03/2016 -1.6 -1.6  05/13/2014 -1.8 -1.8  02/14/2012 -2.0 -2.8  08/11/2009 -2.4 -2.3  05/13/2007 -2.2 -2.3    Lowest T-Score in  spine = -2.7 at L1-L4 (04/24/2005) Lowest T-Score in hip = -2.8 at neck of left hip (02/14/2012)  Assessment: Osteoporosis with improved BMD since starting Prolia  Recommendations: 1.  Prolia 60mg  SQ q 6 months- injection given today in office - recommend 1 more Prolia injection and then hold since she has been on since 2013 2.  continue calcium 1200mg  daily through supplementation or diet.  3.  continue weight bearing exercise - 30 minutes at least 4 days per week.   4.  Counseled and educated about fall risk and prevention.  Recheck DEXA:  2 years  Time spent counseling patient:  25 minutes  Cherre Robins, PharmD, CPP

## 2016-07-12 ENCOUNTER — Other Ambulatory Visit: Payer: Self-pay | Admitting: Family Medicine

## 2016-07-25 ENCOUNTER — Ambulatory Visit (INDEPENDENT_AMBULATORY_CARE_PROVIDER_SITE_OTHER): Payer: Medicare Other | Admitting: Family Medicine

## 2016-07-25 ENCOUNTER — Encounter: Payer: Self-pay | Admitting: Family Medicine

## 2016-07-25 VITALS — BP 149/85 | HR 60 | Temp 96.7°F | Ht 62.0 in | Wt 113.0 lb

## 2016-07-25 DIAGNOSIS — M810 Age-related osteoporosis without current pathological fracture: Secondary | ICD-10-CM | POA: Diagnosis not present

## 2016-07-25 DIAGNOSIS — I10 Essential (primary) hypertension: Secondary | ICD-10-CM | POA: Diagnosis not present

## 2016-07-25 DIAGNOSIS — E7800 Pure hypercholesterolemia, unspecified: Secondary | ICD-10-CM | POA: Diagnosis not present

## 2016-07-25 DIAGNOSIS — K219 Gastro-esophageal reflux disease without esophagitis: Secondary | ICD-10-CM

## 2016-07-25 DIAGNOSIS — D329 Benign neoplasm of meninges, unspecified: Secondary | ICD-10-CM

## 2016-07-25 DIAGNOSIS — Z1231 Encounter for screening mammogram for malignant neoplasm of breast: Secondary | ICD-10-CM

## 2016-07-25 DIAGNOSIS — E559 Vitamin D deficiency, unspecified: Secondary | ICD-10-CM

## 2016-07-25 DIAGNOSIS — Z1239 Encounter for other screening for malignant neoplasm of breast: Secondary | ICD-10-CM

## 2016-07-25 DIAGNOSIS — I2510 Atherosclerotic heart disease of native coronary artery without angina pectoris: Secondary | ICD-10-CM | POA: Diagnosis not present

## 2016-07-25 DIAGNOSIS — M67441 Ganglion, right hand: Secondary | ICD-10-CM | POA: Diagnosis not present

## 2016-07-25 NOTE — Addendum Note (Signed)
Addended by: Zannie Cove on: 07/25/2016 09:21 AM   Modules accepted: Orders

## 2016-07-25 NOTE — Patient Instructions (Addendum)
Medicare Annual Wellness Visit  [facility] and the medical providers at [facility] strive to bring you the best medical care.  In doing so we not only want to address your current medical conditions and concerns but also to detect new conditions early and prevent illness, disease and health-related problems.    Medicare offers a yearly Wellness Visit which allows our clinical staff to assess your need for preventative services including immunizations, lifestyle education, counseling to decrease risk of preventable diseases and screening for fall risk and other medical concerns.    This visit is provided free of charge (no copay) for all Medicare recipients. The clinical pharmacists at [facility] have begun to conduct these Wellness Visits which will also include a thorough review of all your medications.    As you primary medical provider recommend that you make an appointment for your Annual Wellness Visit if you have not done so already this year.  You may set up this appointment before you leave today or you may call back [phone] and schedule an appointment.  Please make sure when you call that you mention that you are scheduling your Annual Wellness Visit with the clinical pharmacist so that the appointment may be made for the proper length of time.     Continue current medications. Continue good therapeutic lifestyle changes which include good diet and exercise. Fall precautions discussed with patient. If an FOBT was given today- please return it to our front desk. If you are over 71 years old - you may need Prevnar 25 or the adult Pneumonia vaccine.  **Flu shots are available--- please call and schedule a FLU-CLINIC appointment**  After your visit with [place] today you will receive a survey in the mail or online from [employer] regarding your care with [place]. Please take a moment to fill this out. Your feedback is very  important to [place] as you can help [place] better understand your patient needs as well as improve your experience and satisfaction. WE CARE ABOUT YOU!!!   We will work on finding a new physician for you closer to where you live and if we don't know that information today we will try to get back in touch with you in the next few weeks and let you know who that physician is. You should continue to follow-up with your neurosurgeon and your cardiologist  Continue current treatment Check blood pressures at home and mail the readings to the office in 2-3 weeks so that if we need to readjust her medication we can Watch your sodium intake more closely

## 2016-07-25 NOTE — Progress Notes (Signed)
Subjective:    Patient ID: Yolanda [name], female    DOB: Jul 26, 1935, 81 y.o.   MRN: [id number]  HPI Pt here for follow up and management of chronic medical problems which includes hyperlipidemia and hypertension. She is taking medications regularly.Patient is doing well overall. She has recently moved to the friend's home in [place]. This patient is been known to me for many years. She has a history of coronary atherosclerosis hypertension GERD hyperlipidemia and recently a meningioma and this was successfully removed and she is continued to be monitored for this. She also has vitamin D deficiency. She also complains today of a small knot on her right thumb. She denies any chest pain or shortness of breath. She did switch off of proton X and on to ranitidine and had some heartburn with this but is now tolerating the ranitidine well and it is controlling her heartburn. She is not had any blood in the stool or any trouble with swallowing or loose bowel movements. She is passing her water well without problems. She would be interested in [place] finding a physician in the area of [facility] for her to continue to see.    Patient Active Problem List   Diagnosis Date Noted  . Meningioma ([place]) 05/13/2015  . Benign neoplasm of descending colon 10/22/2014  . Osteoporosis, postmenopausal 10/16/2013  . Allergic rhinitis 08/20/2013  . Vitamin D deficiency 08/20/2013  . GERD (gastroesophageal reflux disease) 08/20/2013  . THYROID NODULE 01/11/2009  . Hyperlipidemia 01/11/2009  . ANEMIA 01/11/2009  . Essential hypertension 01/11/2009  . Coronary atherosclerosis 01/11/2009  . CAROTID STENOSIS 01/11/2009   Outpatient Encounter Prescriptions as of 07/25/2016  Medication Sig  . acyclovir ointment (ZOVIRAX) 5 % Apply 1 application topically every 3 (three) hours. Apply frequently and sparingly to cold sores as needed.  Marland Kitchen aspirin EC 81 MG tablet Take 81 mg by mouth daily.  . Calcium-Vitamin D-Vitamin  K (CALCIUM + D) [phone] MG-UNT-MCG CHEW Chew 1 tablet by mouth daily.  . Cholecalciferol (VITAMIN D3) 1000 UNITS CAPS Take by mouth 2 (two) times daily.    . CO ENZYME Q-10 PO Take 1 capsule by mouth daily.   . colesevelam (WELCHOL) 625 MG tablet Take 3 tablets (1,875 mg total) by mouth 2 (two) times daily with a meal. Take one tablet by mouth three times a day  . denosumab (PROLIA) 60 MG/ML SOLN injection Inject 60 mg into the skin every 6 (six) months. Administer in upper arm, thigh, or abdomen  . fluticasone (FLONASE) 50 MCG/ACT nasal spray USE 1 TO 2 SPRAYS IN EACH  NOSTRIL DAILY AS DIRECTED  FOR ALLERGIC RHINITIS  . hydrochlorothiazide (MICROZIDE) 12.5 MG capsule TAKE 1 CAPSULE DAILY  . IRON PO Take 1 tablet by mouth every other day.   . Multiple Vitamin (MULTI-VITAMIN PO) Take 1 tablet by mouth daily.   Marland Kitchen omega-3 acid ethyl esters (LOVAZA) 1 g capsule TAKE 2 CAPSULES (=2GM)     TWICE DAILY  . pravastatin (PRAVACHOL) 80 MG tablet Take 1 tablet (80 mg total) by mouth daily.  . valACYclovir (VALTREX) 1000 MG tablet Take 1 tablet (1,000 mg total) by mouth daily. As directed (Patient not taking: Reported on 07/25/2016)  . [DISCONTINUED] pantoprazole (PROTONIX) 40 MG tablet TAKE 1 TABLET DAILY   No facility-administered encounter medications on file as of 07/25/2016.       Review of Systems  Constitutional: Negative.   HENT: Negative.   Eyes: Negative.   Respiratory: Negative.   Cardiovascular:  Negative.   Gastrointestinal: Negative.   Endocrine: Negative.   Genitourinary: Negative.   Musculoskeletal: Negative.   Skin: Negative.        Small knot - right thumb  Allergic/Immunologic: Negative.   Neurological: Negative.   Hematological: Negative.   Psychiatric/Behavioral: Negative.        Objective:   Physical Exam  Constitutional: She is oriented to person, place, and time. She appears well-developed and well-nourished.  Pleasant and alert  HENT:  Head: Normocephalic and  atraumatic.  Right Ear: External ear normal.  Left Ear: External ear normal.  Mouth/Throat: Oropharynx is clear and moist. No oropharyngeal exudate.  Slight nasal congestion and turbinate swelling bilaterally  Eyes: Conjunctivae and EOM are normal. Pupils are equal, round, and reactive to light. Right eye exhibits no discharge. Left eye exhibits no discharge. No scleral icterus.  Neck: Normal range of motion. Neck supple. No thyromegaly present.  No bruits audible today and no adenopathy  Cardiovascular: Normal rate, regular rhythm, normal heart sounds and intact distal pulses.   No murmur heard. Heart is 60/m with a regular rate and rhythm  Pulmonary/Chest: Effort normal and breath sounds normal. No respiratory distress. She has no wheezes. She has no rales.  Clear anteriorly and posteriorly  Abdominal: Soft. Bowel sounds are normal. She exhibits no mass. There is no tenderness. There is no rebound and no guarding.  Slight suprapubic tenderness and no liver or spleen enlargement and no bruits and no masses.  Musculoskeletal: Normal range of motion. She exhibits no edema.  Lymphadenopathy:    She has no cervical adenopathy.  Neurological: She is alert and oriented to person, place, and time. She has normal reflexes. No cranial nerve deficit.  Skin: Skin is warm and dry. No rash noted.  Psychiatric: She has a normal mood and affect. Her behavior is normal. Judgment and thought content normal.  Nursing note and vitals reviewed.  BP (!) 149/85 (BP Location: Left Arm)   Pulse 60   Temp (!) 96.7 F (35.9 C) (Oral)   Ht _0  (1.575 m)   Wt 113 lb (51.3 kg)   BMI 20.67 kg/m         Assessment & Plan:  1. Vitamin D deficiency -Continue current treatment pending results of lab work - CBC with Differential/Platelet - VITAMIN D 25 Hydroxy (Vit-D Deficiency, Fractures)  2. Essential hypertension -The blood pressure slightly elevated today and she is only taking HCTZ 12.5 mg daily. She  has not been checking her blood pressures at home. She will check more blood pressures and send a copy of those readings to [place] in a period of 3-4 weeks. - BMP8+EGFR - CBC with Differential/Platelet - Hepatic function panel  3. Gastroesophageal reflux disease, esophagitis presence not specified -No complaints with this today with taking Zantac. She will stay on this. - CBC with Differential/Platelet - Hepatic function panel  4. Pure hypercholesterolemia -Continue with current treatment and aggressive therapeutic lifestyle changes - CBC with Differential/Platelet - Lipid panel  5. Meningioma ([place]) -Continue to follow-up with neurosurgery  6. Osteoporosis, postmenopausal -Continue current treatment with vitamin D and calcium replacement and get DEXA scans regularly.  7. Ganglion cyst right thumb causing no pain and monitor this as needed and treat accordingly  No orders of the defined types were placed in this encounter.  Patient Instructions                       Medicare Annual Wellness Visit  [facility] and the medical providers at [facility] strive to bring you the best medical care.  In doing so we not only want to address your current medical conditions and concerns but also to detect new conditions early and prevent illness, disease and health-related problems.    Medicare offers a yearly Wellness Visit which allows our clinical staff to assess your need for preventative services including immunizations, lifestyle education, counseling to decrease risk of preventable diseases and screening for fall risk and other medical concerns.    This visit is provided free of charge (no copay) for all Medicare recipients. The clinical pharmacists at [facility] have begun to conduct these Wellness Visits which will also include a thorough review of all your medications.    As you primary medical provider recommend that you make an appointment  for your Annual Wellness Visit if you have not done so already this year.  You may set up this appointment before you leave today or you may call back [phone]) and schedule an appointment.  Please make sure when you call that you mention that you are scheduling your Annual Wellness Visit with the clinical pharmacist so that the appointment may be made for the proper length of time.     Continue current medications. Continue good therapeutic lifestyle changes which include good diet and exercise. Fall precautions discussed with patient. If an FOBT was given today- please return it to our front desk. If you are over 23 years old - you may need Prevnar 80 or the adult Pneumonia vaccine.  **Flu shots are available--- please call and schedule a FLU-CLINIC appointment**  After your visit with [place] today you will receive a survey in the mail or online from [employer] regarding your care with [place]. Please take a moment to fill this out. Your feedback is very important to [place] as you can help [place] better understand your patient needs as well as improve your experience and satisfaction. WE CARE ABOUT YOU!!!   We will work on finding a new physician for you closer to where you live and if we don't know that information today we will try to get back in touch with you in the next few weeks and let you know who that physician is. You should continue to follow-up with your neurosurgeon and your cardiologist  Continue current treatment Check blood pressures at home and mail the readings to the office in 2-3 weeks so that if we need to readjust her medication we can Watch your sodium intake more closely    Arrie Senate MD

## 2016-07-26 LAB — CBC WITH DIFFERENTIAL/PLATELET
BASOS: 0 %
Basophils Absolute: 0 10*3/uL (ref 0.0–0.2)
EOS (ABSOLUTE): 0.4 10*3/uL (ref 0.0–0.4)
EOS: 5 %
HEMATOCRIT: 42.3 % (ref 34.0–46.6)
Hemoglobin: 14.5 g/dL (ref 11.1–15.9)
IMMATURE GRANS (ABS): 0 10*3/uL (ref 0.0–0.1)
IMMATURE GRANULOCYTES: 0 %
LYMPHS: 40 %
Lymphocytes Absolute: 2.8 10*3/uL (ref 0.7–3.1)
MCH: 32 pg (ref 26.6–33.0)
MCHC: 34.3 g/dL (ref 31.5–35.7)
MCV: 93 fL (ref 79–97)
MONOCYTES: 12 %
MONOS ABS: 0.8 10*3/uL (ref 0.1–0.9)
NEUTROS PCT: 43 %
Neutrophils Absolute: 3.1 10*3/uL (ref 1.4–7.0)
Platelets: 201 10*3/uL (ref 150–379)
RBC: 4.53 x10E6/uL (ref 3.77–5.28)
RDW: 13.5 % (ref 12.3–15.4)
WBC: 7.1 10*3/uL (ref 3.4–10.8)

## 2016-07-26 LAB — BMP8+EGFR
BUN/Creatinine Ratio: 17 (ref 12–28)
BUN: 17 mg/dL (ref 8–27)
CALCIUM: 10.1 mg/dL (ref 8.7–10.3)
CO2: 28 mmol/L (ref 18–29)
CREATININE: 0.98 mg/dL (ref 0.57–1.00)
Chloride: 100 mmol/L (ref 96–106)
GFR calc Af Amer: 63 mL/min/{1.73_m2} (ref >59)
GFR, EST NON AFRICAN AMERICAN: 55 mL/min/{1.73_m2} — AB (ref >59)
GLUCOSE: 90 mg/dL (ref 65–99)
Potassium: 4.3 mmol/L (ref 3.5–5.2)
SODIUM: 142 mmol/L (ref 134–144)

## 2016-07-26 LAB — LIPID PANEL
CHOL/HDL RATIO: 2.1 ratio (ref 0.0–4.4)
Cholesterol, Total: 185 mg/dL (ref 100–199)
HDL: 90 mg/dL (ref >39)
LDL CALC: 80 mg/dL (ref 0–99)
TRIGLYCERIDES: 77 mg/dL (ref 0–149)
VLDL Cholesterol Cal: 15 mg/dL (ref 5–40)

## 2016-07-26 LAB — HEPATIC FUNCTION PANEL
ALBUMIN: 4.2 g/dL (ref 3.5–4.8)
ALT: 16 IU/L (ref 0–32)
AST: 26 IU/L (ref 0–40)
Alkaline Phosphatase: 42 IU/L (ref 39–117)
BILIRUBIN TOTAL: 0.5 mg/dL (ref 0.0–1.2)
BILIRUBIN, DIRECT: 0.15 mg/dL (ref 0.00–0.40)
Total Protein: 6.6 g/dL (ref 6.0–8.5)

## 2016-07-26 LAB — VITAMIN D 25 HYDROXY (VIT D DEFICIENCY, FRACTURES): Vit D, 25-Hydroxy: 42.9 ng/mL (ref 30.0–100.0)

## 2016-07-27 ENCOUNTER — Telehealth: Payer: Self-pay | Admitting: Family Medicine

## 2016-07-27 NOTE — Telephone Encounter (Signed)
Patient aware of results.

## 2016-07-31 ENCOUNTER — Other Ambulatory Visit: Payer: Self-pay | Admitting: Family Medicine

## 2016-09-21 ENCOUNTER — Other Ambulatory Visit: Payer: Self-pay | Admitting: Family Medicine

## 2016-09-21 DIAGNOSIS — Z1231 Encounter for screening mammogram for malignant neoplasm of breast: Secondary | ICD-10-CM

## 2016-10-17 ENCOUNTER — Other Ambulatory Visit: Payer: Self-pay | Admitting: Family Medicine

## 2016-10-23 ENCOUNTER — Ambulatory Visit
Admission: RE | Admit: 2016-10-23 | Discharge: 2016-10-23 | Disposition: A | Discharge status: Home / Self Care | Payer: Medicare Other | Source: Ambulatory Visit | Attending: Family Medicine | Admitting: Family Medicine

## 2016-10-23 DIAGNOSIS — Z1231 Encounter for screening mammogram for malignant neoplasm of breast: Secondary | ICD-10-CM

## 2016-12-20 ENCOUNTER — Encounter: Payer: Self-pay | Admitting: Family Medicine

## 2016-12-20 ENCOUNTER — Ambulatory Visit (INDEPENDENT_AMBULATORY_CARE_PROVIDER_SITE_OTHER): Payer: Medicare Other | Admitting: Family Medicine

## 2016-12-20 VITALS — BP 172/98 | HR 67 | Temp 96.8°F | Ht 62.0 in | Wt 115.0 lb

## 2016-12-20 DIAGNOSIS — M810 Age-related osteoporosis without current pathological fracture: Secondary | ICD-10-CM

## 2016-12-20 DIAGNOSIS — K5901 Slow transit constipation: Secondary | ICD-10-CM | POA: Diagnosis not present

## 2016-12-20 DIAGNOSIS — E7800 Pure hypercholesterolemia, unspecified: Secondary | ICD-10-CM | POA: Diagnosis not present

## 2016-12-20 DIAGNOSIS — K219 Gastro-esophageal reflux disease without esophagitis: Secondary | ICD-10-CM

## 2016-12-20 DIAGNOSIS — D329 Benign neoplasm of meninges, unspecified: Secondary | ICD-10-CM

## 2016-12-20 DIAGNOSIS — E559 Vitamin D deficiency, unspecified: Secondary | ICD-10-CM

## 2016-12-20 DIAGNOSIS — I10 Essential (primary) hypertension: Secondary | ICD-10-CM | POA: Diagnosis not present

## 2016-12-20 NOTE — Progress Notes (Signed)
Subjective:    Patient ID: Yolanda [name], female    DOB: April 16, 1935, 82 y.o.   MRN: [id number]  HPI Pt here for follow up and management of chronic medical problems which includes hyperlipidemia and hypertension. She is taking medication regularly.The patient is doing well overall. She is complaining with some constipation has some questions about per liter. Her blood pressure initially today was slightly elevated. She is due to return an FOBT get lab work. The patient did have a colonoscopy in 2016 and this was good. She denies any chest pain or shortness of breath. She denies any problems with indigestion and heartburn and nausea vomiting. She does have some blood in the stool secondary to the constipation and she only takes the MiraLAX as needed. She will try to take it more regularly. She says that she is drinking plenty of fluids.    Patient Active Problem List   Diagnosis Date Noted  . Meningioma ([facility]) 05/13/2015  . Benign neoplasm of descending colon 10/22/2014  . Osteoporosis, postmenopausal 10/16/2013  . Allergic rhinitis 08/20/2013  . Vitamin D deficiency 08/20/2013  . GERD (gastroesophageal reflux disease) 08/20/2013  . THYROID NODULE 01/11/2009  . Hyperlipidemia 01/11/2009  . ANEMIA 01/11/2009  . Essential hypertension 01/11/2009  . Coronary atherosclerosis 01/11/2009  . CAROTID STENOSIS 01/11/2009   Outpatient Encounter Prescriptions as of 12/20/2016  Medication Sig  . acyclovir ointment (ZOVIRAX) 5 % Apply 1 application topically every 3 (three) hours. Apply frequently and sparingly to cold sores as needed.  Marland Kitchen aspirin EC 81 MG tablet Take 81 mg by mouth daily.  . Calcium-Vitamin D-Vitamin K (CALCIUM + D) [phone] MG-UNT-MCG CHEW Chew 1 tablet by mouth daily.  . Cholecalciferol (VITAMIN D3) 1000 UNITS CAPS Take by mouth 2 (two) times daily.    . CO ENZYME Q-10 PO Take 1 capsule by mouth daily.   . colesevelam (WELCHOL) 625 MG tablet Take 3 tablets (1,875 mg total) by  mouth 2 (two) times daily with a meal. Take one tablet by mouth three times a day  . denosumab (PROLIA) 60 MG/ML SOLN injection Inject 60 mg into the skin every 6 (six) months. Administer in upper arm, thigh, or abdomen  . fluticasone (FLONASE) 50 MCG/ACT nasal spray USE 1 TO 2 SPRAYS IN EACH  NOSTRIL DAILY AS DIRECTED  FOR ALLERGIC RHINITIS  . hydrochlorothiazide (MICROZIDE) 12.5 MG capsule TAKE 1 CAPSULE DAILY  . IRON PO Take 1 tablet by mouth every other day.   . Multiple Vitamin (MULTI-VITAMIN PO) Take 1 tablet by mouth daily.   Marland Kitchen omega-3 acid ethyl esters (LOVAZA) 1 g capsule TAKE 2 CAPSULES (=2GM)     TWICE DAILY  . pravastatin (PRAVACHOL) 80 MG tablet TAKE 1 TABLET DAILY  . valACYclovir (VALTREX) 1000 MG tablet Take 1 tablet (1,000 mg total) by mouth daily. As directed  . [DISCONTINUED] pravastatin (PRAVACHOL) 80 MG tablet Take 1 tablet (80 mg total) by mouth daily.   No facility-administered encounter medications on file as of 12/20/2016.       Review of Systems  Constitutional: Negative.   HENT: Negative.   Eyes: Negative.   Respiratory: Negative.   Cardiovascular: Negative.   Gastrointestinal: Negative.   Endocrine: Negative.   Genitourinary: Negative.   Musculoskeletal: Negative.   Skin: Negative.   Allergic/Immunologic: Negative.   Neurological: Negative.   Hematological: Negative.   Psychiatric/Behavioral: Negative.        Objective:   Physical Exam  Constitutional: She is oriented to person, place, and  time. She appears well-developed and well-nourished. No distress.  The patient is small framed pleasant and alert. She is now living in [place] and is looking to transfer her care to live bowel primary [facility] [place] [employer].  HENT:  Head: Normocephalic and atraumatic.  Right Ear: External ear normal.  Left Ear: External ear normal.  Nose: Nose normal.  Mouth/Throat: Oropharynx is clear and moist. No oropharyngeal exudate.  Eyes: Conjunctivae and EOM are  normal. Pupils are equal, round, and reactive to light. Right eye exhibits no discharge. Left eye exhibits no discharge. No scleral icterus.  Neck: Normal range of motion. Neck supple. No thyromegaly present.  Cardiovascular: Normal rate, regular rhythm, normal heart sounds and intact distal pulses.   No murmur heard. The heart is regular at 72/m  Pulmonary/Chest: Effort normal and breath sounds normal. No respiratory distress. She has no wheezes. She has no rales.  Clear anteriorly and posteriorly  Abdominal: Soft. Bowel sounds are normal. She exhibits no mass. There is no tenderness. There is no rebound and no guarding.  No abdominal tenderness masses or bruits  Musculoskeletal: Normal range of motion. She exhibits no edema.  Lymphadenopathy:    She has no cervical adenopathy.  Neurological: She is alert and oriented to person, place, and time. She has normal reflexes. No cranial nerve deficit.  Skin: Skin is warm and dry. No rash noted.  Psychiatric: She has a normal mood and affect. Her behavior is normal. Judgment and thought content normal.  Nursing note and vitals reviewed.   BP (!) 155/73 (BP Location: Right Arm)   Pulse 67   Temp (!) 96.8 F (36 C) (Oral)   Ht _0  (1.575 m)   Wt 115 lb (52.2 kg)   BMI 21.03 kg/m        Assessment & Plan:  1. Vitamin D deficiency -Continue current treatment pending results of lab work - CBC with Differential/Platelet - VITAMIN D 25 Hydroxy (Vit-D Deficiency, Fractures)  2. Essential hypertension -The blood pressure was elevated today but the patient brings in multiple readings from home that are all within normal limits. Even this morning it was 138 over the 80 range at home before she left the office. -She will call us with some readings in a couple weeks and if it still was running high we will make adjustments to her medicines. - CBC with Differential/Platelet - BMP8+EGFR - Hepatic function panel  3. Gastroesophageal reflux  disease, esophagitis presence not specified -Continue current treatment and diet regimen - CBC with Differential/Platelet - Hepatic function panel  4. Pure hypercholesterolemia -Continue current treatment pending results of lab work - CBC with Differential/Platelet - NMR, lipoprofile  5. Meningioma [facility]) -Follow-up with neurosurgery for next MRI  6. Osteoporosis, postmenopausal -Continue with prolia  7. Slow transit constipation -Take MiraLAX more regularly and continue to drink plenty of fluids. Try brand name and see if this works any better than the generic.  Patient Instructions                       Medicare Annual Wellness Visit  [facility] and the medical providers at [facility] strive to bring you the best medical care.  In doing so we not only want to address your current medical conditions and concerns but also to detect new conditions early and prevent illness, disease and health-related problems.    Medicare offers a yearly Wellness Visit which allows our clinical staff to  assess your need for preventative services including immunizations, lifestyle education, counseling to decrease risk of preventable diseases and screening for fall risk and other medical concerns.    This visit is provided free of charge (no copay) for all Medicare recipients. The clinical pharmacists at [facility] have begun to conduct these Wellness Visits which will also include a thorough review of all your medications.    As you primary medical provider recommend that you make an appointment for your Annual Wellness Visit if you have not done so already this year.  You may set up this appointment before you leave today or you may call back ([phone]) and schedule an appointment.  Please make sure when you call that you mention that you are scheduling your Annual Wellness Visit with the clinical pharmacist so that the appointment may be made for the proper  length of time.    Continue current medications. Continue good therapeutic lifestyle changes which include good diet and exercise. Fall precautions discussed with patient. If an FOBT was given today- please return it to our front desk. If you are over 53 years old - you may need Prevnar 41 or the adult Pneumonia vaccine.  **Flu shots are available--- please call and schedule a FLU-CLINIC appointment**  After your visit with [place] today you will receive a survey in the mail or online from [employer] regarding your care with [place]. Please take a moment to fill this out. Your feedback is very important to [place] as you can help [place] better understand your patient needs as well as improve your experience and satisfaction. WE CARE ABOUT YOU!!!   The patient should continue with her prolia every 6 months She should follow-up with neurosurgeon and confirm with the hand when she needs to get her next MRI as they may need to coordinate a visit with her after getting the MRI She should check with Maryanna Shape on [place] for an appointment for her ongoing general care She should take her MiraLAX on a more regular basis and if this does not correct her constipation we may need to try a prescription treatment for this. She should continue to drink plenty of fluids and stay well hydrated  Arrie Senate MD

## 2016-12-20 NOTE — Patient Instructions (Addendum)
Medicare Annual Wellness Visit  [facility] and the medical providers at [facility] strive to bring you the best medical care.  In doing so we not only want to address your current medical conditions and concerns but also to detect new conditions early and prevent illness, disease and health-related problems.    Medicare offers a yearly Wellness Visit which allows our clinical staff to assess your need for preventative services including immunizations, lifestyle education, counseling to decrease risk of preventable diseases and screening for fall risk and other medical concerns.    This visit is provided free of charge (no copay) for all Medicare recipients. The clinical pharmacists at [facility] have begun to conduct these Wellness Visits which will also include a thorough review of all your medications.    As you primary medical provider recommend that you make an appointment for your Annual Wellness Visit if you have not done so already this year.  You may set up this appointment before you leave today or you may call back ([phone]) and schedule an appointment.  Please make sure when you call that you mention that you are scheduling your Annual Wellness Visit with the clinical pharmacist so that the appointment may be made for the proper length of time.    Continue current medications. Continue good therapeutic lifestyle changes which include good diet and exercise. Fall precautions discussed with patient. If an FOBT was given today- please return it to our front desk. If you are over 61 years old - you may need Prevnar 73 or the adult Pneumonia vaccine.  **Flu shots are available--- please call and schedule a FLU-CLINIC appointment**  After your visit with [place] today you will receive a survey in the mail or online from [employer] regarding your care with [place]. Please take a moment to fill this out. Your feedback is very  important to [place] as you can help [place] better understand your patient needs as well as improve your experience and satisfaction. WE CARE ABOUT YOU!!!   The patient should continue with her prolia every 6 months She should follow-up with neurosurgeon and confirm with the hand when she needs to get her next MRI as they may need to coordinate a visit with her after getting the MRI She should check with Maryanna Shape on [place] for an appointment for her ongoing general care She should take her MiraLAX on a more regular basis and if this does not correct her constipation we may need to try a prescription treatment for this. She should continue to drink plenty of fluids and stay well hydrated

## 2016-12-21 ENCOUNTER — Telehealth: Payer: Self-pay | Admitting: Family Medicine

## 2016-12-21 ENCOUNTER — Other Ambulatory Visit: Payer: Self-pay | Admitting: Family Medicine

## 2016-12-21 LAB — HEPATIC FUNCTION PANEL
ALBUMIN: 4.3 g/dL (ref 3.5–4.7)
ALT: 13 IU/L (ref 0–32)
AST: 26 IU/L (ref 0–40)
Alkaline Phosphatase: 35 IU/L — ABNORMAL LOW (ref 39–117)
BILIRUBIN TOTAL: 0.5 mg/dL (ref 0.0–1.2)
BILIRUBIN, DIRECT: 0.16 mg/dL (ref 0.00–0.40)
Total Protein: 6.3 g/dL (ref 6.0–8.5)

## 2016-12-21 LAB — NMR, LIPOPROFILE
CHOLESTEROL: 182 mg/dL (ref 100–199)
HDL Cholesterol by NMR: 89 mg/dL (ref >39)
HDL PARTICLE NUMBER: 41.6 umol/L (ref >=30.5)
LDL Particle Number: 597 nmol/L (ref <1000)
LDL SIZE: 20.9 nm (ref >20.5)
LDL-C: 82 mg/dL (ref 0–99)
TRIGLYCERIDES BY NMR: 57 mg/dL (ref 0–149)

## 2016-12-21 LAB — BMP8+EGFR
BUN/Creatinine Ratio: 16 (ref 12–28)
BUN: 14 mg/dL (ref 8–27)
CALCIUM: 9.4 mg/dL (ref 8.7–10.3)
CHLORIDE: 102 mmol/L (ref 96–106)
CO2: 27 mmol/L (ref 18–29)
Creatinine, Ser: 0.87 mg/dL (ref 0.57–1.00)
GFR, EST AFRICAN AMERICAN: 73 mL/min/{1.73_m2} (ref >59)
GFR, EST NON AFRICAN AMERICAN: 63 mL/min/{1.73_m2} (ref >59)
Glucose: 81 mg/dL (ref 65–99)
Potassium: 4 mmol/L (ref 3.5–5.2)
Sodium: 143 mmol/L (ref 134–144)

## 2016-12-21 LAB — CBC WITH DIFFERENTIAL/PLATELET
BASOS: 0 %
Basophils Absolute: 0 10*3/uL (ref 0.0–0.2)
EOS (ABSOLUTE): 0.3 10*3/uL (ref 0.0–0.4)
EOS: 5 %
HEMATOCRIT: 44 % (ref 34.0–46.6)
Hemoglobin: 14.7 g/dL (ref 11.1–15.9)
IMMATURE GRANS (ABS): 0 10*3/uL (ref 0.0–0.1)
IMMATURE GRANULOCYTES: 0 %
LYMPHS ABS: 2.4 10*3/uL (ref 0.7–3.1)
LYMPHS: 35 %
MCH: 32.2 pg (ref 26.6–33.0)
MCHC: 33.4 g/dL (ref 31.5–35.7)
MCV: 97 fL (ref 79–97)
MONOS ABS: 0.5 10*3/uL (ref 0.1–0.9)
Monocytes: 8 %
Neutrophils Absolute: 3.6 10*3/uL (ref 1.4–7.0)
Neutrophils: 52 %
Platelets: 175 10*3/uL (ref 150–379)
RBC: 4.56 x10E6/uL (ref 3.77–5.28)
RDW: 13 % (ref 12.3–15.4)
WBC: 6.9 10*3/uL (ref 3.4–10.8)

## 2016-12-21 LAB — VITAMIN D 25 HYDROXY (VIT D DEFICIENCY, FRACTURES): VIT D 25 HYDROXY: 45 ng/mL (ref 30.0–100.0)

## 2016-12-21 NOTE — Telephone Encounter (Signed)
Pt aware of results 

## 2016-12-26 DIAGNOSIS — D329 Benign neoplasm of meninges, unspecified: Secondary | ICD-10-CM | POA: Diagnosis not present

## 2016-12-27 ENCOUNTER — Encounter: Payer: Medicare Other | Admitting: *Deleted

## 2016-12-28 DIAGNOSIS — D329 Benign neoplasm of meninges, unspecified: Secondary | ICD-10-CM | POA: Diagnosis not present

## 2016-12-28 DIAGNOSIS — Z0389 Encounter for observation for other suspected diseases and conditions ruled out: Secondary | ICD-10-CM | POA: Diagnosis not present

## 2017-01-04 ENCOUNTER — Telehealth: Payer: Self-pay | Admitting: Pharmacist

## 2017-01-04 NOTE — Telephone Encounter (Signed)
Received insurance verification that prolia is approved and zero cost to patient.  She lives in [place] now so wanted to make coming to office as convenient as possible.  She is seeing Dr Jenkins Rouge 01/31/17 in [place] - we can try to do appt then.

## 2017-01-04 NOTE — Telephone Encounter (Signed)
-----   Message from Zannie Cove, LPN sent at 6/83/4196 11:46 AM EDT ----- Please check on the prolia for this pt

## 2017-01-05 MED ORDER — COLESEVELAM HCL 625 MG PO TABS: 1875.0000 mg | ORAL_TABLET | Freq: Two times a day (BID) | ORAL | 3 refills | Status: DC

## 2017-01-05 NOTE — Telephone Encounter (Signed)
No change in blood pressure medication treatment.

## 2017-01-05 NOTE — Telephone Encounter (Signed)
Called Yolanda [name] to schedule appt for prolia injection.  Appt made for 01/31/17 after she sees Dr Jenkins Rouge at [place] office.    Home BP readings:  140/85 120/90 132/86 109/77 110/71  HR is usually 62 to 74 Patient asked that I forward BP and HR reading to Dr Laurance Flatten

## 2017-01-10 ENCOUNTER — Other Ambulatory Visit: Payer: Self-pay | Admitting: Pharmacist

## 2017-01-10 MED ORDER — COLESEVELAM HCL 625 MG PO TABS: 1875.0000 mg | ORAL_TABLET | Freq: Two times a day (BID) | ORAL | 3 refills | Status: DC

## 2017-01-12 ENCOUNTER — Encounter: Payer: Self-pay | Admitting: Cardiology

## 2017-01-29 DIAGNOSIS — I10 Essential (primary) hypertension: Secondary | ICD-10-CM | POA: Diagnosis not present

## 2017-01-29 DIAGNOSIS — D329 Benign neoplasm of meninges, unspecified: Secondary | ICD-10-CM | POA: Diagnosis not present

## 2017-01-30 NOTE — Progress Notes (Signed)
HPI The patient presents for follow up of CAD.  Since I last saw her she had resection of a meningioma.   The patient denies any new symptoms such as chest discomfort, neck or arm discomfort. There has been no new shortness of breath, PND or orthopnea. There have been no reported palpitations, presyncope or syncope.  She has moved to [facility] and is doing exercises several days per week.    Allergies  Allergen Reactions  . Sulfonamide Derivatives   . K+ Care [Potassium Chloride] Diarrhea  . Lipitor [Atorvastatin Calcium]     Myalgias    . Sulfa Antibiotics Rash    Fever blisters    Current Outpatient Prescriptions  Medication Sig Dispense Refill  . aspirin EC 81 MG tablet Take 81 mg by mouth daily.    . Calcium-Vitamin D-Vitamin K (CALCIUM + D) [phone] MG-UNT-MCG CHEW Chew 1 tablet by mouth daily.    . Cholecalciferol (VITAMIN D3) 1000 UNITS CAPS Take by mouth 2 (two) times daily.      . CO ENZYME Q-10 PO Take 1 capsule by mouth daily.     . colesevelam (WELCHOL) 625 MG tablet Take 3 tablets (1,875 mg total) by mouth 2 (two) times daily with a meal. 540 tablet 3  . denosumab (PROLIA) 60 MG/ML SOLN injection Inject 60 mg into the skin every 6 (six) months. Administer in upper arm, thigh, or abdomen    . fluticasone (FLONASE) 50 MCG/ACT nasal spray USE 1 TO 2 SPRAYS IN EACH  NOSTRIL DAILY AS DIRECTED  FOR ALLERGIC RHINITIS 48 g 1  . hydrochlorothiazide (MICROZIDE) 12.5 MG capsule Take 12.5 mg by mouth daily.    . IRON PO Take 1 tablet by mouth every other day.     Marland Kitchen L-Lysine 1000 MG TABS Take 1,000 mg by mouth daily.    . Multiple Vitamin (MULTI-VITAMIN PO) Take 1 tablet by mouth daily.     Marland Kitchen omega-3 acid ethyl esters (LOVAZA) 1 g capsule Take 2 g by mouth 2 (two) times daily.    . pravastatin (PRAVACHOL) 80 MG tablet Take 80 mg by mouth daily.    Marland Kitchen acyclovir ointment (ZOVIRAX) 5 % Apply 1 application topically every 3 (three) hours. Apply frequently and sparingly to cold  sores as needed. 90 g 3  . valACYclovir (VALTREX) 1000 MG tablet Take 1 tablet (1,000 mg total) by mouth daily. As directed (Patient not taking: Reported on 01/31/2017) 90 tablet 0   No current facility-administered medications for this visit.     Past Medical History:  Diagnosis Date  . Allergy    seasonal  . Anemia   . Arthritis   . Benign neoplasm of descending colon 10/22/2014  . CAD (coronary artery disease)    LAD lession   . Cancer ([facility])    skin  . Cataract    removed bilaterally  . Diverticulosis 11/00  . Esophagitis, acute   . Fuch's endothelial dystrophy 04/2009   Dr, Shanon Rosser   . Gastritis 11/00  . GERD (gastroesophageal reflux disease) 11/00  . Headache   . Hemorrhoids 8/05  . Hyperlipidemia   . Hypertension   . Lung nodule 1/09  . NSVD (normal spontaneous vaginal delivery) 1967   female   . Osteopenia   . Osteoporosis   . Thyroid nodule 8/08    Past Surgical History:  Procedure Laterality Date  . bilateral cataract surgery  06/2007   DR. Groat   . bilateral cataract surgery  10/08  Dr. Katy Fitch   . cardiac cath 40-50%LAD  6/07  . cardiac cath with angioplasty & stent replacement  4/98  . COLONOSCOPY    . CRANIOTOMY Left 05/13/2015   Procedure: Left frontotemporal Craniotomy for Meningioma resection;  Surgeon: Consuella Lose, MD;  Location: [facility];  Service: Neurosurgery;  Laterality: Left;  . CT LUNG SCREENING  6/07  . FACIAL COSMETIC SURGERY  7/96   Dr. Mikle Bosworth    . MENISCUS REPAIR      ROS:     Otherwise as stated in the HPI and negative for all other systems.  PHYSICAL EXAM BP 120/60   Pulse 80   Ht 5\' 2"  (1.575 m)   Wt 115 lb (52.2 kg)   BMI 21.03 kg/m  GENERAL:  Well appearing NECK:  No jugular venous distention, waveform within normal limits, carotid upstroke brisk and symmetric, no bruits, no thyromegaly LUNGS:  Clear to auscultation bilaterally BACK:  No CVA tenderness HEART:  PMI not displaced or sustained,S1 and S2 within normal  limits, no S3, no S4, no clicks, no rubs, no murmurs ABD:  Flat, positive bowel sounds normal in frequency in pitch, no bruits, no rebound, no guarding, no midline pulsatile mass, no hepatomegaly, no splenomegaly EXT:  2 plus pulses throughout, no edema, no cyanosis no clubbing   EKG:  Sinus rhythm, rate 80, axis within normal limits, intervals within normal limits, non specific inferior ST T wave changes, no change. 01/31/2017  Lab Results  Component Value Date   CHOL 182 12/20/2016   TRIG 57 12/20/2016   HDL 89 12/20/2016   LDLCALC 80 07/25/2016   ASSESSMENT AND PLAN  CAD, UNSPECIFIED SITE -  She has had  no new symptoms since her stress perfusion study in 2013.  No change in therapy is indicated.   She will continue with risk reduction.   CAROTID STENOSIS -  She had 0-39% right stenosis and 40-59% left stenosis.  I will order follow up.   HYPERTENSION, UNSPECIFIED -  The blood pressure is at target and she will remain on meds as listed.   HYPERLIPIDEMIA-MIXED -  This is followed closely by  Redge Gainer, MD.  Most recent LDL was 42 in March (not listed above.)   She will remain on the meds as listed.

## 2017-01-31 ENCOUNTER — Ambulatory Visit (INDEPENDENT_AMBULATORY_CARE_PROVIDER_SITE_OTHER): Payer: Medicare Other | Admitting: Cardiology

## 2017-01-31 ENCOUNTER — Ambulatory Visit (INDEPENDENT_AMBULATORY_CARE_PROVIDER_SITE_OTHER): Payer: Medicare Other | Admitting: Pharmacist

## 2017-01-31 ENCOUNTER — Encounter: Payer: Self-pay | Admitting: Cardiology

## 2017-01-31 VITALS — BP 120/60 | HR 80 | Ht 62.0 in | Wt 115.0 lb

## 2017-01-31 DIAGNOSIS — M810 Age-related osteoporosis without current pathological fracture: Secondary | ICD-10-CM | POA: Diagnosis not present

## 2017-01-31 DIAGNOSIS — I10 Essential (primary) hypertension: Secondary | ICD-10-CM

## 2017-01-31 DIAGNOSIS — I6523 Occlusion and stenosis of bilateral carotid arteries: Secondary | ICD-10-CM

## 2017-01-31 DIAGNOSIS — E785 Hyperlipidemia, unspecified: Secondary | ICD-10-CM

## 2017-01-31 DIAGNOSIS — I2510 Atherosclerotic heart disease of native coronary artery without angina pectoris: Secondary | ICD-10-CM | POA: Insufficient documentation

## 2017-01-31 MED ORDER — DENOSUMAB 60 MG/ML ~~LOC~~ SOLN
60.0000 mg | Freq: Once | SUBCUTANEOUS | Status: AC
  Administered 2017-01-31: 60 mg via SUBCUTANEOUS

## 2017-01-31 NOTE — Progress Notes (Signed)
   HPI: Mrs. Morganti is referred by her PCP for treatment of osteoporosis.  She is currently taking Prolia 60mg  SQ since 04/2012 except for 1 year when she held due to concerns that Prolia was causing cholesterol to increase.   Last Prolia injection was administered  07/03/2016.    Mrs Shackleford has moved to [facility] to [facility] which is an assisted living facility.  She is adjusting well and enjoying living there.   She has plans to change her PCP in the next 2-3 months.  Med(s) previously tried for Osteoporosis/Osteopenia:  Took Forteo for 2 years;  Also took Actonel and fosamax in past but had treatment failure / continued decrease in BMD.                                                             PMH: Age at menopause:  38's Hysterectomy?  No Oophorectomy?  No HRT? No Steroid Use?  No Thyroid med?  No History of cancer?  No History of digestive disorders (ie Crohn's)?  No Current or previous eating disorders?  No Last Vitamin D Result: 45 (12/20/2016)   Last GFR Result:  63 (12/21/2106)   FH/SH: Family history of osteoporosis?  No Parent with history of hip fracture?  No Family history of breast cancer?  No Exercise?  Yes - strength training twice a week and walking daily Smoking?  No Alcohol?  No    Calcium Assessment Calcium Intake  # of servings/day  Calcium mg  Milk (8 oz) 0.5  x  300  = 150mg   Yogurt (4 oz) 0 x  200 = 0  Cheese (1 oz) 0 x  200 = 0  Other Calcium sources   250mg   Ca supplement 500mg  bid = 1000mg    Estimated calcium intake per day 1400mg     DEXA Results Date of Test T-Score for AP Spine L1-L4 T-Score for Neck of Left Hip  07/03/2016 -1.6 -1.6  05/13/2014 -1.8 -1.8  02/14/2012 -2.0 -2.8  08/11/2009 -2.4 -2.3  05/13/2007 -2.2 -2.3    Lowest T-Score in spine = -2.7 at L1-L4 (04/24/2005) Lowest T-Score in hip = -2.8 at neck of left hip (02/14/2012)  Assessment: Osteoporosis with improved BMD since starting  Prolia  Recommendations: 1.  Prolia 60mg  SQ q 6 months - administered in office today 2.  continue calcium 1200mg  daily through supplementation or diet.  3.  continue weight bearing exercise - 30 minutes at least 4 days per week.   4.  Counseled about fall risk and prevention.  Recheck DEXA:  07/2018  Time spent counseling patient:  15 minutes  Cherre Robins, PharmD, CPP

## 2017-01-31 NOTE — Patient Instructions (Signed)
Medication Instructions:  The current medical regimen is effective;  continue present plan and medications.  Testing/Procedures: Your physician has requested that you have a carotid duplex. This test is an ultrasound of the carotid arteries in your neck. It looks at blood flow through these arteries that supply the brain with blood. Allow one hour for this exam. There are no restrictions or special instructions.  Follow-Up: Follow up in 1 year with Dr. Percival Spanish in [place].  You will receive a letter in the mail 2 months before you are due.  Please call us when you receive this letter to schedule your follow up appointment.  If you need a refill on your cardiac medications before your next appointment, please call your pharmacy.  Thank you for choosing [facility]!!

## 2017-02-08 ENCOUNTER — Other Ambulatory Visit: Payer: Self-pay | Admitting: Family Medicine

## 2017-02-27 ENCOUNTER — Ambulatory Visit (HOSPITAL_COMMUNITY)
Admission: RE | Admit: 2017-02-27 | Discharge: 2017-02-27 | Disposition: A | Discharge status: Home / Self Care | Payer: Medicare Other | Source: Ambulatory Visit | Attending: Cardiovascular Disease | Admitting: Cardiovascular Disease

## 2017-02-27 DIAGNOSIS — I6523 Occlusion and stenosis of bilateral carotid arteries: Secondary | ICD-10-CM | POA: Diagnosis not present

## 2017-02-27 DIAGNOSIS — Z87898 Personal history of other specified conditions: Secondary | ICD-10-CM | POA: Diagnosis not present

## 2017-02-27 DIAGNOSIS — E785 Hyperlipidemia, unspecified: Secondary | ICD-10-CM | POA: Diagnosis not present

## 2017-02-27 DIAGNOSIS — I2510 Atherosclerotic heart disease of native coronary artery without angina pectoris: Secondary | ICD-10-CM | POA: Diagnosis not present

## 2017-02-27 DIAGNOSIS — I10 Essential (primary) hypertension: Secondary | ICD-10-CM | POA: Insufficient documentation

## 2017-04-12 DIAGNOSIS — H1859 Other hereditary corneal dystrophies: Secondary | ICD-10-CM | POA: Diagnosis not present

## 2017-04-12 DIAGNOSIS — H1851 Endothelial corneal dystrophy: Secondary | ICD-10-CM | POA: Diagnosis not present

## 2017-04-12 DIAGNOSIS — H04123 Dry eye syndrome of bilateral lacrimal glands: Secondary | ICD-10-CM | POA: Diagnosis not present

## 2017-04-12 DIAGNOSIS — Z961 Presence of intraocular lens: Secondary | ICD-10-CM | POA: Diagnosis not present

## 2017-04-12 DIAGNOSIS — H469 Unspecified optic neuritis: Secondary | ICD-10-CM | POA: Diagnosis not present

## 2017-04-12 DIAGNOSIS — D3132 Benign neoplasm of left choroid: Secondary | ICD-10-CM | POA: Diagnosis not present

## 2017-05-02 ENCOUNTER — Other Ambulatory Visit: Payer: Self-pay | Admitting: Family Medicine

## 2017-05-02 ENCOUNTER — Ambulatory Visit (INDEPENDENT_AMBULATORY_CARE_PROVIDER_SITE_OTHER): Payer: Medicare Other | Admitting: Family Medicine

## 2017-05-02 ENCOUNTER — Ambulatory Visit (INDEPENDENT_AMBULATORY_CARE_PROVIDER_SITE_OTHER): Payer: Medicare Other

## 2017-05-02 ENCOUNTER — Encounter: Payer: Self-pay | Admitting: Family Medicine

## 2017-05-02 VITALS — BP 172/90 | HR 63 | Temp 97.1°F | Ht 62.0 in | Wt 116.0 lb

## 2017-05-02 DIAGNOSIS — I6523 Occlusion and stenosis of bilateral carotid arteries: Secondary | ICD-10-CM | POA: Diagnosis not present

## 2017-05-02 DIAGNOSIS — E559 Vitamin D deficiency, unspecified: Secondary | ICD-10-CM | POA: Diagnosis not present

## 2017-05-02 DIAGNOSIS — K219 Gastro-esophageal reflux disease without esophagitis: Secondary | ICD-10-CM

## 2017-05-02 DIAGNOSIS — I10 Essential (primary) hypertension: Secondary | ICD-10-CM

## 2017-05-02 DIAGNOSIS — E7800 Pure hypercholesterolemia, unspecified: Secondary | ICD-10-CM

## 2017-05-02 NOTE — Patient Instructions (Addendum)
Medicare Annual Wellness Visit  [facility] and the medical providers at [facility] strive to bring you the best medical care.  In doing so we not only want to address your current medical conditions and concerns but also to detect new conditions early and prevent illness, disease and health-related problems.    Medicare offers a yearly Wellness Visit which allows our clinical staff to assess your need for preventative services including immunizations, lifestyle education, counseling to decrease risk of preventable diseases and screening for fall risk and other medical concerns.    This visit is provided free of charge (no copay) for all Medicare recipients. The clinical pharmacists at [facility] have begun to conduct these Wellness Visits which will also include a thorough review of all your medications.    As you primary medical provider recommend that you make an appointment for your Annual Wellness Visit if you have not done so already this year.  You may set up this appointment before you leave today or you may call back ([phone]) and schedule an appointment.  Please make sure when you call that you mention that you are scheduling your Annual Wellness Visit with the clinical pharmacist so that the appointment may be made for the proper length of time.     Continue current medications. Continue good therapeutic lifestyle changes which include good diet and exercise. Fall precautions discussed with patient. If an FOBT was given today- please return it to our front desk. If you are over 63 years old - you may need Prevnar 66 or the adult Pneumonia vaccine.  **Flu shots are available--- please call and schedule a FLU-CLINIC appointment**  After your visit with [place] today you will receive a survey in the mail or online from [employer] regarding your care with [place]. Please take a moment to fill this out. Your feedback is very  important to [place] as you can help [place] better understand your patient needs as well as improve your experience and satisfaction. WE CARE ABOUT YOU!!!   Continue to walk and exercise regularly Monitor blood pressures at home and watch sodium intake

## 2017-05-02 NOTE — Progress Notes (Signed)
Subjective:    Patient ID: Yolanda [name], female    DOB: Oct 20, 1934, 82 y.o.   MRN: [id number]  HPI Pt here for follow up and management of chronic medical problems which includes hypertension and hyperlipidemia. He is taking medication regularly.This patient has moved to a retirement center in [place]. We're going to recommend Dr. Garret Reddish to follow-up with he will be at the office on [address] [employer]. He is currently at [facility]. She has no complaints today she will get a chest x-ray will be given an FOBT to return and will be given lab work. She refuses to do a Pap smear. She had her mammogram done in January and a DEXA scan done in October 2017. The patient denies any chest pain shortness of breath trouble swallowing heartburn indigestion nausea vomiting diarrhea or blood in the stool. She is passing her water well. In the retirement home that she is living in the have graduated care and there is actually a physician that comes and takes care of patients there and she is considering even trying this physician. She understands that if she decides to see them that we will be happy that she find someone closer by 6 she doesn't have to drive back and forth to [place]. The blood pressure at home today was 124/83 and she usually checks this regularly. She says when she comes to the doctor's office it is all ways elevated. When this was repeated today the blood pressure was 172/90 in the right arm sitting with a regular cuff   Patient Active Problem List   Diagnosis Date Noted  . Bilateral carotid artery stenosis 01/31/2017  . Coronary artery disease involving native coronary artery of native heart without angina pectoris 01/31/2017  . Dyslipidemia 01/31/2017  . Meningioma ([place]) 05/13/2015  . Benign neoplasm of descending colon 10/22/2014  . Osteoporosis, postmenopausal 10/16/2013  . Allergic rhinitis 08/20/2013  . Vitamin D deficiency 08/20/2013  . GERD (gastroesophageal reflux  disease) 08/20/2013  . THYROID NODULE 01/11/2009  . Hyperlipidemia 01/11/2009  . ANEMIA 01/11/2009  . Essential hypertension 01/11/2009  . Coronary atherosclerosis 01/11/2009  . CAROTID STENOSIS 01/11/2009   Outpatient Encounter Prescriptions as of 05/02/2017  Medication Sig  . acyclovir ointment (ZOVIRAX) 5 % Apply 1 application topically every 3 (three) hours. Apply frequently and sparingly to cold sores as needed.  Marland Kitchen aspirin EC 81 MG tablet Take 81 mg by mouth daily.  . Calcium-Vitamin D-Vitamin K (CALCIUM + D) [phone] MG-UNT-MCG CHEW Chew 1 tablet by mouth daily.  . Cholecalciferol (VITAMIN D3) 1000 UNITS CAPS Take by mouth 2 (two) times daily.    . CO ENZYME Q-10 PO Take 1 capsule by mouth daily.   . colesevelam (WELCHOL) 625 MG tablet Take 3 tablets (1,875 mg total) by mouth 2 (two) times daily with a meal.  . denosumab (PROLIA) 60 MG/ML SOLN injection Inject 60 mg into the skin every 6 (six) months. Administer in upper arm, thigh, or abdomen  . fluticasone (FLONASE) 50 MCG/ACT nasal spray USE 1 TO 2 SPRAYS IN EACH  NOSTRIL DAILY AS DIRECTED  FOR ALLERGIC RHINITIS  . hydrochlorothiazide (MICROZIDE) 12.5 MG capsule Take 12.5 mg by mouth daily.  . IRON PO Take 1 tablet by mouth every other day.   Marland Kitchen L-Lysine 1000 MG TABS Take 1,000 mg by mouth daily.  . Multiple Vitamin (MULTI-VITAMIN PO) Take 1 tablet by mouth daily.   Marland Kitchen omega-3 acid ethyl esters (LOVAZA) 1 g capsule Take 2 g by  mouth 2 (two) times daily.  . pravastatin (PRAVACHOL) 80 MG tablet Take 80 mg by mouth daily.  . valACYclovir (VALTREX) 1000 MG tablet Take 1 tablet (1,000 mg total) by mouth daily. As directed   No facility-administered encounter medications on file as of 05/02/2017.      Review of Systems  Constitutional: Negative.   HENT: Negative.   Eyes: Negative.   Respiratory: Negative.   Cardiovascular: Negative.   Gastrointestinal: Negative.   Endocrine: Negative.   Genitourinary: Negative.     Musculoskeletal: Negative.   Skin: Negative.   Allergic/Immunologic: Negative.   Neurological: Negative.   Hematological: Negative.   Psychiatric/Behavioral: Negative.        Objective:   Physical Exam  Constitutional: She is oriented to person, place, and time. She appears well-developed and well-nourished. No distress.  The patient is pleasant and alert and has a positive attitude. She's always been a good patient and try to do everything we've asked her to do.  HENT:  Head: Normocephalic and atraumatic.  Right Ear: External ear normal.  Left Ear: External ear normal.  Mouth/Throat: Oropharynx is clear and moist. No oropharyngeal exudate.  Slight nasal congestion and she is using her Flonase regularly.  Eyes: Pupils are equal, round, and reactive to light. Conjunctivae and EOM are normal. Right eye exhibits no discharge. Left eye exhibits no discharge. No scleral icterus.  Neck: Normal range of motion. Neck supple. No thyromegaly present.  No bruits thyromegaly or anterior cervical adenopathy  Cardiovascular: Normal rate, regular rhythm, normal heart sounds and intact distal pulses.   No murmur heard. Heart has a regular rate and rhythm at 72/m  Pulmonary/Chest: Effort normal and breath sounds normal. No respiratory distress. She has no wheezes. She has no rales.  Clear anteriorly and posteriorly  Abdominal: Soft. Bowel sounds are normal. She exhibits no mass. There is no tenderness. There is no rebound and no guarding.  No liver or spleen enlargement and no masses. Normal bowel sounds.  Musculoskeletal: Normal range of motion. She exhibits no edema.  Lymphadenopathy:    She has no cervical adenopathy.  Neurological: She is alert and oriented to person, place, and time. She has normal reflexes. No cranial nerve deficit.  Skin: Skin is warm and dry. No rash noted.  Psychiatric: She has a normal mood and affect. Her behavior is normal. Judgment and thought content normal.   Nursing note and vitals reviewed.  BP (!) 176/85 (BP Location: Left Arm)   Pulse 63   Temp (!) 97.1 F (36.2 C) (Oral)   Ht 5' 2" (1.575 m)   Wt 116 lb (52.6 kg)   BMI 21.22 kg/m    Chest x-ray with results pending     Assessment & Plan:  1. Vitamin D deficiency -Continue vitamin D and calcium replacement - CBC with Differential/Platelet - VITAMIN D 25 Hydroxy (Vit-D Deficiency, Fractures)  2. Essential hypertension -The blood pressure was elevated on 2 occasions today. She says at home is always good and we will trust those readings and not make any changes in her blood pressure medicine. We will ask her to continue to watch her salt intake. - CBC with Differential/Platelet - BMP8+EGFR - Hepatic function panel - DG Chest 2 View; Future  3. Gastroesophageal reflux disease, esophagitis presence not specified -No complaints with reflux today. She will continue with watching her diet closely and continue with her WelChol. - CBC with Differential/Platelet - Hepatic function panel  4. Pure hypercholesterolemia -Continue with WelChol and  therapeutic lifestyle changes - CBC with Differential/Platelet - Lipid panel - DG Chest 2 View; Future  Patient Instructions                       Medicare Annual Wellness Visit  [facility] and the medical providers at [facility] strive to bring you the best medical care.  In doing so we not only want to address your current medical conditions and concerns but also to detect new conditions early and prevent illness, disease and health-related problems.    Medicare offers a yearly Wellness Visit which allows our clinical staff to assess your need for preventative services including immunizations, lifestyle education, counseling to decrease risk of preventable diseases and screening for fall risk and other medical concerns.    This visit is provided free of charge (no copay) for all Medicare recipients. The clinical  pharmacists at [facility] have begun to conduct these Wellness Visits which will also include a thorough review of all your medications.    As you primary medical provider recommend that you make an appointment for your Annual Wellness Visit if you have not done so already this year.  You may set up this appointment before you leave today or you may call back ([phone]) and schedule an appointment.  Please make sure when you call that you mention that you are scheduling your Annual Wellness Visit with the clinical pharmacist so that the appointment may be made for the proper length of time.     Continue current medications. Continue good therapeutic lifestyle changes which include good diet and exercise. Fall precautions discussed with patient. If an FOBT was given today- please return it to our front desk. If you are over 34 years old - you may need Prevnar 83 or the adult Pneumonia vaccine.  **Flu shots are available--- please call and schedule a FLU-CLINIC appointment**  After your visit with [place] today you will receive a survey in the mail or online from [employer] regarding your care with [place]. Please take a moment to fill this out. Your feedback is very important to [place] as you can help [place] better understand your patient needs as well as improve your experience and satisfaction. WE CARE ABOUT YOU!!!   Continue to walk and exercise regularly Monitor blood pressures at home and watch sodium intake    Arrie Senate MD

## 2017-05-03 LAB — BMP8+EGFR
BUN/Creatinine Ratio: 15 (ref 12–28)
BUN: 15 mg/dL (ref 8–27)
CALCIUM: 9.7 mg/dL (ref 8.7–10.3)
CHLORIDE: 101 mmol/L (ref 96–106)
CO2: 27 mmol/L (ref 20–29)
Creatinine, Ser: 0.97 mg/dL (ref 0.57–1.00)
GFR calc Af Amer: 64 mL/min/{1.73_m2} (ref >59)
GFR calc non Af Amer: 55 mL/min/{1.73_m2} — ABNORMAL LOW (ref >59)
GLUCOSE: 87 mg/dL (ref 65–99)
POTASSIUM: 4.3 mmol/L (ref 3.5–5.2)
Sodium: 142 mmol/L (ref 134–144)

## 2017-05-03 LAB — LIPID PANEL
CHOL/HDL RATIO: 1.9 ratio (ref 0.0–4.4)
Cholesterol, Total: 178 mg/dL (ref 100–199)
HDL: 94 mg/dL (ref >39)
LDL CALC: 73 mg/dL (ref 0–99)
TRIGLYCERIDES: 57 mg/dL (ref 0–149)
VLDL Cholesterol Cal: 11 mg/dL (ref 5–40)

## 2017-05-03 LAB — CBC WITH DIFFERENTIAL/PLATELET
Basophils Absolute: 0 10*3/uL (ref 0.0–0.2)
Basos: 0 %
EOS (ABSOLUTE): 0.2 10*3/uL (ref 0.0–0.4)
EOS: 3 %
HEMATOCRIT: 43.8 % (ref 34.0–46.6)
Hemoglobin: 14.7 g/dL (ref 11.1–15.9)
IMMATURE GRANULOCYTES: 0 %
Immature Grans (Abs): 0 10*3/uL (ref 0.0–0.1)
Lymphocytes Absolute: 2.7 10*3/uL (ref 0.7–3.1)
Lymphs: 41 %
MCH: 31.3 pg (ref 26.6–33.0)
MCHC: 33.6 g/dL (ref 31.5–35.7)
MCV: 93 fL (ref 79–97)
MONOS ABS: 0.8 10*3/uL (ref 0.1–0.9)
Monocytes: 12 %
NEUTROS PCT: 44 %
Neutrophils Absolute: 3 10*3/uL (ref 1.4–7.0)
PLATELETS: 187 10*3/uL (ref 150–379)
RBC: 4.69 x10E6/uL (ref 3.77–5.28)
RDW: 13.2 % (ref 12.3–15.4)
WBC: 6.7 10*3/uL (ref 3.4–10.8)

## 2017-05-03 LAB — HEPATIC FUNCTION PANEL
ALBUMIN: 4.4 g/dL (ref 3.5–4.7)
ALT: 15 IU/L (ref 0–32)
AST: 30 IU/L (ref 0–40)
Alkaline Phosphatase: 39 IU/L (ref 39–117)
BILIRUBIN TOTAL: 0.6 mg/dL (ref 0.0–1.2)
Bilirubin, Direct: 0.16 mg/dL (ref 0.00–0.40)
TOTAL PROTEIN: 6.6 g/dL (ref 6.0–8.5)

## 2017-05-03 LAB — VITAMIN D 25 HYDROXY (VIT D DEFICIENCY, FRACTURES): Vit D, 25-Hydroxy: 52 ng/mL (ref 30.0–100.0)

## 2017-05-07 DIAGNOSIS — Z961 Presence of intraocular lens: Secondary | ICD-10-CM | POA: Diagnosis not present

## 2017-05-07 DIAGNOSIS — H04123 Dry eye syndrome of bilateral lacrimal glands: Secondary | ICD-10-CM | POA: Diagnosis not present

## 2017-05-07 DIAGNOSIS — D3132 Benign neoplasm of left choroid: Secondary | ICD-10-CM | POA: Diagnosis not present

## 2017-05-07 DIAGNOSIS — H1851 Endothelial corneal dystrophy: Secondary | ICD-10-CM | POA: Diagnosis not present

## 2017-05-07 DIAGNOSIS — H4720 Unspecified optic atrophy: Secondary | ICD-10-CM | POA: Diagnosis not present

## 2017-06-19 ENCOUNTER — Encounter: Payer: Self-pay | Admitting: *Deleted

## 2017-07-02 ENCOUNTER — Encounter: Payer: Medicare Other | Admitting: Internal Medicine

## 2017-09-10 ENCOUNTER — Encounter: Payer: Medicare Other | Admitting: Internal Medicine

## 2017-09-10 ENCOUNTER — Other Ambulatory Visit: Payer: Self-pay | Admitting: Family Medicine

## 2017-09-13 ENCOUNTER — Ambulatory Visit: Payer: Medicare Other | Admitting: Family Medicine

## 2017-09-17 ENCOUNTER — Non-Acute Institutional Stay: Payer: Medicare Other | Admitting: Internal Medicine

## 2017-09-17 ENCOUNTER — Encounter: Payer: Self-pay | Admitting: Internal Medicine

## 2017-09-17 VITALS — BP 118/64 | HR 73 | Temp 97.8°F | Resp 18 | Ht 62.0 in | Wt 117.6 lb

## 2017-09-17 DIAGNOSIS — D329 Benign neoplasm of meninges, unspecified: Secondary | ICD-10-CM

## 2017-09-17 DIAGNOSIS — J309 Allergic rhinitis, unspecified: Secondary | ICD-10-CM

## 2017-09-17 DIAGNOSIS — E785 Hyperlipidemia, unspecified: Secondary | ICD-10-CM

## 2017-09-17 DIAGNOSIS — I6523 Occlusion and stenosis of bilateral carotid arteries: Secondary | ICD-10-CM

## 2017-09-17 DIAGNOSIS — I10 Essential (primary) hypertension: Secondary | ICD-10-CM

## 2017-09-17 DIAGNOSIS — I2510 Atherosclerotic heart disease of native coronary artery without angina pectoris: Secondary | ICD-10-CM

## 2017-09-17 DIAGNOSIS — M810 Age-related osteoporosis without current pathological fracture: Secondary | ICD-10-CM

## 2017-09-17 DIAGNOSIS — K219 Gastro-esophageal reflux disease without esophagitis: Secondary | ICD-10-CM

## 2017-09-17 DIAGNOSIS — K5900 Constipation, unspecified: Secondary | ICD-10-CM

## 2017-09-17 MED ORDER — POLYETHYLENE GLYCOL 3350 17 GM/SCOOP PO POWD: 17.0000 g | Freq: Every day | ORAL | 1 refills | Status: AC | PRN

## 2017-09-17 NOTE — Progress Notes (Signed)
[facility]  Provider: Blanchie Serve MD   Location:  [place] of Service:  Clinic (12)  PCP: Blanchie Serve, MD Patient Care Team: Blanchie Serve, MD as PCP - General (Internal Medicine) Minus Breeding, MD (Cardiology) Clent Jacks, MD (Ophthalmology) Inda Castle, MD (Inactive) as Consulting Physician (Gastroenterology)  Extended Emergency Contact Information Primary Emergency Contact: Concepcion Elk Address: [address], [place] [address] [name] of [place] Phone: [phone] Relation: Spouse  Goals of Care: Advanced Directive information Advanced Directives 12/15/2015  Does Patient Have a Medical Advance Directive? Yes  Type of Paramedic of [place];Living will  Does patient want to make changes to medical advance directive? No - Patient declined  Copy of [place] in Chart? No - copy requested      Chief Complaint  Patient presents with  . New Patient (Initial Visit)    Establishing care with [place].   . Medication Refill    No refills needed at this time    HPI: Patient is a 82 y.o. female seen today to establish care. She was seeing Dr Redge Gainer prior to this and her last visit was on 05/02/17.   Postmenopausal osteoporosis- Currently on calcium and vitamin d supplement with prolia injection, last prolia received on 01/31/17. dexa scan October 2017. No falls reported  Hyperlipidemia- Takes pravastatin 80 mg daily with welchol 3 tabs bid and lovaza 2 capsules bid. Tries to be careful with diet and exercises- walks 2 miles a day and does group exercises.   Allergic rhinitis-Takes flonase daily with some help.   gerd- controlled with ranitidine 150 mg daily  Hypertension- Currently on HCTZ 12.5 mg daily. Denies leg edema, dizziness or headache  CAD- denies any chest pain, stent in 1998. Follows with Dr [facility] Rouge. Currently on baby aspirin  and statin  Meningioma- s/p left craniotomy and resection in 2016. Symptom free at present.   Carotid artery stenosis- bilateral, followed by cardiology, medical management with aspirin and statin   Past Medical History:  Diagnosis Date  . Allergy    seasonal  . Anemia   . Arthritis   . Benign neoplasm of descending colon 10/22/2014  . CAD (coronary artery disease)    LAD lession   . Cancer ([facility])    skin  . Cataract    removed bilaterally  . Diverticulosis 11/00  . Esophagitis, acute   . Fuch's endothelial dystrophy 04/2009   Dr, Shanon Rosser   . Gastritis 11/00  . GERD (gastroesophageal reflux disease) 11/00  . Headache   . Hemorrhoids 8/05  . Hyperlipidemia   . Hypertension   . Lung nodule 1/09  . NSVD (normal spontaneous vaginal delivery) 1967   female   . Osteopenia   . Osteoporosis   . Thyroid nodule 8/08   Past Surgical History:  Procedure Laterality Date  . bilateral cataract surgery  06/2007   DR. Groat   . bilateral cataract surgery  10/08   Dr. Katy Fitch   . cardiac cath 40-50%LAD  6/07  . cardiac cath with angioplasty & stent replacement  4/98  . COLONOSCOPY    . CRANIOTOMY Left 05/13/2015   Procedure: Left frontotemporal Craniotomy for Meningioma resection;  Surgeon: Consuella Lose, MD;  Location: [facility];  Service: Neurosurgery;  Laterality: Left;  . CT LUNG SCREENING  6/07  . FACIAL COSMETIC SURGERY  7/96  Dr. Mikle Bosworth    . MENISCUS REPAIR      reports that she has quit smoking. Her smoking use included cigarettes. She has a 13.50 pack-year smoking history. she has never used smokeless tobacco. She reports that she does not drink alcohol or use drugs. Social History   Socioeconomic History  . Marital status: Married    Spouse name: Juanda Crumble  . Number of children: 1  . Years of education: 4  . Highest education level: Not on file  Social Needs  . Financial resource strain: Not on file  . Food insecurity - worry: Not on file  . Food insecurity -  inability: Not on file  . Transportation needs - medical: Not on file  . Transportation needs - non-medical: Not on file  Occupational History  . Occupation: Retired Pharmacist, hospital  Tobacco Use  . Smoking status: Former Smoker    Packs/day: 0.50    Years: 27.00    Pack years: 13.50    Types: Cigarettes  . Smokeless tobacco: Never Used  Substance and Sexual Activity  . Alcohol use: No    Alcohol/week: 0.0 oz  . Drug use: No  . Sexual activity: No  Other Topics Concern  . Not on file  Social History Narrative   Married to Eva in 1961   Lives in [place] at [facility]   One child, Santiago Glad   No pets   Caffeine   Exercises (water aerobics, walking, chair)   Living Will, HCPOA, DNR   Glasses   Hearing Aids    Functional Status Survey:    Family History  Problem Relation Age of Onset  . Breast cancer Sister        lumpectomy  . Heart disease Sister   . Stroke Sister   . GER disease Sister   . Dementia Sister   . Hepatitis Brother   . Heart attack Brother   . Stroke Brother   . Diabetes Mother   . Hypertension Mother   . Diabetes Father   . Hypertension Father   . Alzheimer's disease Sister   . Colon cancer Neg Hx     Health Maintenance  Topic Date Due  . PNA vac Low Risk Adult (2 of 2 - PPSV23) 11/30/2017 (Originally 08/21/2014)  . MAMMOGRAM  10/23/2017  . DEXA SCAN  07/03/2018  . TETANUS/TDAP  04/01/2021  . INFLUENZA VACCINE  Completed    Allergies  Allergen Reactions  . Sulfonamide Derivatives   . K+ Care [Potassium Chloride] Diarrhea  . Lipitor [Atorvastatin Calcium]     Myalgias    . Sulfa Antibiotics Rash    Fever blisters    Outpatient Encounter Medications as of 09/17/2017  Medication Sig  . acyclovir ointment (ZOVIRAX) 5 % Apply 1 application topically every 3 (three) hours. Apply frequently and sparingly to cold sores as needed.  Marland Kitchen aspirin EC 81 MG tablet Take 81 mg by mouth daily.  . Calcium-Vitamin D-Vitamin K (CALCIUM  + D) [phone] MG-UNT-MCG CHEW Chew 1 tablet by mouth daily.  . Cholecalciferol (VITAMIN D3) 1000 UNITS CAPS Take 2,000 Units by mouth daily.   . CO ENZYME Q-10 PO Take 1 capsule by mouth daily.   . colesevelam (WELCHOL) 625 MG tablet Take 3 tablets (1,875 mg total) by mouth 2 (two) times daily with a meal.  . denosumab (PROLIA) 60 MG/ML SOLN injection Inject 60 mg into the skin every 6 (six) months. Administer in upper arm, thigh, or abdomen  . fluticasone (FLONASE)  50 MCG/ACT nasal spray USE 1 TO 2 SPRAYS IN EACH  NOSTRIL DAILY AS DIRECTED  FOR ALLERGIC RHINITIS  . hydrochlorothiazide (MICROZIDE) 12.5 MG capsule TAKE 1 CAPSULE DAILY  . IRON PO Take 1 tablet by mouth every other day.   Marland Kitchen L-Lysine 1000 MG TABS Take 1,000 mg by mouth daily.  . magnesium oxide (MAG-OX) 400 MG tablet Take 400 mg by mouth daily.  . Multiple Vitamin (MULTI-VITAMIN PO) Take 1 tablet by mouth daily.   Marland Kitchen omega-3 acid ethyl esters (LOVAZA) 1 g capsule TAKE 2 CAPSULES (=2GM)     TWICE DAILY  . pravastatin (PRAVACHOL) 80 MG tablet TAKE 1 TABLET DAILY  . ranitidine (ZANTAC) 150 MG tablet Take 150 mg by mouth daily.  . sodium chloride (MURO 128) 5 % ophthalmic ointment Place 1 application into both eyes 4 (four) times daily.  . valACYclovir (VALTREX) 1000 MG tablet Take 1,000 mg by mouth as needed (for fever blisters).  . [DISCONTINUED] valACYclovir (VALTREX) 1000 MG tablet Take 1 tablet (1,000 mg total) by mouth daily. As directed  . [DISCONTINUED] hydrochlorothiazide (MICROZIDE) 12.5 MG capsule Take 12.5 mg by mouth daily.  . [DISCONTINUED] omega-3 acid ethyl esters (LOVAZA) 1 g capsule Take 2 g by mouth 2 (two) times daily.  . [DISCONTINUED] pravastatin (PRAVACHOL) 80 MG tablet Take 80 mg by mouth daily.   No facility-administered encounter medications on file as of 09/17/2017.     Review of Systems  Constitutional: Negative for appetite change, chills, fatigue and fever.  HENT: Positive for congestion, hearing  loss, postnasal drip and rhinorrhea. Negative for mouth sores, sinus pressure, sinus pain, sore throat and trouble swallowing.        Hearing aid present  Eyes: Positive for visual disturbance. Negative for pain, discharge and itching.       Has visual disturbance and wears corrective glasses, uses artificial tears  Respiratory: Negative for cough, choking, shortness of breath and wheezing.   Cardiovascular: Negative for chest pain, palpitations and leg swelling.  Gastrointestinal: Positive for constipation. Negative for abdominal pain, anal bleeding, blood in stool, diarrhea, nausea, rectal pain and vomiting.       Takes miralax about once a week and magnesium supplement daily to help with bowel movement  Endocrine:       Has thyroid nodule   Genitourinary: Negative for dysuria, hematuria and pelvic pain.       Wakes up occasionally at night to urinate- maximum once  Musculoskeletal: Positive for arthralgias. Negative for back pain and gait problem.       Occassional pain to knee joints at night time, resting helps  Skin: Negative for rash and wound.       Dry skin  Neurological: Negative for dizziness, tremors, seizures, syncope, numbness and headaches.  Hematological: Does not bruise/bleed easily.  Psychiatric/Behavioral: Negative for behavioral problems, confusion, dysphoric mood and sleep disturbance. The patient is not nervous/anxious.     Vitals:   09/17/17 1424  BP: 118/64  Pulse: 73  Resp: 18  Temp: 97.8 F (36.6 C)  TempSrc: Oral  SpO2: 99%  Weight: 117 lb 9.6 oz (53.3 kg)  Height: 5\' 2"  (1.575 m)   Body mass index is 21.51 kg/m.   Wt Readings from Last 3 Encounters:  09/17/17 117 lb 9.6 oz (53.3 kg)  05/02/17 116 lb (52.6 kg)  01/31/17 115 lb (52.2 kg)   Physical Exam  Constitutional: She is oriented to person, place, and time. She appears well-developed and well-nourished. No distress.  HENT:  Head: Normocephalic and atraumatic.  Right Ear: External ear  normal.  Left Ear: External ear normal.  Nose: Nose normal.  Mouth/Throat: Oropharynx is clear and moist. No oropharyngeal exudate.  Eyes: Conjunctivae and EOM are normal. Pupils are equal, round, and reactive to light. Right eye exhibits no discharge. Left eye exhibits no discharge. No scleral icterus.  Neck: Normal range of motion. Neck supple.  Cardiovascular: Normal rate and regular rhythm.  No murmur heard. Pulmonary/Chest: Effort normal and breath sounds normal. No respiratory distress. She has no wheezes. She has no rales.  Abdominal: Soft. Bowel sounds are normal. She exhibits no mass. There is no tenderness.  Musculoskeletal: Normal range of motion. She exhibits no edema, tenderness or deformity.  Lymphadenopathy:    She has no cervical adenopathy.  Neurological: She is alert and oriented to person, place, and time.  Skin: Skin is warm and dry. No rash noted. She is not diaphoretic. No pallor.  Psychiatric: She has a normal mood and affect. Her behavior is normal.    Labs reviewed: Basic Metabolic Panel: Recent Labs    12/20/16 1057 05/02/17 0944  NA 143 142  K 4.0 4.3  CL 102 101  CO2 27 27  GLUCOSE 81 87  BUN 14 15  CREATININE 0.87 0.97  CALCIUM 9.4 9.7   Liver Function Tests: Recent Labs    12/20/16 1057 05/02/17 0944  AST 26 30  ALT 13 15  ALKPHOS 35* 39  BILITOT 0.5 0.6  PROT 6.3 6.6  ALBUMIN 4.3 4.4   No results for input(s): LIPASE, AMYLASE in the last 8760 hours. No results for input(s): AMMONIA in the last 8760 hours. CBC: Recent Labs    12/20/16 1057 05/02/17 0944  WBC 6.9 6.7  NEUTROABS 3.6 3.0  HGB 14.7 14.7  HCT 44.0 43.8  MCV 97 93  PLT 175 187   Cardiac Enzymes: No results for input(s): CKTOTAL, CKMB, CKMBINDEX, TROPONINI in the last 8760 hours. BNP: Invalid input(s): POCBNP No results found for: HGBA1C Lab Results  Component Value Date   TSH 0.862 04/14/2014   No results found for: VITAMINB12 No results found for:  FOLATE No results found for: IRON, TIBC, FERRITIN  Lipid Panel: Recent Labs    12/20/16 1058 05/02/17 0944  CHOL 182 178  HDL 89 94  LDLCALC  --  73  TRIG 57 57  CHOLHDL  --  1.9   No results found for: HGBA1C  Procedures since last visit: No results found.  Assessment/Plan  1. Essential hypertension Continue HCTZ, reviewed labs from 8/18. Check bmp prior to next visit - CMP; Future - CBC (no diff); Future  2. Atherosclerosis of native coronary artery of native heart without angina pectoris Chest pain free. Continue aspirin and statin, f/u with cardiology - CBC (no diff); Future  3. Bilateral carotid artery stenosis Continue aspirin and statin - CBC (no diff); Future  4. Allergic rhinitis, unspecified seasonality, unspecified trigger Continue flonase  5. Gastroesophageal reflux disease, esophagitis presence not specified Continue ranitidine - CBC (no diff); Future  6. Meningioma [facility]) S/p resection, symptom free  7. Osteoporosis, postmenopausal Continue prolia q6 month, continue daily calcium and vit d. Need to verify on last prolia injection date and provide next dose.   8. Hyperlipidemia, unspecified hyperlipidemia type Continue pravastatin with lovaza and welchol.  - Lipid Panel; Future  9. Constipation Advised to take miralax daily as needed. Continue magnesium supplement. Hydration encouraged  Reviewed prior OV notes from her PCP, cardiologist  and reviewed labs  Labs/tests ordered:   Orders Placed This Encounter  Procedures  . CMP  . Lipid Panel  . CBC (no diff)   Next appointment: 4 months  Communication: reviewed care plan with patient    Blanchie Serve, MD Internal Medicine [place], [place] [address] Cell Phone (Monday-Friday 8 am - 5 pm): [phone] On Call: [phone] and follow prompts after 5 pm and on weekends Office Phone: [phone] Office Fax: [phone]

## 2017-12-04 ENCOUNTER — Telehealth: Payer: Self-pay | Admitting: Internal Medicine

## 2017-12-04 NOTE — Telephone Encounter (Signed)
Called Yolanda [name] to scheduled Prolia injection. Left message x 2 no response. I also left my name and telephone #  No authorizations required- patient pay nothing per ins co.....[employer]

## 2017-12-06 ENCOUNTER — Other Ambulatory Visit: Payer: Self-pay | Admitting: Internal Medicine

## 2017-12-06 DIAGNOSIS — Z1231 Encounter for screening mammogram for malignant neoplasm of breast: Secondary | ICD-10-CM

## 2017-12-11 ENCOUNTER — Ambulatory Visit: Payer: Medicare Other | Admitting: Family

## 2017-12-11 ENCOUNTER — Encounter: Payer: Self-pay | Admitting: Family

## 2017-12-11 VITALS — BP 134/80 | HR 70 | Temp 97.9°F | Resp 16 | Ht 62.0 in | Wt 118.6 lb

## 2017-12-11 DIAGNOSIS — R059 Cough, unspecified: Secondary | ICD-10-CM

## 2017-12-11 DIAGNOSIS — R05 Cough: Secondary | ICD-10-CM

## 2017-12-11 DIAGNOSIS — R0981 Nasal congestion: Secondary | ICD-10-CM | POA: Diagnosis not present

## 2017-12-11 DIAGNOSIS — J309 Allergic rhinitis, unspecified: Secondary | ICD-10-CM

## 2017-12-11 MED ORDER — LORATADINE 10 MG PO TABS: 10.0000 mg | ORAL_TABLET | Freq: Every day | ORAL | 11 refills | Status: DC

## 2017-12-11 NOTE — Progress Notes (Signed)
Location:   [facility]:   Clinic  Provider: Dinah Ngetich FNP-C  Blanchie Serve, MD  Patient Care Team: Blanchie Serve, MD as PCP - General (Internal Medicine) Minus Breeding, MD (Cardiology) Clent Jacks, MD (Ophthalmology) Inda Castle, MD (Inactive) as Consulting Physician (Gastroenterology)  Extended Emergency Contact Information Primary Emergency Contact: Concepcion Elk Address: [address], [place] [address] [name] of [place] Phone: [phone] Relation: Spouse  Code Status:  DNR Goals of care: Advanced Directive information Advanced Directives 12/11/2017  Does Patient Have a Medical Advance Directive? No  Type of Advance Directive -  Does patient want to make changes to medical advance directive? -  Copy of [place] in Chart? -     Chief Complaint  Patient presents with  . Acute Visit    cough and nasal congestion x12 days    HPI:  Pt is a 83 y.o. female seen today at [facility] clinic for an acute visit for evaluation of cough and nasal congestion for 12 days.she has been coughing up greenish mucus but seems to be getting better.Also having nasal drainage.she has taken Zyrtec,Pseudafed and allegra but medication made her sleepy. Excedrin made her blood pressure go up.she denies any sore throat,difficulty swallowing,fever,chills,wheezing or shortness of breath.   Past Medical History:  Diagnosis Date  . Allergy    seasonal  . Anemia   . Arthritis   . Benign neoplasm of descending colon 10/22/2014  . CAD (coronary artery disease)    LAD lession   . Cancer ([place])    skin  . Cataract    removed bilaterally  . Diverticulosis 11/00  . Esophagitis, acute   . Fuch's endothelial dystrophy 04/2009   Dr, Shanon Rosser   . Gastritis 11/00  . GERD (gastroesophageal reflux disease) 11/00  . Headache   . Hemorrhoids 8/05  . Hyperlipidemia   . Hypertension   . Lung  nodule 1/09  . NSVD (normal spontaneous vaginal delivery) 1967   female   . Osteopenia   . Osteoporosis   . Thyroid nodule 8/08   Past Surgical History:  Procedure Laterality Date  . bilateral cataract surgery  06/2007   DR. Groat   . bilateral cataract surgery  10/08   Dr. Katy Fitch   . cardiac cath 40-50%LAD  6/07  . cardiac cath with angioplasty & stent replacement  4/98  . COLONOSCOPY    . CRANIOTOMY Left 05/13/2015   Procedure: Left frontotemporal Craniotomy for Meningioma resection;  Surgeon: Consuella Lose, MD;  Location: [facility];  Service: Neurosurgery;  Laterality: Left;  . CT LUNG SCREENING  6/07  . FACIAL COSMETIC SURGERY  7/96   Dr. Mikle Bosworth    . MENISCUS REPAIR      Allergies  Allergen Reactions  . Sulfonamide Derivatives   . K+ Care [Potassium Chloride] Diarrhea  . Lipitor [Atorvastatin Calcium]     Myalgias    . Sulfa Antibiotics Rash    Fever blisters    Outpatient Encounter Medications as of 12/11/2017  Medication Sig  . acyclovir ointment (ZOVIRAX) 5 % Apply 1 application topically every 3 (three) hours. Apply frequently and sparingly to cold sores as needed.  Marland Kitchen aspirin EC 81 MG tablet Take 81 mg by mouth daily.  . Calcium-Vitamin D-Vitamin K (CALCIUM + D) [phone] MG-UNT-MCG CHEW Chew 1 tablet by mouth daily.  . Cholecalciferol (VITAMIN D3)  1000 UNITS CAPS Take 2,000 Units by mouth daily.   . CO ENZYME Q-10 PO Take 1 capsule by mouth daily.   . colesevelam (WELCHOL) 625 MG tablet Take 3 tablets (1,875 mg total) by mouth 2 (two) times daily with a meal.  . denosumab (PROLIA) 60 MG/ML SOLN injection Inject 60 mg into the skin every 6 (six) months. Administer in upper arm, thigh, or abdomen  . fluticasone (FLONASE) 50 MCG/ACT nasal spray USE 1 TO 2 SPRAYS IN EACH  NOSTRIL DAILY AS DIRECTED  FOR ALLERGIC RHINITIS  . hydrochlorothiazide (MICROZIDE) 12.5 MG capsule TAKE 1 CAPSULE DAILY  . IRON PO Take 1 tablet by mouth every other day.   Marland Kitchen L-Lysine 1000  MG TABS Take 1,000 mg by mouth daily.  . magnesium oxide (MAG-OX) 400 MG tablet Take 400 mg by mouth daily.  . Multiple Vitamin (MULTI-VITAMIN PO) Take 1 tablet by mouth daily.   Marland Kitchen omega-3 acid ethyl esters (LOVAZA) 1 g capsule TAKE 2 CAPSULES (=2GM)     TWICE DAILY  . polyethylene glycol powder (GLYCOLAX/MIRALAX) powder Take 17 g by mouth daily as needed for moderate constipation.  . pravastatin (PRAVACHOL) 80 MG tablet TAKE 1 TABLET DAILY  . ranitidine (ZANTAC) 150 MG tablet Take 150 mg by mouth daily.  . sodium chloride (MURO 128) 5 % ophthalmic ointment Place 1 application into both eyes 4 (four) times daily.  . valACYclovir (VALTREX) 1000 MG tablet Take 1,000 mg by mouth as needed (for fever blisters).  . loratadine (CLARITIN) 10 MG tablet Take 1 tablet (10 mg total) by mouth daily.   No facility-administered encounter medications on file as of 12/11/2017.     Review of Systems  Constitutional: Negative for appetite change, chills, fatigue and fever.  HENT: Positive for congestion, hearing loss and rhinorrhea. Negative for postnasal drip, sinus pressure, sinus pain, sneezing, sore throat and trouble swallowing.        Wears hearing aid   Eyes: Negative for pain, discharge, redness and itching.       Wears eye glasses   Respiratory: Positive for cough. Negative for chest tightness, shortness of breath and wheezing.   Cardiovascular: Negative for chest pain, palpitations and leg swelling.  Gastrointestinal: Negative for abdominal distention, abdominal pain, constipation, diarrhea, nausea and vomiting.  Musculoskeletal: Negative for arthralgias.  Skin: Negative for color change, pallor and rash.  Neurological: Negative for dizziness, light-headedness and headaches.  Psychiatric/Behavioral: Negative for agitation and sleep disturbance.    Immunization History  Administered Date(s) Administered  . Influenza, High Dose Seasonal PF 07/12/2015, 06/27/2016, 07/02/2017  .  Influenza-Unspecified 07/02/2017  . Pneumococcal Conjugate-13 08/21/2013   Pertinent  Health Maintenance Due  Topic Date Due  . PNA vac Low Risk Adult (2 of 2 - PPSV23) 08/21/2014  . MAMMOGRAM  10/23/2017  . DEXA SCAN  07/03/2018  . INFLUENZA VACCINE  Completed   Fall Risk  05/02/2017 12/20/2016 07/25/2016 03/08/2016 11/01/2015  Falls in the past year? No No No No No  Number falls in past yr: - - - - -  Comment - - - - -  Injury with Fall? - - - - -  Comment - - - - -  Risk for fall due to : - - - - -    Vitals:   12/11/17 1305  BP: 134/80  Pulse: 70  Resp: 16  Temp: 97.9 F (36.6 C)  TempSrc: Oral  SpO2: 94%  Weight: 118 lb 9.6 oz (53.8 kg)  Height: 5\' 2"  (  1.575 m)   Body mass index is 21.69 kg/m. Physical Exam  Constitutional: She is oriented to person, place, and time. She appears well-developed and well-nourished. No distress.  HENT:  Head: Normocephalic.  Right Ear: External ear normal.  Left Ear: External ear normal.  Nose: Nose normal.  Mouth/Throat: Oropharynx is clear and moist. No oropharyngeal exudate.  Bilateral hearing aids   Eyes: Conjunctivae and EOM are normal. Pupils are equal, round, and reactive to light. Right eye exhibits no discharge. Left eye exhibits no discharge. No scleral icterus.  Eye glasses in place   Neck: Normal range of motion. No JVD present. No thyromegaly present.  Cardiovascular: Normal rate, regular rhythm, normal heart sounds and intact distal pulses. Exam reveals no gallop and no friction rub.  No murmur heard. Pulmonary/Chest: Effort normal and breath sounds normal. No respiratory distress. She has no wheezes. She has no rales.  Abdominal: Soft. Bowel sounds are normal. She exhibits no distension. There is no tenderness. There is no rebound and no guarding.  Lymphadenopathy:    She has no cervical adenopathy.  Neurological: She is oriented to person, place, and time. Coordination normal.  Skin: Skin is warm and dry. No rash noted.  No erythema. No pallor.  Psychiatric: She has a normal mood and affect.    Labs reviewed: Recent Labs    12/20/16 1057 05/02/17 0944  NA 143 142  K 4.0 4.3  CL 102 101  CO2 27 27  GLUCOSE 81 87  BUN 14 15  CREATININE 0.87 0.97  CALCIUM 9.4 9.7   Recent Labs    12/20/16 1057 05/02/17 0944  AST 26 30  ALT 13 15  ALKPHOS 35* 39  BILITOT 0.5 0.6  PROT 6.3 6.6  ALBUMIN 4.3 4.4   Recent Labs    12/20/16 1057 05/02/17 0944  WBC 6.9 6.7  NEUTROABS 3.6 3.0  HGB 14.7 14.7  HCT 44.0 43.8  MCV 97 93  PLT 175 187   Lab Results  Component Value Date   TSH 0.862 04/14/2014   No results found for: HGBA1C Lab Results  Component Value Date   CHOL 178 05/02/2017   HDL 94 05/02/2017   LDLCALC 73 05/02/2017   TRIG 57 05/02/2017   CHOLHDL 1.9 05/02/2017    Significant Diagnostic Results in last 30 days:  No results found.  Assessment/Plan 1. Allergic rhinitis Afebrile.Nasal drainage.no sneezing or itchy eyes.Has used several OTC medication but stopped due to drowsiness.encourage to start on Loratadine 10 mg tablet one by mouth daily.Notify provider's office if no improvement in 1 week.    2. Nasal Congestion  Afebrile.Suspect due to allergies. No sinus tenderness on exam.nasal nares exam negative.continue on fluticasone 50 mcg nasal spray two sprays into each nostril daily.   3. Cough   Afebrile.Bilateral lung exam clear to Auscultation.continue to monitor. Notify provide if symptoms worsen or running any fever or chills. Continue to monitor.    Family/ staff Communication: Reviewed plan of care with patient and facility Nurse supervisor  Labs/tests ordered: None   Sandrea Hughs, NP

## 2017-12-11 NOTE — Patient Instructions (Signed)
Nonallergic Rhinitis Nonallergic rhinitis is a condition that causes symptoms that affect the nose, such as a runny nose and a stuffed-up nose (nasal congestion) that can make it hard to breathe through the nose. This condition is different from having an allergy (allergic rhinitis). Allergic rhinitis occurs when the body's defense system (immune system) reacts to a substance that you are allergic to (allergen), such as pollen, pet dander, mold, or dust. Nonallergic rhinitis has many similar symptoms, but it is not caused by allergens. Nonallergic rhinitis can be a short-term or long-term problem. What are the causes? This condition can be caused by many different things. Some common types of nonallergic rhinitis include: Infectious rhinitis  This is usually due to an infection in the upper respiratory tract. Vasomotor rhinitis  This is the most common type of long-term nonallergic rhinitis.  It is caused by too much blood flow through the nose, which makes the tissue inside of the nose swell.  Symptoms are often triggered by strong odors, cold air, stress, drinking alcohol, cigarette smoke, or changes in the weather. Occupational rhinitis  This type is caused by triggers in the workplace, such as chemicals, dusts, animal dander, or air pollution. Hormonal rhinitis  This type occurs in women as a result of an increase in the female hormone estrogen.  It may occur during pregnancy, puberty, and menstrual cycles.  Symptoms improve when estrogen levels drop. Drug-induced rhinitis Several drugs can cause nonallergic rhinitis, including:  Medicines that are used to treat high blood pressure, heart disease, and Parkinson disease.  Aspirin and NSAIDs.  Over-the-counter nasal decongestant sprays. These can cause a type of nonallergic rhinitis (rhinitis medicamentosa) when they are used for more than a few days.  Nonallergic rhinitis with eosinophilia syndrome (NARES)  This type is caused  by having too much of a certain type of white blood cell (eosinophil). Nonallergic rhinitis can also be caused by a reaction to eating hot or spicy foods. This does not usually cause long-term symptoms. In some cases, the cause of nonallergic rhinitis is not known. What increases the risk? You are more likely to develop this condition if:  You are 81-49 years of age.  You are a woman. Women are twice as likely to have this condition.  What are the signs or symptoms? Common symptoms of this condition include:  Nasal congestion.  Runny nose.  The feeling of mucus going down the back of the throat (postnasal drip).  Trouble sleeping at night and daytime sleepiness.  Less common symptoms include:  Sneezing.  Coughing.  Itchy nose.  Bloodshot eyes.  How is this diagnosed? This condition may be diagnosed based on:  Your symptoms and medical history.  A physical exam.  Allergy testing to rule out allergic rhinitis. You may have skin tests or blood tests.  In some cases, the health care provider may take a swab of nasal secretions to look for an increased number of eosinophils. This would be done to confirm a diagnosis of NARES. How is this treated? Treatment for this condition depends on the cause. No single treatment works for everyone. Work with your health care provider to find the best treatment for you. Treatment may include:  Avoiding the things that trigger your symptoms.  Using medicines to relieve congestion, such as: ? Steroid nasal spray. There are many types. You may need to try a few to find out which one works best. ? Decongestant medicine. This may be an oral medicine or a nasal spray. These  medicines are only used for a short time.  Using medicines to relieve a runny nose. These may include antihistamine medicines or anticholinergic nasal sprays.  Surgery to remove tissue from inside the nose may be needed in severe cases if the condition has not improved  after 6-12 months of medical treatment. Follow these instructions at home:  Take or use over-the-counter and prescription medicines only as told by your health care provider. Do not stop using your medicine even if you start to feel better.  Use salt-water (saline) rinses or other solutions (nasal washes or irrigations) to wash or rinse out the inside of your nose as told by your health care provider.  Do not take NSAIDs or medicines that contain aspirin if they make your symptoms worse.  Do not drink alcohol if it makes your symptoms worse.  Do not use any tobacco products, such as cigarettes, chewing tobacco, and e-cigarettes. If you need help quitting, ask your health care provider.  Avoid secondhand smoke.  Get some exercise every day. Exercise may help reduce symptoms of nonallergic rhinitis for some people. Ask your health care provider how much exercise and what types of exercise are safe for you.  Sleep with the head of your bed raised (elevated). This may reduce nighttime nasal congestion.  Keep all follow-up visits as told by your health care provider. This is important. Contact a health care provider if:  You have a fever.  Your symptoms are getting worse at home.  Your symptoms are not responding to medicine.  You develop new symptoms, especially a headache or nosebleed. This information is not intended to replace advice given to you by your health care provider. Make sure you discuss any questions you have with your health care provider. Document Released: 01/10/2016 Document Revised: 02/24/2016 Document Reviewed: 12/09/2015 Elsevier Interactive Patient Education  2018 [employer].

## 2017-12-27 ENCOUNTER — Ambulatory Visit
Admission: RE | Admit: 2017-12-27 | Discharge: 2017-12-27 | Disposition: A | Discharge status: Home / Self Care | Payer: Medicare Other | Source: Ambulatory Visit | Attending: Internal Medicine | Admitting: Internal Medicine

## 2017-12-27 DIAGNOSIS — Z1231 Encounter for screening mammogram for malignant neoplasm of breast: Secondary | ICD-10-CM

## 2018-01-03 ENCOUNTER — Other Ambulatory Visit: Payer: Self-pay

## 2018-01-03 DIAGNOSIS — E785 Hyperlipidemia, unspecified: Secondary | ICD-10-CM

## 2018-01-03 DIAGNOSIS — I6523 Occlusion and stenosis of bilateral carotid arteries: Secondary | ICD-10-CM

## 2018-01-03 DIAGNOSIS — I10 Essential (primary) hypertension: Secondary | ICD-10-CM

## 2018-01-03 DIAGNOSIS — I2510 Atherosclerotic heart disease of native coronary artery without angina pectoris: Secondary | ICD-10-CM

## 2018-01-03 DIAGNOSIS — K219 Gastro-esophageal reflux disease without esophagitis: Secondary | ICD-10-CM

## 2018-01-07 ENCOUNTER — Telehealth: Payer: Self-pay | Admitting: Internal Medicine

## 2018-01-07 DIAGNOSIS — E785 Hyperlipidemia, unspecified: Secondary | ICD-10-CM | POA: Diagnosis not present

## 2018-01-07 DIAGNOSIS — I6523 Occlusion and stenosis of bilateral carotid arteries: Secondary | ICD-10-CM | POA: Diagnosis not present

## 2018-01-07 DIAGNOSIS — I10 Essential (primary) hypertension: Secondary | ICD-10-CM | POA: Diagnosis not present

## 2018-01-07 DIAGNOSIS — K219 Gastro-esophageal reflux disease without esophagitis: Secondary | ICD-10-CM | POA: Diagnosis not present

## 2018-01-07 DIAGNOSIS — I2510 Atherosclerotic heart disease of native coronary artery without angina pectoris: Secondary | ICD-10-CM | POA: Diagnosis not present

## 2018-01-07 LAB — COMPREHENSIVE METABOLIC PANEL
AG Ratio: 1.7 (calc) (ref 1.0–2.5)
ALT: 13 U/L (ref 6–29)
AST: 31 U/L (ref 10–35)
Albumin: 4.1 g/dL (ref 3.6–5.1)
Alkaline phosphatase (APISO): 47 U/L (ref 33–130)
BILIRUBIN TOTAL: 0.7 mg/dL (ref 0.2–1.2)
BUN/Creatinine Ratio: 19 (calc) (ref 6–22)
BUN: 19 mg/dL (ref 7–25)
CALCIUM: 9.8 mg/dL (ref 8.6–10.4)
CO2: 32 mmol/L (ref 20–32)
CREATININE: 0.98 mg/dL — AB (ref 0.60–0.88)
Chloride: 103 mmol/L (ref 98–110)
GLUCOSE: 78 mg/dL (ref 65–99)
Globulin: 2.4 g/dL (calc) (ref 1.9–3.7)
Potassium: 4 mmol/L (ref 3.5–5.3)
Sodium: 141 mmol/L (ref 135–146)
Total Protein: 6.5 g/dL (ref 6.1–8.1)

## 2018-01-07 LAB — CBC
HEMATOCRIT: 41.9 % (ref 35.0–45.0)
HEMOGLOBIN: 14.4 g/dL (ref 11.7–15.5)
MCH: 32.1 pg (ref 27.0–33.0)
MCHC: 34.4 g/dL (ref 32.0–36.0)
MCV: 93.3 fL (ref 80.0–100.0)
MPV: 9.5 fL (ref 7.5–12.5)
Platelets: 160 10*3/uL (ref 140–400)
RBC: 4.49 10*6/uL (ref 3.80–5.10)
RDW: 11.7 % (ref 11.0–15.0)
WBC: 6.4 10*3/uL (ref 3.8–10.8)

## 2018-01-07 LAB — LIPID PANEL
CHOL/HDL RATIO: 2.5 (calc) (ref <5.0)
CHOLESTEROL: 194 mg/dL (ref <200)
HDL: 78 mg/dL (ref >50)
LDL CHOLESTEROL (CALC): 99 mg/dL
NON-HDL CHOLESTEROL (CALC): 116 mg/dL (ref <130)
Triglycerides: 77 mg/dL (ref <150)

## 2018-01-07 NOTE — Telephone Encounter (Signed)
I called the pt to schedule AWV-S at [facility] clinic on afternoon of 01/16/18.  There was no answer and no option to leave a message. VDM (DD)

## 2018-01-14 ENCOUNTER — Other Ambulatory Visit: Payer: Self-pay | Admitting: Family Medicine

## 2018-01-14 ENCOUNTER — Encounter: Payer: Self-pay | Admitting: Internal Medicine

## 2018-01-14 ENCOUNTER — Non-Acute Institutional Stay: Payer: Medicare Other | Admitting: Internal Medicine

## 2018-01-14 VITALS — BP 118/76 | HR 64 | Temp 97.4°F | Resp 16 | Ht 62.0 in | Wt 117.6 lb

## 2018-01-14 DIAGNOSIS — M810 Age-related osteoporosis without current pathological fracture: Secondary | ICD-10-CM | POA: Diagnosis not present

## 2018-01-14 DIAGNOSIS — E785 Hyperlipidemia, unspecified: Secondary | ICD-10-CM

## 2018-01-14 DIAGNOSIS — I10 Essential (primary) hypertension: Secondary | ICD-10-CM | POA: Diagnosis not present

## 2018-01-14 DIAGNOSIS — I6523 Occlusion and stenosis of bilateral carotid arteries: Secondary | ICD-10-CM

## 2018-01-14 DIAGNOSIS — I2510 Atherosclerotic heart disease of native coronary artery without angina pectoris: Secondary | ICD-10-CM

## 2018-01-14 DIAGNOSIS — E041 Nontoxic single thyroid nodule: Secondary | ICD-10-CM | POA: Diagnosis not present

## 2018-01-14 DIAGNOSIS — K5900 Constipation, unspecified: Secondary | ICD-10-CM

## 2018-01-14 DIAGNOSIS — J309 Allergic rhinitis, unspecified: Secondary | ICD-10-CM | POA: Diagnosis not present

## 2018-01-14 MED ORDER — LORATADINE 10 MG PO TABS: 10.0000 mg | ORAL_TABLET | Freq: Every day | ORAL | 11 refills | Status: DC

## 2018-01-14 NOTE — Patient Instructions (Signed)
I would like for you to be seen by your heart doctor given your occasional chest tightness with known history of heart disease. If you have any chest pain please let us know right away. Take your medications as prescribed.

## 2018-01-14 NOTE — Progress Notes (Signed)
[facility]  Provider: Blanchie Serve MD   Location:  [place] of Service:  Clinic (12)  PCP: Blanchie Serve, MD Patient Care Team: Blanchie Serve, MD as PCP - General (Internal Medicine) Minus Breeding, MD (Cardiology) Clent Jacks, MD (Ophthalmology) Inda Castle, MD (Inactive) as Consulting Physician (Gastroenterology)  Extended Emergency Contact Information Primary Emergency Contact: Concepcion Elk Address: [address] [place], [place] [address] [name] of [place] Phone: [phone] Relation: Spouse   Goals of Care: Advanced Directive information Advanced Directives 01/14/2018  Does Patient Have a Medical Advance Directive? No  Type of Advance Directive -  Does patient want to make changes to medical advance directive? -  Copy of [place] in Chart? -      Chief Complaint  Patient presents with  . Medical Management of Chronic Issues    follow up    HPI: Patient is a 83 y.o. female seen today for routine visit. She had congestion in march, flonase and claritin helped, has stopped her claritin and symptoms have recurred. Tolerating her other medications well. Bowel movement has been regular. Tolerating statin well. No issues with blood pressure reported. She does complain of central chest pain with exertion while hiking few weeks back. Denies radiation of pain. Pain resolved with rest. Denies any nausea, vomiting with it, was a little out of breath as she was hiking uphill. No further pain reported. She has history of CAD and is followed by cardiology. No fall reported.   Past Medical History:  Diagnosis Date  . Allergy    seasonal  . Anemia   . Arthritis   . Benign neoplasm of descending colon 10/22/2014  . CAD (coronary artery disease)    LAD lession   . Cancer ([place])    skin  . Cataract    removed bilaterally  . Diverticulosis 11/00  . Esophagitis, acute   .  Fuch's endothelial dystrophy 04/2009   Dr, Shanon Rosser   . Gastritis 11/00  . GERD (gastroesophageal reflux disease) 11/00  . Headache   . Hemorrhoids 8/05  . Hyperlipidemia   . Hypertension   . Lung nodule 1/09  . NSVD (normal spontaneous vaginal delivery) 1967   female   . Osteopenia   . Osteoporosis   . Thyroid nodule 8/08   Past Surgical History:  Procedure Laterality Date  . bilateral cataract surgery  06/2007   DR. Groat   . bilateral cataract surgery  10/08   Dr. Katy Fitch   . cardiac cath 40-50%LAD  6/07  . cardiac cath with angioplasty & stent replacement  4/98  . COLONOSCOPY    . CRANIOTOMY Left 05/13/2015   Procedure: Left frontotemporal Craniotomy for Meningioma resection;  Surgeon: Consuella Lose, MD;  Location: [facility];  Service: Neurosurgery;  Laterality: Left;  . CT LUNG SCREENING  6/07  . FACIAL COSMETIC SURGERY  7/96   Dr. Mikle Bosworth    . MENISCUS REPAIR      reports that she has quit smoking. Her smoking use included cigarettes. She has a 13.50 pack-year smoking history. She has never used smokeless tobacco. She reports that she does not drink alcohol or use drugs. Social History   Socioeconomic History  . Marital status: Married    Spouse name: Juanda Crumble  . Number of children: 1  . Years of education: 4  . Highest education level: Not on file  Occupational History  . Occupation: Retired Tour manager  . Financial resource strain: Not on file  . Food insecurity:    Worry: Not on file    Inability: Not on file  . Transportation needs:    Medical: Not on file    Non-medical: Not on file  Tobacco Use  . Smoking status: Former Smoker    Packs/day: 0.50    Years: 27.00    Pack years: 13.50    Types: Cigarettes  . Smokeless tobacco: Never Used  Substance and Sexual Activity  . Alcohol use: No    Alcohol/week: 0.0 oz  . Drug use: No  . Sexual activity: Never  Lifestyle  . Physical activity:    Days per week: Not on file    Minutes per session:  Not on file  . Stress: Not on file  Relationships  . Social connections:    Talks on phone: Not on file    Gets together: Not on file    Attends religious service: Not on file    Active member of club or organization: Not on file    Attends meetings of clubs or organizations: Not on file    Relationship status: Not on file  . Intimate partner violence:    Fear of current or ex partner: Not on file    Emotionally abused: Not on file    Physically abused: Not on file    Forced sexual activity: Not on file  Other Topics Concern  . Not on file  Social History Narrative   Married to [place] in 1961   Lives in [place] at [facility]   One child, Santiago Glad   No pets   Caffeine   Exercises (water aerobics, walking, chair)   Living Will, HCPOA, DNR   Glasses   Hearing Aids    Functional Status Survey:    Family History  Problem Relation Age of Onset  . Breast cancer Sister 12       lumpectomy  . Heart disease Sister   . Stroke Sister   . GER disease Sister   . Dementia Sister   . Hepatitis Brother   . Heart attack Brother   . Stroke Brother   . Diabetes Mother   . Hypertension Mother   . Diabetes Father   . Hypertension Father   . Alzheimer's disease Sister   . Colon cancer Neg Hx     Health Maintenance  Topic Date Due  . PNA vac Low Risk Adult (2 of 2 - PPSV23) 08/21/2014  . INFLUENZA VACCINE  05/02/2018  . DEXA SCAN  07/03/2018  . MAMMOGRAM  12/28/2018  . TETANUS/TDAP  04/01/2021    Allergies  Allergen Reactions  . Sulfonamide Derivatives   . K+ Care [Potassium Chloride] Diarrhea  . Lipitor [Atorvastatin Calcium]     Myalgias    . Sulfa Antibiotics Rash    Fever blisters    Outpatient Encounter Medications as of 01/14/2018  Medication Sig  . aspirin EC 81 MG tablet Take 81 mg by mouth daily.  . Calcium-Vitamin D-Vitamin K (CALCIUM + D) [phone] MG-UNT-MCG CHEW Chew 1 tablet by mouth daily.  . Cholecalciferol (VITAMIN D3) 1000  UNITS CAPS Take 2,000 Units by mouth daily.   . CO ENZYME Q-10 PO Take 1 capsule by mouth daily.   . colesevelam (WELCHOL) 625 MG tablet Take 3 tablets (1,875 mg total) by mouth 2 (two) times daily with a meal.  . fluticasone (FLONASE) 50  MCG/ACT nasal spray USE 1 TO 2 SPRAYS IN EACH  NOSTRIL DAILY AS DIRECTED  FOR ALLERGIC RHINITIS  . hydrochlorothiazide (MICROZIDE) 12.5 MG capsule TAKE 1 CAPSULE DAILY  . IRON PO Take 1 tablet by mouth every other day.   Marland Kitchen L-Lysine 1000 MG TABS Take 1,000 mg by mouth daily.  . magnesium oxide (MAG-OX) 400 MG tablet Take 400 mg by mouth daily.  . Multiple Vitamin (MULTI-VITAMIN PO) Take 1 tablet by mouth daily.   Marland Kitchen omega-3 acid ethyl esters (LOVAZA) 1 g capsule TAKE 2 CAPSULES (=2GM)     TWICE DAILY  . polyethylene glycol powder (GLYCOLAX/MIRALAX) powder Take 17 g by mouth daily as needed for moderate constipation.  . pravastatin (PRAVACHOL) 80 MG tablet TAKE 1 TABLET DAILY  . ranitidine (ZANTAC) 150 MG tablet Take 150 mg by mouth daily.  . sodium chloride (MURO 128) 5 % ophthalmic ointment Place 1 application into both eyes 4 (four) times daily.  Marland Kitchen acyclovir ointment (ZOVIRAX) 5 % Apply 1 application topically every 3 (three) hours. Apply frequently and sparingly to cold sores as needed. (Patient not taking: Reported on 01/14/2018)  . denosumab (PROLIA) 60 MG/ML SOLN injection Inject 60 mg into the skin every 6 (six) months. Administer in upper arm, thigh, or abdomen  . loratadine (CLARITIN) 10 MG tablet Take 1 tablet (10 mg total) by mouth daily. (Patient not taking: Reported on 01/14/2018)  . valACYclovir (VALTREX) 1000 MG tablet Take 1,000 mg by mouth as needed (for fever blisters).   No facility-administered encounter medications on file as of 01/14/2018.     Review of Systems  Constitutional: Negative for appetite change, chills, fatigue and fever.  HENT: Positive for congestion, hearing loss, postnasal drip and rhinorrhea. Negative for ear pain, mouth  sores, sinus pressure, sore throat and trouble swallowing.   Eyes: Positive for visual disturbance.  Respiratory: Negative for cough, choking and shortness of breath.   Cardiovascular: Negative for chest pain, palpitations and leg swelling.  Gastrointestinal: Negative for abdominal pain, blood in stool, constipation, diarrhea, nausea and vomiting.  Genitourinary: Positive for frequency. Negative for dysuria, hematuria and pelvic pain.       Has increased urinary frequency (chronic), denies nocturia  Musculoskeletal: Negative for back pain and gait problem.       Had a fall few weeks back while hiking, no injury reported.  Skin: Negative for rash.  Neurological: Negative for dizziness, seizures, syncope, numbness and headaches.  Hematological: Bruises/bleeds easily.  Psychiatric/Behavioral: Negative for behavioral problems, confusion and suicidal ideas.    Vitals:   01/14/18 1526  BP: 118/76  Pulse: 64  Resp: 16  Temp: (!) 97.4 F (36.3 C)  TempSrc: Oral  SpO2: 91%  Weight: 117 lb 9.6 oz (53.3 kg)  Height: '5\' 2"'  (1.575 m)   Body mass index is 21.51 kg/m.   Wt Readings from Last 3 Encounters:  01/14/18 117 lb 9.6 oz (53.3 kg)  12/11/17 118 lb 9.6 oz (53.8 kg)  09/17/17 117 lb 9.6 oz (53.3 kg)   Physical Exam  Constitutional: She is oriented to person, place, and time. She appears well-developed and well-nourished. No distress.  HENT:  Head: Normocephalic and atraumatic.  Right Ear: External ear normal.  Left Ear: External ear normal.  Mouth/Throat: Oropharynx is clear and moist. No oropharyngeal exudate.  Mucosal edema of nostrils. Hearing aids present  Eyes: Pupils are equal, round, and reactive to light. Conjunctivae and EOM are normal. Right eye exhibits no discharge. Left eye exhibits no discharge.  Neck: Normal range of motion. Neck supple.  Cardiovascular: Normal rate, regular rhythm and intact distal pulses.  Pulmonary/Chest: Effort normal and breath sounds normal.  She has no wheezes. She has no rales.  Abdominal: Soft. Bowel sounds are normal. There is no tenderness. There is no guarding.  Musculoskeletal: She exhibits no edema.  Lymphadenopathy:    She has no cervical adenopathy.  Neurological: She is alert and oriented to person, place, and time.  Skin: Skin is warm and dry. No rash noted. She is not diaphoretic.  Psychiatric: She has a normal mood and affect. Her behavior is normal.    Labs reviewed: Basic Metabolic Panel: Recent Labs    05/02/17 0944 01/07/18 0750  NA 142 141  K 4.3 4.0  CL 101 103  CO2 27 32  GLUCOSE 87 78  BUN 15 19  CREATININE 0.97 0.98*  CALCIUM 9.7 9.8   Liver Function Tests: Recent Labs    05/02/17 0944 01/07/18 0750  AST 30 31  ALT 15 13  ALKPHOS 39  --   BILITOT 0.6 0.7  PROT 6.6 6.5  ALBUMIN 4.4  --    No results for input(s): LIPASE, AMYLASE in the last 8760 hours. No results for input(s): AMMONIA in the last 8760 hours. CBC: Recent Labs    05/02/17 0944 01/07/18 0750  WBC 6.7 6.4  NEUTROABS 3.0  --   HGB 14.7 14.4  HCT 43.8 41.9  MCV 93 93.3  PLT 187 160   Cardiac Enzymes: No results for input(s): CKTOTAL, CKMB, CKMBINDEX, TROPONINI in the last 8760 hours. BNP: Invalid input(s): POCBNP No results found for: HGBA1C Lab Results  Component Value Date   TSH 0.862 04/14/2014   No results found for: VITAMINB12 No results found for: FOLATE No results found for: IRON, TIBC, FERRITIN  Lipid Panel: Recent Labs    05/02/17 0944 01/07/18 0750  CHOL 178 194  HDL 94 78  LDLCALC 73 99  TRIG 57 77  CHOLHDL 1.9 2.5   No results found for: HGBA1C  Procedures since last visit: Mm Screening Breast Tomo Bilateral  Result Date: 12/28/2017 CLINICAL DATA:  Screening. EXAM: DIGITAL SCREENING BILATERAL MAMMOGRAM WITH TOMO AND CAD COMPARISON:  Previous exam(s). ACR Breast Density Category c: The breast tissue is heterogeneously dense, which may obscure small masses. FINDINGS: There are no  findings suspicious for malignancy. Images were processed with CAD. IMPRESSION: No mammographic evidence of malignancy. A result letter of this screening mammogram will be mailed directly to the patient. RECOMMENDATION: Screening mammogram in one year. (Code:SM-B-01Y) BI-RADS CATEGORY  1: Negative. Electronically Signed   By: Nolon Nations M.D.   On: 12/28/2017 10:03    Assessment/Plan  1. Allergic rhinitis, unspecified seasonality, unspecified trigger Continue flonase, advised to resume claritin  2. Coronary artery disease involving native coronary artery of native heart without angina pectoris Chest pain free at present. Had episode of chest pain 3-4 weeks back while hiking. Denies any further episodes. Advised to see cardiology with concern for stable angina that is relieved by rest. Patient is not on any b blocker or ACEI or imdur. Not on prn NTG. Last stress test in 2013. Also advised to contact our office if has any further chest pain. Continue her aspirin, statin and ranitidine. Pt agrees to make appt with cardiology ASAP. She has history of GERD and her one episode could have been from reflux as well. Continue ranitidine - CMP with eGFR([facility]); Future - Lipid Panel; Future - CBC (no diff); Future -  TSH; Future  3. Essential hypertension Continue HCTZ, labs as below - CMP with eGFR([facility]); Future - Lipid Panel; Future - CBC (no diff); Future  4. Hyperlipidemia, unspecified hyperlipidemia type Continue statin - CMP with eGFR([facility]); Future - Lipid Panel; Future - CBC (no diff); Future  5. Constipation, unspecified constipation type Hydration and prn miralax  6. THYROID NODULE Symptom free - CBC (no diff); Future - TSH; Future  7. Osteoporosis, postmenopausal Continue prolia injection and calcium and vit d supplement - CBC (no diff); Future - Vitamin D, 25-hydroxy; Future   Labs/tests ordered:   Lab Orders     CMP with eGFR([facility])     Lipid Panel     CBC (no  diff)     TSH     Vitamin D, 25-hydroxy   Next appointment: 6 months or earlier if needed  Communication: reviewed care plan with patient    Blanchie Serve, MD Internal Medicine [place], [place] [address] Cell Phone (Monday-Friday 8 am - 5 pm): [phone] On Call: [phone] and follow prompts after 5 pm and on weekends Office Phone: [phone] Office Fax: [phone]

## 2018-01-17 ENCOUNTER — Encounter: Payer: Medicare Other | Admitting: *Deleted

## 2018-01-17 ENCOUNTER — Ambulatory Visit: Payer: Medicare Other | Admitting: Internal Medicine

## 2018-01-17 DIAGNOSIS — M810 Age-related osteoporosis without current pathological fracture: Secondary | ICD-10-CM

## 2018-01-17 MED ORDER — DENOSUMAB 60 MG/ML ~~LOC~~ SOSY
60.0000 mg | PREFILLED_SYRINGE | Freq: Once | SUBCUTANEOUS | Status: AC
  Administered 2018-01-17: 60 mg via SUBCUTANEOUS

## 2018-01-17 NOTE — Progress Notes (Signed)
Prolia injection administered to patient.

## 2018-01-17 NOTE — Progress Notes (Signed)
Opened in error

## 2018-01-22 DIAGNOSIS — D352 Benign neoplasm of pituitary gland: Secondary | ICD-10-CM | POA: Diagnosis not present

## 2018-01-22 DIAGNOSIS — D329 Benign neoplasm of meninges, unspecified: Secondary | ICD-10-CM | POA: Diagnosis not present

## 2018-01-25 ENCOUNTER — Other Ambulatory Visit: Payer: Self-pay | Admitting: *Deleted

## 2018-01-25 MED ORDER — OMEGA-3-ACID ETHYL ESTERS 1 G PO CAPS: ORAL_CAPSULE | ORAL | 1 refills | Status: DC

## 2018-01-25 MED ORDER — COENZYME Q10 30 MG PO CAPS: 30.0000 mg | ORAL_CAPSULE | Freq: Every day | ORAL | 1 refills | Status: AC

## 2018-01-25 MED ORDER — PRAVASTATIN SODIUM 80 MG PO TABS: 80.0000 mg | ORAL_TABLET | Freq: Every day | ORAL | 1 refills | Status: DC

## 2018-01-25 MED ORDER — FLUTICASONE PROPIONATE 50 MCG/ACT NA SUSP: NASAL | 1 refills | Status: DC

## 2018-01-25 MED ORDER — HYDROCHLOROTHIAZIDE 12.5 MG PO CAPS: 12.5000 mg | ORAL_CAPSULE | Freq: Every day | ORAL | 1 refills | Status: DC

## 2018-01-31 NOTE — Telephone Encounter (Signed)
I left a message with a female asking him to have the patient call me at [phone] to schedule AWV at [facility] clinic tomorrow afternoon if she's available. VDM (DD)

## 2018-02-01 ENCOUNTER — Non-Acute Institutional Stay: Payer: Medicare Other

## 2018-02-01 DIAGNOSIS — Z0000 Encounter for general adult medical examination without abnormal findings: Secondary | ICD-10-CM

## 2018-02-01 MED ORDER — ZOSTER VAC RECOMB ADJUVANTED 50 MCG/0.5ML IM SUSR: 0.5000 mL | Freq: Once | INTRAMUSCULAR | 1 refills | Status: AC

## 2018-02-01 MED ORDER — COLESEVELAM HCL 625 MG PO TABS: 1875.0000 mg | ORAL_TABLET | Freq: Two times a day (BID) | ORAL | 3 refills | Status: DC

## 2018-02-01 NOTE — Patient Instructions (Signed)
Yolanda [name] , Thank you for taking time to come for your Medicare Wellness Visit. I appreciate your ongoing commitment to your health goals. Please review the following plan we discussed and let me know if I can assist you in the future.   Screening recommendations/referrals: Colonoscopy excluded Mammogram excluded Bone Density up to date Recommended yearly ophthalmology/optometry visit for glaucoma screening and checkup Recommended yearly dental visit for hygiene and checkup  Vaccinations: Influenza vaccine up to date, due 2019 fall season Pneumococcal vaccine up to date, completed Tdap vaccine up to date, due 04/02/2011 Shingles vaccine due-prescription sent to [facility]    Advanced directives: Please bring [place] a copy of your living will and health care power of attorney  Conditions/risks identified: none  Next appointment: Dr. Bubba Camp 07/22/2018 @ 2pm   Preventive Care 81 Years and Older, Female Preventive care refers to lifestyle choices and visits with your health care provider that can promote health and wellness. What does preventive care include?  A yearly physical exam. This is also called an annual well check.  Dental exams once or twice a year.  Routine eye exams. Ask your health care provider how often you should have your eyes checked.  Personal lifestyle choices, including:  Daily care of your teeth and gums.  Regular physical activity.  Eating a healthy diet.  Avoiding tobacco and drug use.  Limiting alcohol use.  Practicing safe sex.  Taking low-dose aspirin every day.  Taking vitamin and mineral supplements as recommended by your health care provider. What happens during an annual well check? The services and screenings done by your health care provider during your annual well check will depend on your age, overall health, lifestyle risk factors, and family history of disease. Counseling  Your health care provider may ask you questions about  your:  Alcohol use.  Tobacco use.  Drug use.  Emotional well-being.  Home and relationship well-being.  Sexual activity.  Eating habits.  History of falls.  Memory and ability to understand (cognition).  Work and work Statistician.  Reproductive health. Screening  You may have the following tests or measurements:  Height, weight, and BMI.  Blood pressure.  Lipid and cholesterol levels. These may be checked every 5 years, or more frequently if you are over 81 years old.  Skin check.  Lung cancer screening. You may have this screening every year starting at age 81 if you have a 30-pack-year history of smoking and currently smoke or have quit within the past 15 years.  Fecal occult blood test (FOBT) of the stool. You may have this test every year starting at age 81.  Flexible sigmoidoscopy or colonoscopy. You may have a sigmoidoscopy every 5 years or a colonoscopy every 10 years starting at age 81.  Hepatitis C blood test.  Hepatitis B blood test.  Sexually transmitted disease (STD) testing.  Diabetes screening. This is done by checking your blood sugar (glucose) after you have not eaten for a while (fasting). You may have this done every 1-3 years.  Bone density scan. This is done to screen for osteoporosis. You may have this done starting at age 81.  Mammogram. This may be done every 1-2 years. Talk to your health care provider about how often you should have regular mammograms. Talk with your health care provider about your test results, treatment options, and if necessary, the need for more tests. Vaccines  Your health care provider may recommend certain vaccines, such as:  Influenza vaccine. This is recommended  every year.  Tetanus, diphtheria, and acellular pertussis (Tdap, Td) vaccine. You may need a Td booster every 10 years.  Zoster vaccine. You may need this after age 50.  Pneumococcal 13-valent conjugate (PCV13) vaccine. One dose is recommended  after age 81.  Pneumococcal polysaccharide (PPSV23) vaccine. One dose is recommended after age 81. Talk to your health care provider about which screenings and vaccines you need and how often you need them. This information is not intended to replace advice given to you by your health care provider. Make sure you discuss any questions you have with your health care provider. Document Released: 10/15/2015 Document Revised: 06/07/2016 Document Reviewed: 07/20/2015 Elsevier Interactive Patient Education  2017 [place] Prevention in the Home Falls can cause injuries. They can happen to people of all ages. There are many things you can do to make your home safe and to help prevent falls. What can I do on the outside of my home?  Regularly fix the edges of walkways and driveways and fix any cracks.  Remove anything that might make you trip as you walk through a door, such as a raised step or threshold.  Trim any bushes or trees on the path to your home.  Use bright outdoor lighting.  Clear any walking paths of anything that might make someone trip, such as rocks or tools.  Regularly check to see if handrails are loose or broken. Make sure that both sides of any steps have handrails.  Any raised decks and porches should have guardrails on the edges.  Have any leaves, snow, or ice cleared regularly.  Use sand or salt on walking paths during winter.  Clean up any spills in your garage right away. This includes oil or grease spills. What can I do in the bathroom?  Use night lights.  Install grab bars by the toilet and in the tub and shower. Do not use towel bars as grab bars.  Use non-skid mats or decals in the tub or shower.  If you need to sit down in the shower, use a plastic, non-slip stool.  Keep the floor dry. Clean up any water that spills on the floor as soon as it happens.  Remove soap buildup in the tub or shower regularly.  Attach bath mats securely with  double-sided non-slip rug tape.  Do not have throw rugs and other things on the floor that can make you trip. What can I do in the bedroom?  Use night lights.  Make sure that you have a light by your bed that is easy to reach.  Do not use any sheets or blankets that are too big for your bed. They should not hang down onto the floor.  Have a firm chair that has side arms. You can use this for support while you get dressed.  Do not have throw rugs and other things on the floor that can make you trip. What can I do in the kitchen?  Clean up any spills right away.  Avoid walking on wet floors.  Keep items that you use a lot in easy-to-reach places.  If you need to reach something above you, use a strong step stool that has a grab bar.  Keep electrical cords out of the way.  Do not use floor polish or wax that makes floors slippery. If you must use wax, use non-skid floor wax.  Do not have throw rugs and other things on the floor that can make you trip. What  can I do with my stairs?  Do not leave any items on the stairs.  Make sure that there are handrails on both sides of the stairs and use them. Fix handrails that are broken or loose. Make sure that handrails are as long as the stairways.  Check any carpeting to make sure that it is firmly attached to the stairs. Fix any carpet that is loose or worn.  Avoid having throw rugs at the top or bottom of the stairs. If you do have throw rugs, attach them to the floor with carpet tape.  Make sure that you have a light switch at the top of the stairs and the bottom of the stairs. If you do not have them, ask someone to add them for you. What else can I do to help prevent falls?  Wear shoes that:  Do not have high heels.  Have rubber bottoms.  Are comfortable and fit you well.  Are closed at the toe. Do not wear sandals.  If you use a stepladder:  Make sure that it is fully opened. Do not climb a closed stepladder.  Make  sure that both sides of the stepladder are locked into place.  Ask someone to hold it for you, if possible.  Clearly mark and make sure that you can see:  Any grab bars or handrails.  First and last steps.  Where the edge of each step is.  Use tools that help you move around (mobility aids) if they are needed. These include:  Canes.  Walkers.  Scooters.  Crutches.  Turn on the lights when you go into a dark area. Replace any light bulbs as soon as they burn out.  Set up your furniture so you have a clear path. Avoid moving your furniture around.  If any of your floors are uneven, fix them.  If there are any pets around you, be aware of where they are.  Review your medicines with your doctor. Some medicines can make you feel dizzy. This can increase your chance of falling. Ask your doctor what other things that you can do to help prevent falls. This information is not intended to replace advice given to you by your health care provider. Make sure you discuss any questions you have with your health care provider. Document Released: 07/15/2009 Document Revised: 02/24/2016 Document Reviewed: 10/23/2014 Elsevier Interactive Patient Education  2017 [employer].

## 2018-02-01 NOTE — Progress Notes (Signed)
Subjective:   Yolanda [name] is a 83 y.o. female who presents for Medicare Annual (Subsequent) preventive examination at [place] [facility]-12/15/2015    Objective:     Vitals: BP 140/76 (BP Location: Left Arm, Patient Position: Sitting)   Pulse 71   Temp (!) 97.4 F (36.3 C) (Oral)   Ht 5\' 2"  (1.575 m)   Wt 118 lb (53.5 kg)   SpO2 96%   BMI 21.58 kg/m   Body mass index is 21.58 kg/m.  Advanced Directives 02/01/2018 01/14/2018 12/11/2017 12/15/2015 05/13/2015 05/06/2015 10/22/2014  Does Patient Have a Medical Advance Directive? Yes No No Yes Yes Yes Yes  Type of Paramedic of [place];Living will - - [place];Living will [place];Living will [place];Living will [place];Living will  Does patient want to make changes to medical advance directive? No - Patient declined - - No - Patient declined - No - Patient declined -  Copy of [place] in Chart? No - copy requested - - No - copy requested Yes Yes -    Tobacco Social History   Tobacco Use  Smoking Status Former Smoker  . Packs/day: 0.50  . Years: 27.00  . Pack years: 13.50  . Types: Cigarettes  Smokeless Tobacco Never Used     Counseling given: Not Answered   Clinical Intake:  Pre-visit preparation completed: No  Pain : No/denies pain     Nutritional Risks: None Diabetes: No  How often do you need to have someone help you when you read instructions, pamphlets, or other written materials from your doctor or pharmacy?: 1 - Never What is the last grade level you completed in school?: Bachelors  Interpreter Needed?: No  Information entered by :: Tyson Dense, RN  Past Medical History:  Diagnosis Date  . Allergy    seasonal  . Anemia   . Arthritis   . Benign neoplasm of descending colon 10/22/2014  . CAD (coronary artery disease)    LAD lession   . Cancer ([place])     skin  . Cataract    removed bilaterally  . Diverticulosis 11/00  . Esophagitis, acute   . Fuch's endothelial dystrophy 04/2009   Dr, Shanon Rosser   . Gastritis 11/00  . GERD (gastroesophageal reflux disease) 11/00  . Headache   . Hemorrhoids 8/05  . Hyperlipidemia   . Hypertension   . Lung nodule 1/09  . NSVD (normal spontaneous vaginal delivery) 1967   female   . Osteopenia   . Osteoporosis   . Thyroid nodule 8/08   Past Surgical History:  Procedure Laterality Date  . bilateral cataract surgery  06/2007   DR. Groat   . bilateral cataract surgery  10/08   Dr. Katy Fitch   . cardiac cath 40-50%LAD  6/07  . cardiac cath with angioplasty & stent replacement  4/98  . COLONOSCOPY    . CRANIOTOMY Left 05/13/2015   Procedure: Left frontotemporal Craniotomy for Meningioma resection;  Surgeon: Consuella Lose, MD;  Location: [facility];  Service: Neurosurgery;  Laterality: Left;  . CT LUNG SCREENING  6/07  . FACIAL COSMETIC SURGERY  7/96   Dr. Mikle Bosworth    . MENISCUS REPAIR     Family History  Problem Relation Age of Onset  . Breast cancer Sister 40       lumpectomy  . Heart disease Sister   . Stroke Sister   .  GER disease Sister   . Dementia Sister   . Hepatitis Brother   . Heart attack Brother   . Stroke Brother   . Diabetes Mother   . Hypertension Mother   . Diabetes Father   . Hypertension Father   . Alzheimer's disease Sister   . Colon cancer Neg Hx    Social History   Socioeconomic History  . Marital status: Married    Spouse name: Juanda Crumble  . Number of children: 1  . Years of education: 4  . Highest education level: Not on file  Occupational History  . Occupation: Retired Tour manager  . Financial resource strain: Not hard at all  . Food insecurity:    Worry: Never true    Inability: Never true  . Transportation needs:    Medical: No    Non-medical: No  Tobacco Use  . Smoking status: Former Smoker    Packs/day: 0.50    Years: 27.00    Pack  years: 13.50    Types: Cigarettes  . Smokeless tobacco: Never Used  Substance and Sexual Activity  . Alcohol use: No    Alcohol/week: 0.0 oz  . Drug use: No  . Sexual activity: Never  Lifestyle  . Physical activity:    Days per week: 5 days    Minutes per session: 40 min  . Stress: Only a little  Relationships  . Social connections:    Talks on phone: Twice a week    Gets together: Twice a week    Attends religious service: More than 4 times per year    Active member of club or organization: No    Attends meetings of clubs or organizations: Never    Relationship status: Married  Other Topics Concern  . Not on file  Social History Narrative   Married to [place] in [place] in [place] at [facility]   One child, Santiago Glad   No pets   Caffeine   Exercises (water aerobics, walking, chair)   Living Will, HCPOA, DNR   Glasses   Hearing Aids    Outpatient Encounter Medications as of 02/01/2018  Medication Sig  . aspirin EC 81 MG tablet Take 81 mg by mouth daily.  . Calcium-Vitamin D-Vitamin K (CALCIUM + D) [phone] MG-UNT-MCG CHEW Chew 1 tablet by mouth daily.  . Cholecalciferol (VITAMIN D3) 1000 UNITS CAPS Take 2,000 Units by mouth daily.   Marland Kitchen co-enzyme Q-10 30 MG capsule Take 1 capsule (30 mg total) by mouth daily.  . colesevelam (WELCHOL) 625 MG tablet Take 3 tablets (1,875 mg total) by mouth 2 (two) times daily with a meal.  . denosumab (PROLIA) 60 MG/ML SOLN injection Inject 60 mg into the skin every 6 (six) months. Administer in upper arm, thigh, or abdomen  . fluticasone (FLONASE) 50 MCG/ACT nasal spray USE 1 TO 2 SPRAYS IN EACH  NOSTRIL DAILY AS DIRECTED  FOR ALLERGIC RHINITIS  . hydrochlorothiazide (MICROZIDE) 12.5 MG capsule Take 1 capsule (12.5 mg total) by mouth daily.  Marland Kitchen L-Lysine 1000 MG TABS Take 1,000 mg by mouth daily.  Marland Kitchen loratadine (CLARITIN) 10 MG tablet Take 1 tablet (10 mg total) by mouth daily.  . magnesium oxide (MAG-OX) 400 MG tablet  Take 400 mg by mouth daily.  . Multiple Vitamin (MULTI-VITAMIN PO) Take 1 tablet by mouth daily.   Marland Kitchen omega-3 acid ethyl esters (LOVAZA) 1 g capsule TAKE 2 CAPSULES (=2GM)     TWICE DAILY  .  polyethylene glycol powder (GLYCOLAX/MIRALAX) powder Take 17 g by mouth daily as needed for moderate constipation.  . pravastatin (PRAVACHOL) 80 MG tablet Take 1 tablet (80 mg total) by mouth daily.  . ranitidine (ZANTAC) 150 MG tablet Take 150 mg by mouth daily.  . sodium chloride (MURO 128) 5 % ophthalmic ointment Place 1 application into both eyes 4 (four) times daily.  . valACYclovir (VALTREX) 1000 MG tablet Take 1,000 mg by mouth as needed (for fever blisters).  . Zoster Vaccine Adjuvanted [facility]) injection Inject 0.5 mLs into the muscle once for 1 dose.  . [DISCONTINUED] colesevelam (WELCHOL) 625 MG tablet Take 3 tablets (1,875 mg total) by mouth 2 (two) times daily with a meal.  . [DISCONTINUED] Zoster Vaccine Adjuvanted [facility]) injection Inject 0.5 mLs into the muscle once.  Marland Kitchen acyclovir ointment (ZOVIRAX) 5 % Apply 1 application topically every 3 (three) hours. Apply frequently and sparingly to cold sores as needed. (Patient not taking: Reported on 01/14/2018)   No facility-administered encounter medications on file as of 02/01/2018.     Activities of Daily Living In your present state of health, do you have any difficulty performing the following activities: 02/01/2018  Hearing? N  Vision? Y  Difficulty concentrating or making decisions? N  Walking or climbing stairs? N  Dressing or bathing? N  Doing errands, shopping? N  Preparing Food and eating ? N  Using the Toilet? N  In the past six months, have you accidently leaked urine? Y  Do you have problems with loss of bowel control? N  Managing your Medications? N  Managing your Finances? N  Housekeeping or managing your Housekeeping? N  Some recent data might be hidden    Patient Care Team: Blanchie Serve, MD as PCP - General (Internal  Medicine) Minus Breeding, MD (Cardiology) Clent Jacks, MD (Ophthalmology) Inda Castle, MD (Inactive) as Consulting Physician (Gastroenterology)    Assessment:   This is a routine wellness examination for [place].  Exercise Activities and Dietary recommendations Current Exercise Habits: Home exercise routine, Type of exercise: walking;strength training/weights, Time (Minutes): 40, Frequency (Times/Week): 3, Weekly Exercise (Minutes/Week): 120, Intensity: Mild, Exercise limited by: None identified  Goals    None      Fall Risk Fall Risk  02/01/2018 05/02/2017 12/20/2016 07/25/2016 03/08/2016  Falls in the past year? Yes No No No No  Number falls in past yr: 2 or more - - - -  Comment - - - - -  Injury with Fall? No - - - -  Comment - - - - -  Risk for fall due to : - - - - -   Is the patient's home free of loose throw rugs in walkways, pet beds, electrical cords, etc?   yes      Grab bars in the bathroom? yes      Handrails on the stairs?   yes      Adequate lighting?   yes  Timed Get Up and Go performed: 20 seconds, fall risk  Depression Screen PHQ 2/9 Scores 02/01/2018 05/02/2017 12/20/2016 07/25/2016  PHQ - 2 Score 0 0 0 0     Cognitive Function MMSE - Mini Mental State Exam 02/01/2018 12/15/2015  Orientation to time 5 5  Orientation to Place 5 5  Registration 3 3  Attention/ Calculation 5 5  Recall 2 3  Language- name 2 objects 2 2  Language- repeat 1 1  Language- follow 3 step command 3 3  Language- read & follow direction  1 1  Write a sentence 1 1  Copy design 1 1  Total score 29 30        Immunization History  Administered Date(s) Administered  . Influenza, High Dose Seasonal PF 07/12/2015, 06/27/2016, 07/02/2017  . Influenza-Unspecified 07/02/2017  . Pneumococcal Conjugate-13 08/21/2013    Qualifies for Shingles Vaccine? Yes, educated and prescription sent to pharmacy  Screening Tests Health Maintenance  Topic Date Due  . PNA vac Low Risk Adult (2 of 2  - PPSV23) 08/21/2014  . INFLUENZA VACCINE  05/02/2018  . DEXA SCAN  07/03/2018  . MAMMOGRAM  12/28/2018  . TETANUS/TDAP  04/01/2021    Cancer Screenings: Lung: Low Dose CT Chest recommended if Age 67-80 years, 30 pack-year currently smoking OR have quit w/in 15years. Patient does not qualify. Breast:  Up to date on Mammogram? Yes   Up to date of Bone Density/Dexa? Yes Colorectal: up to date  Additional Screenings:  Hepatitis C Screening: declined     Plan:    I have personally reviewed and addressed the Medicare Annual Wellness questionnaire and have noted the following in the patient's chart:  A. Medical and social history B. Use of alcohol, tobacco or illicit drugs  C. Current medications and supplements D. Functional ability and status E.  Nutritional status F.  Physical activity G. Advance directives H. List of other physicians I.  Hospitalizations, surgeries, and ER visits in previous 12 months J.  [place] to include hearing, vision, cognitive, depression L. Referrals and appointments - none  In addition, I have reviewed and discussed with patient certain preventive protocols, quality metrics, and best practice recommendations. A written personalized care plan for preventive services as well as general preventive health recommendations were provided to patient.  See attached scanned questionnaire for additional information.   Signed,   Tyson Dense, RN Nurse Health Advisor  Patient Concerns: None

## 2018-02-04 DIAGNOSIS — H469 Unspecified optic neuritis: Secondary | ICD-10-CM | POA: Diagnosis not present

## 2018-02-04 DIAGNOSIS — D329 Benign neoplasm of meninges, unspecified: Secondary | ICD-10-CM | POA: Diagnosis not present

## 2018-02-04 DIAGNOSIS — I10 Essential (primary) hypertension: Secondary | ICD-10-CM | POA: Diagnosis not present

## 2018-02-12 ENCOUNTER — Encounter: Payer: Self-pay | Admitting: Family

## 2018-03-10 NOTE — Progress Notes (Signed)
HPI The patient presents for follow up of CAD.  Since I last saw her she has done well.  The patient denies any new symptoms such as chest discomfort, neck or arm discomfort. There has been no new shortness of breath, PND or orthopnea. There have been no reported palpitations, presyncope or syncope.  She does exercises at [facility] including working the pool.    Allergies  Allergen Reactions  . Sulfonamide Derivatives   . K+ Care [Potassium Chloride] Diarrhea  . Lipitor [Atorvastatin Calcium]     Myalgias    . Sulfa Antibiotics Rash    Fever blisters    Current Outpatient Medications  Medication Sig Dispense Refill  . aspirin EC 81 MG tablet Take 81 mg by mouth daily.    . Calcium-Vitamin D-Vitamin K (CALCIUM + D) [phone] MG-UNT-MCG CHEW Chew 1 tablet by mouth daily.    . Cholecalciferol (VITAMIN D3) 1000 UNITS CAPS Take 2,000 Units by mouth daily.     Marland Kitchen co-enzyme Q-10 30 MG capsule Take 1 capsule (30 mg total) by mouth daily. 90 capsule 1  . colesevelam (WELCHOL) 625 MG tablet Take 3 tablets (1,875 mg total) by mouth 2 (two) times daily with a meal. 540 tablet 3  . denosumab (PROLIA) 60 MG/ML SOLN injection Inject 60 mg into the skin every 6 (six) months. Administer in upper arm, thigh, or abdomen    . fluticasone (FLONASE) 50 MCG/ACT nasal spray USE 1 TO 2 SPRAYS IN EACH  NOSTRIL DAILY AS DIRECTED  FOR ALLERGIC RHINITIS 48 g 1  . hydrochlorothiazide (MICROZIDE) 12.5 MG capsule Take 1 capsule (12.5 mg total) by mouth daily. 90 capsule 1  . L-Lysine 1000 MG TABS Take 1,000 mg by mouth daily.    Marland Kitchen loratadine (CLARITIN) 10 MG tablet Take 1 tablet (10 mg total) by mouth daily. 30 tablet 11  . magnesium oxide (MAG-OX) 400 MG tablet Take 400 mg by mouth daily.    . Multiple Vitamin (MULTI-VITAMIN PO) Take 1 tablet by mouth daily.     Marland Kitchen omega-3 acid ethyl esters (LOVAZA) 1 g capsule TAKE 2 CAPSULES (=2GM)     TWICE DAILY 360 capsule 1  . polyethylene glycol powder  (GLYCOLAX/MIRALAX) powder Take 17 g by mouth daily as needed for moderate constipation. 3350 g 1  . pravastatin (PRAVACHOL) 80 MG tablet Take 1 tablet (80 mg total) by mouth daily. 90 tablet 1  . ranitidine (ZANTAC) 150 MG tablet Take 150 mg by mouth daily.    . sodium chloride (MURO 128) 5 % ophthalmic ointment Place 1 application into both eyes 4 (four) times daily.    . valACYclovir (VALTREX) 1000 MG tablet Take 1,000 mg by mouth as needed (for fever blisters).     No current facility-administered medications for this visit.     Past Medical History:  Diagnosis Date  . Allergy    seasonal  . Anemia   . Arthritis   . Benign neoplasm of descending colon 10/22/2014  . CAD (coronary artery disease)    LAD lession   . Cancer ([place])    skin  . Cataract    removed bilaterally  . Diverticulosis 11/00  . Esophagitis, acute   . Fuch's endothelial dystrophy 04/2009   Dr, Shanon Rosser   . Gastritis 11/00  . GERD (gastroesophageal reflux disease) 11/00  . Headache   . Hemorrhoids 8/05  . Hyperlipidemia   . Hypertension   . Lung nodule 1/09  . NSVD (normal spontaneous vaginal  delivery) 38   female   . Osteopenia   . Osteoporosis   . Thyroid nodule 8/08    Past Surgical History:  Procedure Laterality Date  . bilateral cataract surgery  06/2007   DR. Groat   . bilateral cataract surgery  10/08   Dr. Katy Fitch   . cardiac cath 40-50%LAD  6/07  . cardiac cath with angioplasty & stent replacement  4/98  . COLONOSCOPY    . CRANIOTOMY Left 05/13/2015   Procedure: Left frontotemporal Craniotomy for Meningioma resection;  Surgeon: Consuella Lose, MD;  Location: [facility];  Service: Neurosurgery;  Laterality: Left;  . CT LUNG SCREENING  6/07  . FACIAL COSMETIC SURGERY  7/96   Dr. Mikle Bosworth    . MENISCUS REPAIR      ROS:     As stated in the HPI and negative for all other systems.  PHYSICAL EXAM BP (!) 142/88   Pulse 70   Ht 5\' 2"  (1.575 m)   Wt 118 lb 9.6 oz (53.8 kg)   BMI 21.69  kg/m   GENERAL:  Well appearing NECK:  No jugular venous distention, waveform within normal limits, carotid upstroke brisk and symmetric, no bruits, no thyromegaly LUNGS:  Clear to auscultation bilaterally CHEST:  Unremarkable HEART:  PMI not displaced or sustained,S1 and S2 within normal limits, no S3, no S4, no clicks, no rubs, no murmurs ABD:  Flat, positive bowel sounds normal in frequency in pitch, no bruits, no rebound, no guarding, no midline pulsatile mass, no hepatomegaly, no splenomegaly EXT:  2 plus pulses throughout, no edema, no cyanosis no clubbing   EKG:  Sinus rhythm, rate 70, axis within normal limits, intervals within normal limits, non specific inferior ST T wave changes, no change from previous. 03/11/2018  Lab Results  Component Value Date   CHOL 194 01/07/2018   TRIG 77 01/07/2018   HDL 78 01/07/2018   LDLCALC 99 01/07/2018   ASSESSMENT AND PLAN  CAD, UNSPECIFIED SITE -  The patient has no new sypmtoms.  No further cardiovascular testing is indicated.  We will continue with aggressive risk reduction and meds as listed.  CAROTID STENOSIS -  She had 0-39% right stenosis and 40-59% left stenosis. I will follow this in 2020.  HYPERTENSION, UNSPECIFIED -  The blood pressure is mildly elevated but this is unusual.  No change in therapy.   HYPERLIPIDEMIA-MIXED -  This is followed by  Blanchie Serve, MD.  LDL was as above.  She will remain on the meds as listed.  The HDL/LDL ratio is excellent.

## 2018-03-11 ENCOUNTER — Encounter: Payer: Self-pay | Admitting: Cardiology

## 2018-03-11 ENCOUNTER — Ambulatory Visit (INDEPENDENT_AMBULATORY_CARE_PROVIDER_SITE_OTHER): Payer: Medicare Other | Admitting: Cardiology

## 2018-03-11 VITALS — BP 142/88 | HR 70 | Ht 62.0 in | Wt 118.6 lb

## 2018-03-11 DIAGNOSIS — I2510 Atherosclerotic heart disease of native coronary artery without angina pectoris: Secondary | ICD-10-CM

## 2018-03-11 DIAGNOSIS — I10 Essential (primary) hypertension: Secondary | ICD-10-CM | POA: Diagnosis not present

## 2018-03-11 DIAGNOSIS — E785 Hyperlipidemia, unspecified: Secondary | ICD-10-CM | POA: Diagnosis not present

## 2018-03-11 DIAGNOSIS — I6523 Occlusion and stenosis of bilateral carotid arteries: Secondary | ICD-10-CM | POA: Diagnosis not present

## 2018-03-11 NOTE — Patient Instructions (Addendum)
Medication Instructions:  Continue current medications  If you need a refill on your cardiac medications before your next appointment, please call your pharmacy.  Labwork: None Ordered  Testing/Procedures: Your physician has requested that you have a carotid duplex in 1 Year. This test is an ultrasound of the carotid arteries in your neck. It looks at blood flow through these arteries that supply the brain with blood. Allow one hour for this exam. There are no restrictions or special instructions.   Follow-Up: Your physician wants you to follow-up in: 1 Year. You should receive a reminder letter in the mail two months in advance. If you do not receive a letter, please call our office [phone].     Thank you for choosing [facility] at [facility]!!

## 2018-04-10 DIAGNOSIS — H1851 Endothelial corneal dystrophy: Secondary | ICD-10-CM | POA: Diagnosis not present

## 2018-04-10 DIAGNOSIS — D3132 Benign neoplasm of left choroid: Secondary | ICD-10-CM | POA: Diagnosis not present

## 2018-04-10 DIAGNOSIS — H04123 Dry eye syndrome of bilateral lacrimal glands: Secondary | ICD-10-CM | POA: Diagnosis not present

## 2018-04-10 DIAGNOSIS — Z961 Presence of intraocular lens: Secondary | ICD-10-CM | POA: Diagnosis not present

## 2018-04-10 DIAGNOSIS — H4720 Unspecified optic atrophy: Secondary | ICD-10-CM | POA: Diagnosis not present

## 2018-04-10 DIAGNOSIS — Z86011 Personal history of benign neoplasm of the brain: Secondary | ICD-10-CM | POA: Diagnosis not present

## 2018-04-10 DIAGNOSIS — H47292 Other optic atrophy, left eye: Secondary | ICD-10-CM | POA: Diagnosis not present

## 2018-05-15 ENCOUNTER — Encounter: Payer: Self-pay | Admitting: Internal Medicine

## 2018-05-28 ENCOUNTER — Other Ambulatory Visit: Payer: Self-pay

## 2018-05-28 DIAGNOSIS — M810 Age-related osteoporosis without current pathological fracture: Secondary | ICD-10-CM

## 2018-05-28 DIAGNOSIS — I2510 Atherosclerotic heart disease of native coronary artery without angina pectoris: Secondary | ICD-10-CM

## 2018-05-28 DIAGNOSIS — E041 Nontoxic single thyroid nodule: Secondary | ICD-10-CM

## 2018-05-28 DIAGNOSIS — E785 Hyperlipidemia, unspecified: Secondary | ICD-10-CM

## 2018-05-28 DIAGNOSIS — I10 Essential (primary) hypertension: Secondary | ICD-10-CM

## 2018-06-04 DIAGNOSIS — H1851 Endothelial corneal dystrophy: Secondary | ICD-10-CM | POA: Diagnosis not present

## 2018-06-11 ENCOUNTER — Other Ambulatory Visit: Payer: Self-pay | Admitting: *Deleted

## 2018-06-11 MED ORDER — HYDROCHLOROTHIAZIDE 12.5 MG PO CAPS: 12.5000 mg | ORAL_CAPSULE | Freq: Every day | ORAL | 1 refills | Status: DC

## 2018-06-11 NOTE — Telephone Encounter (Signed)
[facility]

## 2018-06-12 ENCOUNTER — Telehealth: Payer: Self-pay

## 2018-06-12 NOTE — Telephone Encounter (Signed)
I left a message asking that patient call the office to schedule her next prolia injection on or after 07/20/18 at [facility]. Patient will have no out of pocket cost.

## 2018-06-12 NOTE — Telephone Encounter (Signed)
Patient returned call and scheduled an appointment on 07/23/18 for a physical. She will also receive prolia injection during that visit.

## 2018-06-28 DIAGNOSIS — Z23 Encounter for immunization: Secondary | ICD-10-CM | POA: Diagnosis not present

## 2018-07-04 DIAGNOSIS — Z8719 Personal history of other diseases of the digestive system: Secondary | ICD-10-CM | POA: Diagnosis not present

## 2018-07-04 DIAGNOSIS — Z7982 Long term (current) use of aspirin: Secondary | ICD-10-CM | POA: Diagnosis not present

## 2018-07-04 DIAGNOSIS — I2582 Chronic total occlusion of coronary artery: Secondary | ICD-10-CM | POA: Diagnosis not present

## 2018-07-04 DIAGNOSIS — Z79899 Other long term (current) drug therapy: Secondary | ICD-10-CM | POA: Diagnosis not present

## 2018-07-04 DIAGNOSIS — Z7951 Long term (current) use of inhaled steroids: Secondary | ICD-10-CM | POA: Diagnosis not present

## 2018-07-04 DIAGNOSIS — I2510 Atherosclerotic heart disease of native coronary artery without angina pectoris: Secondary | ICD-10-CM | POA: Diagnosis not present

## 2018-07-04 DIAGNOSIS — I6523 Occlusion and stenosis of bilateral carotid arteries: Secondary | ICD-10-CM | POA: Diagnosis not present

## 2018-07-04 DIAGNOSIS — H1851 Endothelial corneal dystrophy: Secondary | ICD-10-CM | POA: Diagnosis not present

## 2018-07-04 DIAGNOSIS — Z85828 Personal history of other malignant neoplasm of skin: Secondary | ICD-10-CM | POA: Diagnosis not present

## 2018-07-04 DIAGNOSIS — I10 Essential (primary) hypertension: Secondary | ICD-10-CM | POA: Diagnosis not present

## 2018-07-04 DIAGNOSIS — Z86011 Personal history of benign neoplasm of the brain: Secondary | ICD-10-CM | POA: Diagnosis not present

## 2018-07-04 DIAGNOSIS — Z87891 Personal history of nicotine dependence: Secondary | ICD-10-CM | POA: Diagnosis not present

## 2018-07-04 DIAGNOSIS — K219 Gastro-esophageal reflux disease without esophagitis: Secondary | ICD-10-CM | POA: Diagnosis not present

## 2018-07-04 DIAGNOSIS — E785 Hyperlipidemia, unspecified: Secondary | ICD-10-CM | POA: Diagnosis not present

## 2018-07-05 DIAGNOSIS — Z48810 Encounter for surgical aftercare following surgery on the sense organs: Secondary | ICD-10-CM | POA: Diagnosis not present

## 2018-07-05 DIAGNOSIS — Z947 Corneal transplant status: Secondary | ICD-10-CM | POA: Diagnosis not present

## 2018-07-05 DIAGNOSIS — H1851 Endothelial corneal dystrophy: Secondary | ICD-10-CM | POA: Diagnosis not present

## 2018-07-08 DIAGNOSIS — Z947 Corneal transplant status: Secondary | ICD-10-CM | POA: Diagnosis not present

## 2018-07-08 DIAGNOSIS — H1851 Endothelial corneal dystrophy: Secondary | ICD-10-CM | POA: Diagnosis not present

## 2018-07-08 DIAGNOSIS — Z48810 Encounter for surgical aftercare following surgery on the sense organs: Secondary | ICD-10-CM | POA: Diagnosis not present

## 2018-07-12 DIAGNOSIS — I2510 Atherosclerotic heart disease of native coronary artery without angina pectoris: Secondary | ICD-10-CM | POA: Diagnosis not present

## 2018-07-12 DIAGNOSIS — I10 Essential (primary) hypertension: Secondary | ICD-10-CM | POA: Diagnosis not present

## 2018-07-12 DIAGNOSIS — E785 Hyperlipidemia, unspecified: Secondary | ICD-10-CM | POA: Diagnosis not present

## 2018-07-12 DIAGNOSIS — E041 Nontoxic single thyroid nodule: Secondary | ICD-10-CM | POA: Diagnosis not present

## 2018-07-12 DIAGNOSIS — M810 Age-related osteoporosis without current pathological fracture: Secondary | ICD-10-CM | POA: Diagnosis not present

## 2018-07-15 ENCOUNTER — Other Ambulatory Visit: Payer: Medicare Other

## 2018-07-15 DIAGNOSIS — E785 Hyperlipidemia, unspecified: Secondary | ICD-10-CM

## 2018-07-15 DIAGNOSIS — M810 Age-related osteoporosis without current pathological fracture: Secondary | ICD-10-CM

## 2018-07-15 DIAGNOSIS — I2510 Atherosclerotic heart disease of native coronary artery without angina pectoris: Secondary | ICD-10-CM

## 2018-07-15 DIAGNOSIS — I10 Essential (primary) hypertension: Secondary | ICD-10-CM

## 2018-07-15 DIAGNOSIS — E041 Nontoxic single thyroid nodule: Secondary | ICD-10-CM

## 2018-07-16 ENCOUNTER — Other Ambulatory Visit: Payer: Self-pay

## 2018-07-16 DIAGNOSIS — E785 Hyperlipidemia, unspecified: Secondary | ICD-10-CM

## 2018-07-16 LAB — COMPLETE METABOLIC PANEL WITH GFR
AG Ratio: 1.8 (calc) (ref 1.0–2.5)
ALT: 9 U/L (ref 6–29)
AST: 21 U/L (ref 10–35)
Albumin: 4.1 g/dL (ref 3.6–5.1)
Alkaline phosphatase (APISO): 36 U/L (ref 33–130)
BILIRUBIN TOTAL: 0.6 mg/dL (ref 0.2–1.2)
BUN/Creatinine Ratio: 20 (calc) (ref 6–22)
BUN: 18 mg/dL (ref 7–25)
CHLORIDE: 102 mmol/L (ref 98–110)
CO2: 28 mmol/L (ref 20–32)
Calcium: 9.4 mg/dL (ref 8.6–10.4)
Creat: 0.89 mg/dL — ABNORMAL HIGH (ref 0.60–0.88)
GFR, EST AFRICAN AMERICAN: 70 mL/min/{1.73_m2} (ref >=60)
GFR, Est Non African American: 61 mL/min/{1.73_m2} (ref >=60)
GLOBULIN: 2.3 g/dL (ref 1.9–3.7)
Glucose, Bld: 78 mg/dL (ref 65–99)
POTASSIUM: 4 mmol/L (ref 3.5–5.3)
SODIUM: 137 mmol/L (ref 135–146)
Total Protein: 6.4 g/dL (ref 6.1–8.1)

## 2018-07-16 LAB — CBC
HEMATOCRIT: 42 % (ref 35.0–45.0)
HEMOGLOBIN: 14.3 g/dL (ref 11.7–15.5)
MCH: 31.8 pg (ref 27.0–33.0)
MCHC: 34 g/dL (ref 32.0–36.0)
MCV: 93.5 fL (ref 80.0–100.0)
MPV: 9.3 fL (ref 7.5–12.5)
Platelets: 174 10*3/uL (ref 140–400)
RBC: 4.49 10*6/uL (ref 3.80–5.10)
RDW: 11.7 % (ref 11.0–15.0)
WBC: 6.9 10*3/uL (ref 3.8–10.8)

## 2018-07-16 LAB — LIPID PANEL
Cholesterol: 177 mg/dL (ref <200)
HDL: 86 mg/dL (ref >50)
LDL Cholesterol (Calc): 75 mg/dL (calc)
NON-HDL CHOLESTEROL (CALC): 91 mg/dL (ref <130)
TRIGLYCERIDES: 80 mg/dL (ref <150)
Total CHOL/HDL Ratio: 2.1 (calc) (ref <5.0)

## 2018-07-16 LAB — TSH: TSH: 1.45 mIU/L (ref 0.40–4.50)

## 2018-07-22 ENCOUNTER — Encounter: Payer: Self-pay | Admitting: Internal Medicine

## 2018-07-23 ENCOUNTER — Encounter: Payer: Self-pay | Admitting: Family

## 2018-07-24 ENCOUNTER — Other Ambulatory Visit: Payer: Self-pay

## 2018-07-24 ENCOUNTER — Encounter: Payer: Self-pay | Admitting: Family Medicine

## 2018-07-24 ENCOUNTER — Non-Acute Institutional Stay: Payer: Medicare Other | Admitting: Family Medicine

## 2018-07-24 VITALS — BP 130/80 | HR 69 | Temp 97.8°F | Ht 62.0 in | Wt 114.2 lb

## 2018-07-24 DIAGNOSIS — I6523 Occlusion and stenosis of bilateral carotid arteries: Secondary | ICD-10-CM

## 2018-07-24 DIAGNOSIS — M810 Age-related osteoporosis without current pathological fracture: Secondary | ICD-10-CM

## 2018-07-24 MED ORDER — PRAVASTATIN SODIUM 80 MG PO TABS: 40.0000 mg | ORAL_TABLET | Freq: Every day | ORAL | 1 refills | Status: DC

## 2018-07-24 MED ORDER — HYDROCHLOROTHIAZIDE 25 MG PO TABS: 25.0000 mg | ORAL_TABLET | Freq: Every day | ORAL | 3 refills | Status: DC

## 2018-07-24 MED ORDER — DENOSUMAB 60 MG/ML ~~LOC~~ SOSY
60.0000 mg | PREFILLED_SYRINGE | Freq: Once | SUBCUTANEOUS | Status: AC
  Administered 2018-07-24: 60 mg via SUBCUTANEOUS

## 2018-07-24 NOTE — Progress Notes (Signed)
Location:  [place] of Service:  Clinic [phone])  Provider: Alain Honey MD  Code Status: Full Goals of Care:  Advanced Directives 02/01/2018  Does Patient Have a Medical Advance Directive? Yes  Type of Paramedic of [place];Living will  Does patient want to make changes to medical advance directive? No - Patient declined  Copy of [place] in Chart? No - copy requested     Chief Complaint  Patient presents with  . Annual Exam    Physical    HPI: Patient is a 83 y.o. female seen today for medical management of chronic diseases.  Sclerosis artery stenosis, esophageal reflux disease, osteoporosis, hyperlipidemia.  She presents today for exam.  Current problems was told to reduce her pravastatin her last lab results.  She currently takes 80 mg.  He also takes WelChol low fossa for lipids.  There is a strong family history of disease and stroke.  She has had one stent coronary artery.  Denies any recent change in weight.  Appetite is good.  There have been no falls.  She exercises regularly.  Would note that on her medicine list is inclusion of Zantac.  This is been well controlled from market discussed changing to another first generation acid suppressor such as Pepcid.   Past Medical History:  Diagnosis Date  . Allergy    seasonal  . Anemia   . Arthritis   . Benign neoplasm of descending colon 10/22/2014  . CAD (coronary artery disease)    LAD lession   . Cancer ([place])    skin  . Cataract    removed bilaterally  . Diverticulosis 11/00  . Esophagitis, acute   . Fuch's endothelial dystrophy 04/2009   Dr, Shanon Rosser   . Gastritis 11/00  . GERD (gastroesophageal reflux disease) 11/00  . Headache   . Hemorrhoids 8/05  . Hyperlipidemia   . Hypertension   . Lung nodule 1/09  . NSVD (normal spontaneous vaginal delivery) 1967   female   . Osteopenia   . Osteoporosis   . Thyroid nodule 8/08    Past Surgical History:    Procedure Laterality Date  . bilateral cataract surgery  06/2007   DR. Groat   . bilateral cataract surgery  10/08   Dr. Katy Fitch   . cardiac cath 40-50%LAD  6/07  . cardiac cath with angioplasty & stent replacement  4/98  . COLONOSCOPY    . CRANIOTOMY Left 05/13/2015   Procedure: Left frontotemporal Craniotomy for Meningioma resection;  Surgeon: Consuella Lose, MD;  Location: [facility];  Service: Neurosurgery;  Laterality: Left;  . CT LUNG SCREENING  6/07  . FACIAL COSMETIC SURGERY  7/96   Dr. Mikle Bosworth    . MENISCUS REPAIR      Allergies  Allergen Reactions  . Sulfonamide Derivatives   . K+ Care [Potassium Chloride] Diarrhea  . Lipitor [Atorvastatin Calcium]     Myalgias    . Sulfa Antibiotics Rash    Fever blisters    Outpatient Encounter Medications as of 07/24/2018  Medication Sig  . aspirin EC 81 MG tablet Take 81 mg by mouth daily.  . Calcium-Vitamin D-Vitamin K (CALCIUM + D) [phone] MG-UNT-MCG CHEW Chew 1 tablet by mouth daily.  . Cholecalciferol (VITAMIN D3) 1000 UNITS CAPS Take 2,000 Units by mouth daily.   Marland Kitchen co-enzyme Q-10 30 MG capsule Take 1 capsule (30 mg total) by mouth daily.  . colesevelam (WELCHOL) 625 MG tablet Take  3 tablets (1,875 mg total) by mouth 2 (two) times daily with a meal.  . denosumab (PROLIA) 60 MG/ML SOLN injection Inject 60 mg into the skin every 6 (six) months. Administer in upper arm, thigh, or abdomen  . fluticasone (FLONASE) 50 MCG/ACT nasal spray USE 1 TO 2 SPRAYS IN EACH  NOSTRIL DAILY AS DIRECTED  FOR ALLERGIC RHINITIS  . hydrochlorothiazide (MICROZIDE) 12.5 MG capsule Take 1 capsule (12.5 mg total) by mouth daily.  Marland Kitchen L-Lysine 1000 MG TABS Take 1,000 mg by mouth daily.  Marland Kitchen loratadine (CLARITIN) 10 MG tablet Take 1 tablet (10 mg total) by mouth daily.  . magnesium oxide (MAG-OX) 400 MG tablet Take 400 mg by mouth daily.  . Multiple Vitamin (MULTI-VITAMIN PO) Take 1 tablet by mouth daily.   Marland Kitchen omega-3 acid ethyl esters (LOVAZA) 1 g  capsule TAKE 2 CAPSULES (=2GM)     TWICE DAILY  . polyethylene glycol powder (GLYCOLAX/MIRALAX) powder Take 17 g by mouth daily as needed for moderate constipation.  . pravastatin (PRAVACHOL) 80 MG tablet Take 1 tablet (80 mg total) by mouth daily.  . ranitidine (ZANTAC) 150 MG tablet Take 150 mg by mouth daily.  . sodium chloride (MURO 128) 5 % ophthalmic ointment Place 1 application into both eyes 4 (four) times daily.  . valACYclovir (VALTREX) 1000 MG tablet Take 1,000 mg by mouth as needed (for fever blisters).   Facility-Administered Encounter Medications as of 07/24/2018  Medication  . denosumab (PROLIA) injection 60 mg    Review of Systems:  Review of Systems  Constitutional: Negative.   HENT: Negative.   Eyes: Negative.   Respiratory: Negative.   Cardiovascular: Negative.   Gastrointestinal: Negative.   Endocrine: Negative.   Genitourinary: Negative.   Hematological: Negative.   Psychiatric/Behavioral: Negative.     Health Maintenance  Topic Date Due  . PNA vac Low Risk Adult (2 of 2 - PPSV23) 08/21/2014  . INFLUENZA VACCINE  05/02/2018  . DEXA SCAN  07/03/2018  . MAMMOGRAM  12/28/2018  . TETANUS/TDAP  04/01/2021    Physical Exam: Vitals:   07/24/18 1100  BP: 130/80  Pulse: 69  Temp: 97.8 F (36.6 C)  TempSrc: Oral  SpO2: 98%  Weight: 114 lb 3.2 oz (51.8 kg)  Height: 5\' 2"  (1.575 m)   Body mass index is 20.89 kg/m. Physical Exam  Constitutional: She is oriented to person, place, and time. She appears well-developed and well-nourished.  HENT:  Head: Normocephalic.  Right Ear: External ear normal.  Left Ear: External ear normal.  Mouth/Throat: Oropharynx is clear and moist.  Eyes: Pupils are equal, round, and reactive to light. EOM are normal.  Neck: Normal range of motion. Neck supple.  Cardiovascular: Normal rate and regular rhythm.  Murmur heard. Pulmonary/Chest: Effort normal and breath sounds normal.  Abdominal: Soft. Bowel sounds are normal.    Musculoskeletal: Normal range of motion.  Neurological: She is alert and oriented to person, place, and time.  Skin: Skin is warm.  Psychiatric: She has a normal mood and affect. Her behavior is normal. Judgment and thought content normal.  Nursing note and vitals reviewed.   Labs reviewed: Basic Metabolic Panel: Recent Labs    01/07/18 0750 07/12/18 0000  NA 141 137  K 4.0 4.0  CL 103 102  CO2 32 28  GLUCOSE 78 78  BUN 19 18  CREATININE 0.98* 0.89*  CALCIUM 9.8 9.4  TSH  --  1.45   Liver Function Tests: Recent Labs    01/07/18  0750 07/12/18 0000  AST 31 21  ALT 13 9  BILITOT 0.7 0.6  PROT 6.5 6.4   No results for input(s): LIPASE, AMYLASE in the last 8760 hours. No results for input(s): AMMONIA in the last 8760 hours. CBC: Recent Labs    01/07/18 0750 07/12/18 0000  WBC 6.4 6.9  HGB 14.4 14.3  HCT 41.9 42.0  MCV 93.3 93.5  PLT 160 174   Lipid Panel: Recent Labs    01/07/18 0750 07/12/18 0000  CHOL 194 177  HDL 78 86  LDLCALC 99 75  TRIG 77 80  CHOLHDL 2.5 2.1   No results found for: HGBA1C  Procedures since last visit: No results found.  Assessment/Plan 1. Osteoporosis, postmenopausal Use 6 months  - denosumab (PROLIA) injection 60 mg 2.  Hyperlipidemia We will reduce pravastatin to 40 mg so would reduce WelChol to 1 tablet rather than 3 3 months   Osteoporosis, postmenopausal Labs/tests ordered:  @ORDERS @ Next appt:  10/17/2018  Lillette Boxer. Sabra Heck, [place] [address], [place] Office [phone]

## 2018-07-25 DIAGNOSIS — I6523 Occlusion and stenosis of bilateral carotid arteries: Secondary | ICD-10-CM | POA: Diagnosis not present

## 2018-07-25 DIAGNOSIS — J302 Other seasonal allergic rhinitis: Secondary | ICD-10-CM | POA: Diagnosis not present

## 2018-07-25 DIAGNOSIS — Z947 Corneal transplant status: Secondary | ICD-10-CM | POA: Diagnosis not present

## 2018-07-25 DIAGNOSIS — K219 Gastro-esophageal reflux disease without esophagitis: Secondary | ICD-10-CM | POA: Diagnosis not present

## 2018-07-25 DIAGNOSIS — Z87891 Personal history of nicotine dependence: Secondary | ICD-10-CM | POA: Diagnosis not present

## 2018-07-25 DIAGNOSIS — E785 Hyperlipidemia, unspecified: Secondary | ICD-10-CM | POA: Diagnosis not present

## 2018-07-25 DIAGNOSIS — I10 Essential (primary) hypertension: Secondary | ICD-10-CM | POA: Diagnosis not present

## 2018-07-25 DIAGNOSIS — Z7982 Long term (current) use of aspirin: Secondary | ICD-10-CM | POA: Diagnosis not present

## 2018-07-25 DIAGNOSIS — T86841 Corneal transplant failure: Secondary | ICD-10-CM | POA: Diagnosis not present

## 2018-07-25 DIAGNOSIS — I2510 Atherosclerotic heart disease of native coronary artery without angina pectoris: Secondary | ICD-10-CM | POA: Diagnosis not present

## 2018-07-25 DIAGNOSIS — H1851 Endothelial corneal dystrophy: Secondary | ICD-10-CM | POA: Diagnosis not present

## 2018-07-25 DIAGNOSIS — Z85828 Personal history of other malignant neoplasm of skin: Secondary | ICD-10-CM | POA: Diagnosis not present

## 2018-07-25 DIAGNOSIS — Z9889 Other specified postprocedural states: Secondary | ICD-10-CM | POA: Diagnosis not present

## 2018-07-25 DIAGNOSIS — Z955 Presence of coronary angioplasty implant and graft: Secondary | ICD-10-CM | POA: Diagnosis not present

## 2018-07-25 DIAGNOSIS — Z79899 Other long term (current) drug therapy: Secondary | ICD-10-CM | POA: Diagnosis not present

## 2018-07-26 ENCOUNTER — Telehealth: Payer: Self-pay

## 2018-07-26 NOTE — Telephone Encounter (Signed)
Updated medications per Dr. Sabra Heck written note following patient clinic visit on Wednesday 07/24/18. Dr. Sabra Heck wanted Pravastatin cut in half daily, stop zantac, start Pepcid, reduce Welch ol to 1/2.Marland Kitchen Updated patients medication list to reflect these changes.

## 2018-08-02 DIAGNOSIS — Z947 Corneal transplant status: Secondary | ICD-10-CM | POA: Diagnosis not present

## 2018-08-02 DIAGNOSIS — Z961 Presence of intraocular lens: Secondary | ICD-10-CM | POA: Diagnosis not present

## 2018-08-02 DIAGNOSIS — H1851 Endothelial corneal dystrophy: Secondary | ICD-10-CM | POA: Diagnosis not present

## 2018-08-02 DIAGNOSIS — Z882 Allergy status to sulfonamides status: Secondary | ICD-10-CM | POA: Diagnosis not present

## 2018-08-14 ENCOUNTER — Ambulatory Visit
Admission: RE | Admit: 2018-08-14 | Discharge: 2018-08-14 | Disposition: A | Discharge status: Home / Self Care | Payer: Medicare Other | Source: Ambulatory Visit | Attending: Family Medicine | Admitting: Family Medicine

## 2018-08-14 DIAGNOSIS — M85851 Other specified disorders of bone density and structure, right thigh: Secondary | ICD-10-CM | POA: Diagnosis not present

## 2018-08-14 DIAGNOSIS — Z780 Asymptomatic menopausal state: Secondary | ICD-10-CM | POA: Diagnosis not present

## 2018-08-14 DIAGNOSIS — M810 Age-related osteoporosis without current pathological fracture: Secondary | ICD-10-CM

## 2018-08-15 ENCOUNTER — Other Ambulatory Visit: Payer: Self-pay | Admitting: *Deleted

## 2018-08-27 ENCOUNTER — Other Ambulatory Visit: Payer: Self-pay

## 2018-08-27 DIAGNOSIS — E785 Hyperlipidemia, unspecified: Secondary | ICD-10-CM

## 2018-08-27 DIAGNOSIS — I10 Essential (primary) hypertension: Secondary | ICD-10-CM

## 2018-09-11 DIAGNOSIS — E785 Hyperlipidemia, unspecified: Secondary | ICD-10-CM | POA: Diagnosis not present

## 2018-09-11 DIAGNOSIS — I10 Essential (primary) hypertension: Secondary | ICD-10-CM | POA: Diagnosis not present

## 2018-09-12 ENCOUNTER — Other Ambulatory Visit: Payer: Medicare Other

## 2018-09-12 DIAGNOSIS — E785 Hyperlipidemia, unspecified: Secondary | ICD-10-CM

## 2018-09-12 DIAGNOSIS — I10 Essential (primary) hypertension: Secondary | ICD-10-CM

## 2018-09-12 LAB — LIPID PANEL
CHOL/HDL RATIO: 2.5 (calc) (ref <5.0)
Cholesterol: 216 mg/dL — ABNORMAL HIGH (ref <200)
HDL: 87 mg/dL (ref >50)
LDL Cholesterol (Calc): 114 mg/dL (calc) — ABNORMAL HIGH
Non-HDL Cholesterol (Calc): 129 mg/dL (calc) (ref <130)
Triglycerides: 67 mg/dL (ref <150)

## 2018-09-12 LAB — COMPLETE METABOLIC PANEL WITH GFR
AG Ratio: 1.7 (calc) (ref 1.0–2.5)
ALT: 12 U/L (ref 6–29)
AST: 26 U/L (ref 10–35)
Albumin: 4.1 g/dL (ref 3.6–5.1)
Alkaline phosphatase (APISO): 33 U/L (ref 33–130)
BUN / CREAT RATIO: 18 (calc) (ref 6–22)
BUN: 19 mg/dL (ref 7–25)
CALCIUM: 9.7 mg/dL (ref 8.6–10.4)
CO2: 30 mmol/L (ref 20–32)
CREATININE: 1.05 mg/dL — AB (ref 0.60–0.88)
Chloride: 102 mmol/L (ref 98–110)
GFR, EST NON AFRICAN AMERICAN: 50 mL/min/{1.73_m2} — AB (ref >=60)
GFR, Est African American: 58 mL/min/{1.73_m2} — ABNORMAL LOW (ref >=60)
GLOBULIN: 2.4 g/dL (ref 1.9–3.7)
Glucose, Bld: 72 mg/dL (ref 65–99)
Potassium: 4.1 mmol/L (ref 3.5–5.3)
SODIUM: 139 mmol/L (ref 135–146)
TOTAL PROTEIN: 6.5 g/dL (ref 6.1–8.1)
Total Bilirubin: 0.6 mg/dL (ref 0.2–1.2)

## 2018-09-17 ENCOUNTER — Telehealth: Payer: Self-pay

## 2018-09-17 MED ORDER — PRAVASTATIN SODIUM 80 MG PO TABS: 80.0000 mg | ORAL_TABLET | Freq: Every day | ORAL | 1 refills | Status: DC

## 2018-09-17 NOTE — Telephone Encounter (Signed)
Patient had previously taken pravastatin in lieu of intolerance to atorvastatin.  Okay to continue with pravastatin

## 2018-09-17 NOTE — Telephone Encounter (Signed)
Very high drug interaction with atorvastatin (statin drugs) patient has low allergy severity to statin drugs.   Patient stopped by the office to discuss her lab results. Dr Sabra Heck went over them with her labs with her and recommended she take 80 mg of pravastatin. Will send in new rx to pharmacy patient is aware

## 2018-10-01 ENCOUNTER — Other Ambulatory Visit: Payer: Self-pay | Admitting: Nurse Practitioner

## 2018-10-01 DIAGNOSIS — E785 Hyperlipidemia, unspecified: Secondary | ICD-10-CM

## 2018-10-17 ENCOUNTER — Other Ambulatory Visit: Payer: Self-pay

## 2018-11-14 ENCOUNTER — Other Ambulatory Visit: Payer: Self-pay | Admitting: Internal Medicine

## 2018-11-14 DIAGNOSIS — Z1231 Encounter for screening mammogram for malignant neoplasm of breast: Secondary | ICD-10-CM

## 2018-11-21 ENCOUNTER — Other Ambulatory Visit: Payer: Medicare Other

## 2018-12-11 DIAGNOSIS — H1851 Endothelial corneal dystrophy: Secondary | ICD-10-CM | POA: Diagnosis not present

## 2018-12-11 DIAGNOSIS — Z947 Corneal transplant status: Secondary | ICD-10-CM | POA: Diagnosis not present

## 2018-12-31 ENCOUNTER — Ambulatory Visit: Payer: Medicare Other

## 2019-01-29 ENCOUNTER — Other Ambulatory Visit: Payer: Self-pay

## 2019-01-29 ENCOUNTER — Encounter: Payer: Self-pay | Admitting: Internal Medicine

## 2019-01-29 ENCOUNTER — Non-Acute Institutional Stay: Payer: Medicare Other | Admitting: Internal Medicine

## 2019-01-29 VITALS — BP 128/70 | HR 77 | Temp 97.9°F | Ht 62.0 in | Wt 117.6 lb

## 2019-01-29 DIAGNOSIS — E041 Nontoxic single thyroid nodule: Secondary | ICD-10-CM

## 2019-01-29 DIAGNOSIS — M810 Age-related osteoporosis without current pathological fracture: Secondary | ICD-10-CM | POA: Diagnosis not present

## 2019-01-29 DIAGNOSIS — K219 Gastro-esophageal reflux disease without esophagitis: Secondary | ICD-10-CM | POA: Diagnosis not present

## 2019-01-29 DIAGNOSIS — I6523 Occlusion and stenosis of bilateral carotid arteries: Secondary | ICD-10-CM

## 2019-01-29 DIAGNOSIS — J309 Allergic rhinitis, unspecified: Secondary | ICD-10-CM | POA: Diagnosis not present

## 2019-01-29 DIAGNOSIS — I10 Essential (primary) hypertension: Secondary | ICD-10-CM | POA: Diagnosis not present

## 2019-01-29 DIAGNOSIS — E559 Vitamin D deficiency, unspecified: Secondary | ICD-10-CM

## 2019-01-29 DIAGNOSIS — E785 Hyperlipidemia, unspecified: Secondary | ICD-10-CM

## 2019-01-29 DIAGNOSIS — I2510 Atherosclerotic heart disease of native coronary artery without angina pectoris: Secondary | ICD-10-CM | POA: Diagnosis not present

## 2019-01-29 MED ORDER — DENOSUMAB 60 MG/ML ~~LOC~~ SOSY
60.0000 mg | PREFILLED_SYRINGE | Freq: Once | SUBCUTANEOUS | Status: AC
  Administered 2019-01-29: 60 mg via SUBCUTANEOUS

## 2019-01-29 MED ORDER — COLESEVELAM HCL 625 MG PO TABS: 1875.0000 mg | ORAL_TABLET | Freq: Two times a day (BID) | ORAL | 3 refills | Status: DC

## 2019-01-29 MED ORDER — HYDROCHLOROTHIAZIDE 12.5 MG PO CAPS: 12.5000 mg | ORAL_CAPSULE | Freq: Every day | ORAL | 1 refills | Status: DC

## 2019-01-29 MED ORDER — PRAVASTATIN SODIUM 80 MG PO TABS: 80.0000 mg | ORAL_TABLET | Freq: Every day | ORAL | 1 refills | Status: DC

## 2019-01-29 MED ORDER — OMEGA-3-ACID ETHYL ESTERS 1 G PO CAPS: ORAL_CAPSULE | ORAL | 1 refills | Status: DC

## 2019-01-29 NOTE — Progress Notes (Signed)
Location:  [facility] of Service:  Clinic (12)  Provider:   Code Status:  Goals of Care:  Advanced Directives 01/29/2019  Does Patient Have a Medical Advance Directive? Yes  Type of Paramedic of [place];Living will  Does patient want to make changes to medical advance directive? No - Patient declined  Copy of [place] in Chart? Yes - validated most recent copy scanned in chart (See row information)     Chief Complaint  Patient presents with  . Medical Management of Chronic Issues    6 month follow up, prolia injection,discuss if she should continue fish oil    HPI: Patient is a 84 y.o. female seen today for medical management of chronic diseases.   Patient has a history of coronary artery disease, hypertension, hyperlipidemia, thyroid nodule, osteoporosis and allergic rhinitis, corneal dystrophy,GERD Patient came in for regular exam. She lives in an independent apartment with her husband.  Is very active.  Was participating in all activities friends home.  Is continues to walk around in the facility.  Does not need cane or a walker. Patient also due for Prolia today She denies any chest pain cough or shortness of breath.     Past Medical History:  Diagnosis Date  . Allergy    seasonal  . Anemia   . Arthritis   . Benign neoplasm of descending colon 10/22/2014  . CAD (coronary artery disease)    LAD lession   . Cancer ([place])    skin  . Cataract    removed bilaterally  . Diverticulosis 11/00  . Esophagitis, acute   . Fuch's endothelial dystrophy 04/2009   Dr, Shanon Rosser   . Gastritis 11/00  . GERD (gastroesophageal reflux disease) 11/00  . Headache   . Hemorrhoids 8/05  . Hyperlipidemia   . Hypertension   . Lung nodule 1/09  . NSVD (normal spontaneous vaginal delivery) 1967   female   . Osteopenia   . Osteoporosis   . Thyroid nodule 8/08    Past Surgical History:  Procedure Laterality Date  .  bilateral cataract surgery  06/2007   DR. Groat   . bilateral cataract surgery  10/08   Dr. Katy Fitch   . cardiac cath 40-50%LAD  6/07  . cardiac cath with angioplasty & stent replacement  4/98  . COLONOSCOPY    . CRANIOTOMY Left 05/13/2015   Procedure: Left frontotemporal Craniotomy for Meningioma resection;  Surgeon: Consuella Lose, MD;  Location: [facility];  Service: Neurosurgery;  Laterality: Left;  . CT LUNG SCREENING  6/07  . FACIAL COSMETIC SURGERY  7/96   Dr. Mikle Bosworth    . MENISCUS REPAIR      Allergies  Allergen Reactions  . Sulfonamide Derivatives   . K+ Care [Potassium Chloride] Diarrhea  . Lipitor [Atorvastatin Calcium]     Myalgias    . Sulfa Antibiotics Rash    Fever blisters    Outpatient Encounter Medications as of 01/29/2019  Medication Sig  . aspirin EC 81 MG tablet Take 81 mg by mouth daily.  . Calcium-Vitamin D-Vitamin K (CALCIUM + D) [phone] MG-UNT-MCG CHEW Chew 1 tablet by mouth daily.  . Cholecalciferol (VITAMIN D3) 1000 UNITS CAPS Take 2,000 Units by mouth daily.   Marland Kitchen co-enzyme Q-10 30 MG capsule Take 1 capsule (30 mg total) by mouth daily.  . colesevelam (WELCHOL) 625 MG tablet Take 3 tablets (1,875 mg total) by mouth 2 (two) times daily with a  meal.  . denosumab (PROLIA) 60 MG/ML SOLN injection Inject 60 mg into the skin every 6 (six) months. Administer in upper arm, thigh, or abdomen  . fluticasone (FLONASE) 50 MCG/ACT nasal spray USE 1 TO 2 SPRAYS IN EACH  NOSTRIL DAILY AS DIRECTED  FOR ALLERGIC RHINITIS  . hydrochlorothiazide (MICROZIDE) 12.5 MG capsule Take 1 capsule (12.5 mg total) by mouth daily.  Marland Kitchen loratadine (CLARITIN) 10 MG tablet Take 1 tablet (10 mg total) by mouth daily.  . magnesium oxide (MAG-OX) 400 MG tablet Take 400 mg by mouth daily.  . Multiple Vitamin (MULTI-VITAMIN PO) Take 1 tablet by mouth daily.   Marland Kitchen omega-3 acid ethyl esters (LOVAZA) 1 g capsule TAKE 2 CAPSULES (=2GM)     TWICE DAILY  . polyethylene glycol powder  (GLYCOLAX/MIRALAX) powder Take 17 g by mouth daily as needed for moderate constipation.  . pravastatin (PRAVACHOL) 80 MG tablet Take 1 tablet (80 mg total) by mouth daily.  . sodium chloride (MURO 128) 5 % ophthalmic ointment Place 1 application into both eyes 4 (four) times daily.  . valACYclovir (VALTREX) 1000 MG tablet Take 1,000 mg by mouth as needed (for fever blisters).  . [DISCONTINUED] colesevelam (WELCHOL) 625 MG tablet Take 3 tablets (1,875 mg total) by mouth 2 (two) times daily with a meal.  . [DISCONTINUED] hydrochlorothiazide (MICROZIDE) 12.5 MG capsule Take 1 capsule (12.5 mg total) by mouth daily.  . [DISCONTINUED] omega-3 acid ethyl esters (LOVAZA) 1 g capsule TAKE 2 CAPSULES (=2GM)     TWICE DAILY  . [DISCONTINUED] pravastatin (PRAVACHOL) 80 MG tablet Take 1 tablet (80 mg total) by mouth daily.  Marland Kitchen L-Lysine 1000 MG TABS Take 1,000 mg by mouth daily.  . [DISCONTINUED] hydrochlorothiazide (HYDRODIURIL) 25 MG tablet Take 1 tablet (25 mg total) by mouth daily. (Patient taking differently: Take 12.5 mg by mouth daily. )   No facility-administered encounter medications on file as of 01/29/2019.     Review of Systems:  Review of Systems  Review of Systems  Constitutional: Negative for activity change, appetite change, chills, diaphoresis, fatigue and fever.  HENT: Negative for mouth sores, postnasal drip, rhinorrhea, sinus pain and sore throat.   Respiratory: Negative for apnea, cough, chest tightness, shortness of breath and wheezing.   Cardiovascular: Negative for chest pain, palpitations and leg swelling.  Gastrointestinal: Negative for abdominal distention, abdominal pain, constipation, diarrhea, nausea and vomiting.  Genitourinary: Negative for dysuria and frequency.  Musculoskeletal: Negative for arthralgias, joint swelling and myalgias.  Skin: Negative for rash.  Neurological: Negative for dizziness, syncope, weakness, light-headedness and numbness.  Psychiatric/Behavioral:  Negative for behavioral problems, confusion and sleep disturbance.     Health Maintenance  Topic Date Due  . PNA vac Low Risk Adult (2 of 2 - PPSV23) 08/21/2014  . MAMMOGRAM  12/28/2018  . INFLUENZA VACCINE  05/03/2019  . DEXA SCAN  08/14/2020  . TETANUS/TDAP  04/01/2021    Physical Exam: Vitals:   01/29/19 1324  BP: 128/70  Pulse: 77  Temp: 97.9 F (36.6 C)  TempSrc: Oral  SpO2: 95%  Weight: 117 lb 9.6 oz (53.3 kg)  Height: 5\' 2"  (1.575 m)   Body mass index is 21.51 kg/m. Physical Exam  Constitutional: Oriented to person, place, and time. Well-developed and well-nourished.  HENT:  Head: Normocephalic.  Mouth/Throat: Oropharynx is clear and moist.  Eyes: Pupils are equal, round, and reactive to light.  Neck: Neck supple.  Cardiovascular: Normal rate and normal heart sounds.  No murmur heard. Pulmonary/Chest: Effort normal  and breath sounds normal. No respiratory distress. No wheezes. She has no rales.  Abdominal: Soft. Bowel sounds are normal. No distension. There is no tenderness. There is no rebound.  Musculoskeletal: No edema.  Lymphadenopathy: none Neurological: Alert and oriented to person, place, and time.  Skin: Skin is warm and dry.  Psychiatric: Normal mood and affect. Behavior is normal. Thought content normal.    Labs reviewed: Basic Metabolic Panel: Recent Labs    07/12/18 0000 09/11/18 0000  NA 137 139  K 4.0 4.1  CL 102 102  CO2 28 30  GLUCOSE 78 72  BUN 18 19  CREATININE 0.89* 1.05*  CALCIUM 9.4 9.7  TSH 1.45  --    Liver Function Tests: Recent Labs    07/12/18 0000 09/11/18 0000  AST 21 26  ALT 9 12  BILITOT 0.6 0.6  PROT 6.4 6.5   No results for input(s): LIPASE, AMYLASE in the last 8760 hours. No results for input(s): AMMONIA in the last 8760 hours. CBC: Recent Labs    07/12/18 0000  WBC 6.9  HGB 14.3  HCT 42.0  MCV 93.5  PLT 174   Lipid Panel: Recent Labs    07/12/18 0000 09/11/18 0000  CHOL 177 216*  HDL 86  87  LDLCALC 75 114*  TRIG 80 67  CHOLHDL 2.1 2.5   No results found for: HGBA1C  Procedures since last visit: No results found.  Assessment/Plan Osteoporosis, postmenopausal -  Last DEXA scan done in 11/19 showed T score of -3.3 Patient  said that she took it for 2 years since 2015 but then there was a time when she was off it and was recently restarted. Continue Prolia for now.  Repeat DEXA scan in 2 years Further duration of Prolia would be decided after that   Plan: denosumab (PROLIA) injection 60 mg  Essential hypertension BP controlled on HCTZ Repeat BMP   CAD Stable On Statin, and Aspirin Follows with Cardiology  Allergic rhinitis, Continue Flonase and Claritin  Bilateral carotid artery stenosis Follows with Cardiologist for this  THYROID NODULE TSH has been normal Will repeat  Vitamin D deficiency Continue on Vit D and Calcium Hyperlipidemia,  On Pravachol , Lovaza and Welchol Repeat Lipid Panel     Labs/tests ordered:  @ORDERS @ Next appt:  Visit date not found Follow up in 6 months

## 2019-02-03 ENCOUNTER — Other Ambulatory Visit: Payer: Medicare Other

## 2019-02-03 ENCOUNTER — Other Ambulatory Visit: Payer: Self-pay

## 2019-02-03 DIAGNOSIS — I2510 Atherosclerotic heart disease of native coronary artery without angina pectoris: Secondary | ICD-10-CM

## 2019-02-03 DIAGNOSIS — E559 Vitamin D deficiency, unspecified: Secondary | ICD-10-CM | POA: Diagnosis not present

## 2019-02-03 DIAGNOSIS — E041 Nontoxic single thyroid nodule: Secondary | ICD-10-CM | POA: Diagnosis not present

## 2019-02-03 DIAGNOSIS — I10 Essential (primary) hypertension: Secondary | ICD-10-CM | POA: Diagnosis not present

## 2019-02-03 LAB — CBC WITH DIFFERENTIAL/PLATELET
Absolute Monocytes: 704 cells/uL (ref 200–950)
Basophils Absolute: 48 cells/uL (ref 0–200)
Basophils Relative: 0.7 %
Eosinophils Absolute: 380 cells/uL (ref 15–500)
Eosinophils Relative: 5.5 %
HCT: 42.6 % (ref 35.0–45.0)
Hemoglobin: 14.6 g/dL (ref 11.7–15.5)
Lymphs Abs: 3098 cells/uL (ref 850–3900)
MCH: 32.2 pg (ref 27.0–33.0)
MCHC: 34.3 g/dL (ref 32.0–36.0)
MCV: 94 fL (ref 80.0–100.0)
MPV: 9.4 fL (ref 7.5–12.5)
Monocytes Relative: 10.2 %
Neutro Abs: 2670 cells/uL (ref 1500–7800)
Neutrophils Relative %: 38.7 %
Platelets: 182 10*3/uL (ref 140–400)
RBC: 4.53 10*6/uL (ref 3.80–5.10)
RDW: 11.8 % (ref 11.0–15.0)
Total Lymphocyte: 44.9 %
WBC: 6.9 10*3/uL (ref 3.8–10.8)

## 2019-02-03 LAB — COMPLETE METABOLIC PANEL WITH GFR
AG Ratio: 1.7 (calc) (ref 1.0–2.5)
ALT: 12 U/L (ref 6–29)
AST: 23 U/L (ref 10–35)
Albumin: 4.3 g/dL (ref 3.6–5.1)
Alkaline phosphatase (APISO): 36 U/L — ABNORMAL LOW (ref 37–153)
BUN: 21 mg/dL (ref 7–25)
CO2: 30 mmol/L (ref 20–32)
Calcium: 9.7 mg/dL (ref 8.6–10.4)
Chloride: 104 mmol/L (ref 98–110)
Creat: 0.88 mg/dL (ref 0.60–0.88)
GFR, Est African American: 71 mL/min/{1.73_m2} (ref >=60)
GFR, Est Non African American: 61 mL/min/{1.73_m2} (ref >=60)
Globulin: 2.6 g/dL (calc) (ref 1.9–3.7)
Glucose, Bld: 79 mg/dL (ref 65–99)
Potassium: 4.2 mmol/L (ref 3.5–5.3)
Sodium: 141 mmol/L (ref 135–146)
Total Bilirubin: 0.6 mg/dL (ref 0.2–1.2)
Total Protein: 6.9 g/dL (ref 6.1–8.1)

## 2019-02-03 LAB — LIPID PANEL
Cholesterol: 215 mg/dL — ABNORMAL HIGH (ref <200)
HDL: 79 mg/dL (ref >=50)
LDL Cholesterol (Calc): 117 mg/dL (calc) — ABNORMAL HIGH
Non-HDL Cholesterol (Calc): 136 mg/dL (calc) — ABNORMAL HIGH (ref <130)
Total CHOL/HDL Ratio: 2.7 (calc) (ref <5.0)
Triglycerides: 87 mg/dL (ref <150)

## 2019-02-03 LAB — TSH: TSH: 1.71 mIU/L (ref 0.40–4.50)

## 2019-02-03 LAB — VITAMIN D 25 HYDROXY (VIT D DEFICIENCY, FRACTURES): Vit D, 25-Hydroxy: 60 ng/mL (ref 30–100)

## 2019-02-07 ENCOUNTER — Telehealth: Payer: Self-pay | Admitting: *Deleted

## 2019-02-07 NOTE — Telephone Encounter (Signed)
Yolanda [name] came to the clinic on 02/05/2019 and stated her pravastatin has not been sent to her.I was in the middle of rooming a patient and I explained to her I would look into it and see what the pharmacy needed.  The medication was filled on 4/29 and the pharmacy placed the medication on hold with a mail order #[id number] order date of  02/10/2019. I called Yolanda [name] and she is aware of this she verbalized understanding. This  information was also on her recent order sheet she received from the pharmacy that she showed me.

## 2019-02-07 NOTE — Telephone Encounter (Signed)
Patient called and stated that she spoke with Tiffany last week regarding her Pravastatin Rx. Stated that [facility] needed to be called and she spoke with her about doing this but she still hasn't received her medication. Patient requesting to speak with Tiffany regarding this. Please call [phone]

## 2019-02-10 DIAGNOSIS — Z1159 Encounter for screening for other viral diseases: Secondary | ICD-10-CM | POA: Diagnosis not present

## 2019-02-10 DIAGNOSIS — Z9842 Cataract extraction status, left eye: Secondary | ICD-10-CM | POA: Diagnosis not present

## 2019-02-10 DIAGNOSIS — H1851 Endothelial corneal dystrophy: Secondary | ICD-10-CM | POA: Diagnosis not present

## 2019-02-13 DIAGNOSIS — Z947 Corneal transplant status: Secondary | ICD-10-CM | POA: Diagnosis not present

## 2019-02-13 DIAGNOSIS — H1851 Endothelial corneal dystrophy: Secondary | ICD-10-CM | POA: Diagnosis not present

## 2019-02-14 DIAGNOSIS — Z48810 Encounter for surgical aftercare following surgery on the sense organs: Secondary | ICD-10-CM | POA: Diagnosis not present

## 2019-02-14 DIAGNOSIS — Z947 Corneal transplant status: Secondary | ICD-10-CM | POA: Diagnosis not present

## 2019-02-17 DIAGNOSIS — Z7952 Long term (current) use of systemic steroids: Secondary | ICD-10-CM | POA: Diagnosis not present

## 2019-02-17 DIAGNOSIS — Z9889 Other specified postprocedural states: Secondary | ICD-10-CM | POA: Diagnosis not present

## 2019-02-17 DIAGNOSIS — Z947 Corneal transplant status: Secondary | ICD-10-CM | POA: Diagnosis not present

## 2019-02-19 ENCOUNTER — Ambulatory Visit: Payer: Medicare Other

## 2019-02-26 DIAGNOSIS — Z79899 Other long term (current) drug therapy: Secondary | ICD-10-CM | POA: Diagnosis not present

## 2019-02-26 DIAGNOSIS — Z947 Corneal transplant status: Secondary | ICD-10-CM | POA: Diagnosis not present

## 2019-02-26 DIAGNOSIS — Z7952 Long term (current) use of systemic steroids: Secondary | ICD-10-CM | POA: Diagnosis not present

## 2019-02-26 DIAGNOSIS — Z9889 Other specified postprocedural states: Secondary | ICD-10-CM | POA: Diagnosis not present

## 2019-02-28 DIAGNOSIS — Z79899 Other long term (current) drug therapy: Secondary | ICD-10-CM | POA: Diagnosis not present

## 2019-02-28 DIAGNOSIS — Z947 Corneal transplant status: Secondary | ICD-10-CM | POA: Diagnosis not present

## 2019-02-28 DIAGNOSIS — Z9889 Other specified postprocedural states: Secondary | ICD-10-CM | POA: Diagnosis not present

## 2019-02-28 DIAGNOSIS — Z7952 Long term (current) use of systemic steroids: Secondary | ICD-10-CM | POA: Diagnosis not present

## 2019-03-11 DIAGNOSIS — L299 Pruritus, unspecified: Secondary | ICD-10-CM | POA: Diagnosis not present

## 2019-03-11 DIAGNOSIS — L570 Actinic keratosis: Secondary | ICD-10-CM | POA: Diagnosis not present

## 2019-03-11 DIAGNOSIS — L853 Xerosis cutis: Secondary | ICD-10-CM | POA: Diagnosis not present

## 2019-03-11 DIAGNOSIS — D1801 Hemangioma of skin and subcutaneous tissue: Secondary | ICD-10-CM | POA: Diagnosis not present

## 2019-03-12 ENCOUNTER — Other Ambulatory Visit: Payer: Self-pay

## 2019-03-12 ENCOUNTER — Other Ambulatory Visit (HOSPITAL_COMMUNITY): Payer: Self-pay | Admitting: Cardiology

## 2019-03-12 ENCOUNTER — Ambulatory Visit (HOSPITAL_COMMUNITY)
Admission: RE | Admit: 2019-03-12 | Discharge: 2019-03-12 | Disposition: A | Discharge status: Home / Self Care | Payer: Medicare Other | Source: Ambulatory Visit | Attending: Cardiology | Admitting: Cardiology

## 2019-03-12 DIAGNOSIS — I6523 Occlusion and stenosis of bilateral carotid arteries: Secondary | ICD-10-CM

## 2019-03-17 ENCOUNTER — Telehealth: Payer: Self-pay | Admitting: Cardiology

## 2019-03-17 NOTE — Telephone Encounter (Signed)
  Patient is returning call for carotid results

## 2019-03-17 NOTE — Telephone Encounter (Signed)
See previous note

## 2019-03-17 NOTE — Telephone Encounter (Signed)
Pt advised her results.

## 2019-04-01 ENCOUNTER — Ambulatory Visit
Admission: RE | Admit: 2019-04-01 | Discharge: 2019-04-01 | Disposition: A | Discharge status: Home / Self Care | Payer: Medicare Other | Source: Ambulatory Visit | Attending: Internal Medicine | Admitting: Internal Medicine

## 2019-04-01 ENCOUNTER — Other Ambulatory Visit: Payer: Self-pay

## 2019-04-01 DIAGNOSIS — Z1231 Encounter for screening mammogram for malignant neoplasm of breast: Secondary | ICD-10-CM

## 2019-05-14 ENCOUNTER — Other Ambulatory Visit: Payer: Self-pay

## 2019-05-14 ENCOUNTER — Encounter: Payer: Self-pay | Admitting: Internal Medicine

## 2019-05-14 ENCOUNTER — Non-Acute Institutional Stay: Payer: Medicare Other | Admitting: Internal Medicine

## 2019-05-14 VITALS — BP 118/70 | HR 74 | Temp 97.0°F | Resp 18 | Ht 62.0 in | Wt 118.0 lb

## 2019-05-14 DIAGNOSIS — R399 Unspecified symptoms and signs involving the genitourinary system: Secondary | ICD-10-CM

## 2019-05-14 DIAGNOSIS — E785 Hyperlipidemia, unspecified: Secondary | ICD-10-CM

## 2019-05-14 NOTE — Progress Notes (Addendum)
Location: [place] of Service:  Clinic (12)  Provider:   Code Status:  Goals of Care:  Advanced Directives 01/29/2019  Does Patient Have a Medical Advance Directive? Yes  Type of Paramedic of [place];Living will  Does patient want to make changes to medical advance directive? No - Patient declined  Copy of [employer] in Chart? Yes - validated most recent copy scanned in chart (See row information)     Chief Complaint  Patient presents with  . Acute Visit    C/o - stinging sensation  often, lower abd pain, x 1 week sunday    HPI: Patient is a 84 y.o. female seen today for an acute visit for Symptoms suggestive of UTI Patient has a history of coronary artery disease, hypertension, hyperlipidemia, thyroid nodule, osteoporosis and allergic rhinitis, corneal dystrophy,GERD  Patient came in with symptoms concerning for UTI. She is C/O increased frequency ,Urgency   And Pressure in Lower abdomen No Nausea or vomiting. No Fever or Chills   Past Medical History:  Diagnosis Date  . Allergy    seasonal  . Anemia   . Arthritis   . Benign neoplasm of descending colon 10/22/2014  . CAD (coronary artery disease)    LAD lession   . Cancer ([place])    skin  . Cataract    removed bilaterally  . Diverticulosis 11/00  . Esophagitis, acute   . Fuch's endothelial dystrophy 04/2009   Dr, Shanon Rosser   . Gastritis 11/00  . GERD (gastroesophageal reflux disease) 11/00  . Headache   . Hemorrhoids 8/05  . Hyperlipidemia   . Hypertension   . Lung nodule 1/09  . NSVD (normal spontaneous vaginal delivery) 1967   female   . Osteopenia   . Osteoporosis   . Thyroid nodule 8/08    Past Surgical History:  Procedure Laterality Date  . bilateral cataract surgery  06/2007   DR. Groat   . bilateral cataract surgery  10/08   Dr. Katy Fitch   . cardiac cath 40-50%LAD  6/07  . cardiac cath with angioplasty & stent replacement  4/98  .  COLONOSCOPY    . CRANIOTOMY Left 05/13/2015   Procedure: Left frontotemporal Craniotomy for Meningioma resection;  Surgeon: Consuella Lose, MD;  Location: [place] [facility];  Service: Neurosurgery;  Laterality: Left;  . CT LUNG SCREENING  6/07  . FACIAL COSMETIC SURGERY  7/96   Dr. Mikle Bosworth    . MENISCUS REPAIR      Allergies  Allergen Reactions  . Sulfonamide Derivatives   . K+ Care [Potassium Chloride] Diarrhea  . Lipitor [Atorvastatin Calcium]     Myalgias    . Sulfa Antibiotics Rash    Fever blisters    Outpatient Encounter Medications as of 05/14/2019  Medication Sig  . ammonium lactate (AMLACTIN) 12 % cream Apply topically at bedtime as needed.  Marland Kitchen aspirin EC 81 MG tablet Take 81 mg by mouth daily.  . Cholecalciferol (VITAMIN D3) 1000 UNITS CAPS Take 2,000 Units by mouth daily.   Marland Kitchen co-enzyme Q-10 30 MG capsule Take 1 capsule (30 mg total) by mouth daily.  . colesevelam (WELCHOL) 625 MG tablet Take 3 tablets (1,875 mg total) by mouth 2 (two) times daily with a meal.  . denosumab (PROLIA) 60 MG/ML SOLN injection Inject 60 mg into the skin every 6 (six) months. Administer in upper arm, thigh, or abdomen  . fluticasone (FLONASE) 50 MCG/ACT nasal spray USE 1 TO  2 SPRAYS IN EACH  NOSTRIL DAILY AS DIRECTED  FOR ALLERGIC RHINITIS  . hydrochlorothiazide (MICROZIDE) 12.5 MG capsule Take 1 capsule (12.5 mg total) by mouth daily.  Marland Kitchen L-Lysine 1000 MG TABS Take 1,000 mg by mouth daily.  Marland Kitchen loratadine (CLARITIN) 10 MG tablet Take 1 tablet (10 mg total) by mouth daily. (Patient taking differently: Take 10 mg by mouth daily as needed. )  . magnesium oxide (MAG-OX) 400 MG tablet Take 400 mg by mouth daily.  . Multiple Vitamin (MULTI-VITAMIN PO) Take 1 tablet by mouth daily.   Marland Kitchen omega-3 acid ethyl esters (LOVAZA) 1 g capsule TAKE 2 CAPSULES (=2GM)     TWICE DAILY  . polyethylene glycol powder (GLYCOLAX/MIRALAX) powder Take 17 g by mouth daily as needed for moderate constipation.  . pravastatin  (PRAVACHOL) 80 MG tablet Take 1 tablet (80 mg total) by mouth daily.  Marland Kitchen triamcinolone cream (KENALOG) 0.1 % Apply topically daily as needed.  . valACYclovir (VALTREX) 1000 MG tablet Take 1,000 mg by mouth as needed (for fever blisters).  . [DISCONTINUED] Calcium-Vitamin D-Vitamin K (CALCIUM + D) 5418438411-40 MG-UNT-MCG CHEW Chew 1 tablet by mouth daily.  . [DISCONTINUED] sodium chloride (MURO 128) 5 % ophthalmic ointment Place 1 application into both eyes 4 (four) times daily.   No facility-administered encounter medications on file as of 05/14/2019.     Review of Systems:  Review of Systems  Constitutional: Negative.   HENT: Negative.   Respiratory: Negative.   Cardiovascular: Negative.   Gastrointestinal: Negative.   Genitourinary: Positive for dysuria, flank pain, frequency and urgency. Negative for hematuria.  Skin: Negative.   Neurological: Negative.   Psychiatric/Behavioral: Negative.   All other systems reviewed and are negative.   Health Maintenance  Topic Date Due  . PNA vac Low Risk Adult (2 of 2 - PPSV23) 08/21/2014  . INFLUENZA VACCINE  05/03/2019  . MAMMOGRAM  03/31/2020  . DEXA SCAN  08/14/2020  . TETANUS/TDAP  04/01/2021    Physical Exam: Vitals:   05/14/19 1451  BP: 118/70  Pulse: 74  Resp: 18  Temp: (!) 97 F (36.1 C)  SpO2: 95%  Weight: 118 lb (53.5 kg)  Height: 5\' 2"  (1.575 m)   Body mass index is 21.58 kg/m. Physical Exam Vitals signs reviewed.  Constitutional:      Appearance: Normal appearance.  HENT:     Head: Normocephalic.     Nose: Nose normal.     Mouth/Throat:     Mouth: Mucous membranes are moist.     Pharynx: Oropharynx is clear.  Eyes:     Pupils: Pupils are equal, round, and reactive to light.  Neck:     Musculoskeletal: Neck supple.  Cardiovascular:     Rate and Rhythm: Normal rate and regular rhythm.     Pulses: Normal pulses.     Heart sounds: Normal heart sounds.  Pulmonary:     Effort: Pulmonary effort is normal.      Breath sounds: Normal breath sounds.  Abdominal:     General: Abdomen is flat. Bowel sounds are normal.     Palpations: Abdomen is soft.     Comments: Lower abdomen Discomfort  Musculoskeletal:        General: No swelling.  Neurological:     General: No focal deficit present.     Mental Status: She is alert and oriented to person, place, and time.  Psychiatric:        Mood and Affect: Mood normal.  Labs reviewed: Basic Metabolic Panel: Recent Labs    07/12/18 0000 09/11/18 0000 02/03/19 0800  NA 137 139 141  K 4.0 4.1 4.2  CL 102 102 104  CO2 28 30 30   GLUCOSE 78 72 79  BUN 18 19 21   CREATININE 0.89* 1.05* 0.88  CALCIUM 9.4 9.7 9.7  TSH 1.45  --  1.71   Liver Function Tests: Recent Labs    07/12/18 0000 09/11/18 0000 02/03/19 0800  AST 21 26 23   ALT 9 12 12   BILITOT 0.6 0.6 0.6  PROT 6.4 6.5 6.9   No results for input(s): LIPASE, AMYLASE in the last 8760 hours. No results for input(s): AMMONIA in the last 8760 hours. CBC: Recent Labs    07/12/18 0000 02/03/19 0800  WBC 6.9 6.9  NEUTROABS  --  2,670  HGB 14.3 14.6  HCT 42.0 42.6  MCV 93.5 94.0  PLT 174 182   Lipid Panel: Recent Labs    07/12/18 0000 09/11/18 0000 02/03/19 0800  CHOL 177 216* 215*  HDL 86 87 79  LDLCALC 75 114* 117*  TRIG 80 67 87  CHOLHDL 2.1 2.5 2.7   No results found for: HGBA1C  Procedures since last visit: No results found.  Assessment/Plan 1. Urinary tract infection symptoms Check UA and Culture tomorrow  2. Hyperlipidemia, unspecified hyperlipidemia type D/W Patient about high LDL She does not want to try Lipitor as she had Muscle Pain Or Crestor for now She will d/w her Cardiologist   Labs/tests ordered:  UA and Culture Next appt:  07/24/2019

## 2019-05-15 ENCOUNTER — Other Ambulatory Visit: Payer: Self-pay

## 2019-05-15 DIAGNOSIS — R399 Unspecified symptoms and signs involving the genitourinary system: Secondary | ICD-10-CM | POA: Diagnosis not present

## 2019-05-16 ENCOUNTER — Other Ambulatory Visit: Payer: Self-pay | Admitting: Internal Medicine

## 2019-05-16 ENCOUNTER — Encounter: Payer: Self-pay | Admitting: Internal Medicine

## 2019-05-16 LAB — URINALYSIS
Bilirubin Urine: NEGATIVE
Glucose, UA: NEGATIVE
Ketones, ur: NEGATIVE
Nitrite: NEGATIVE
Specific Gravity, Urine: 1.01 (ref 1.001–1.03)
pH: 8 (ref 5.0–8.0)

## 2019-05-16 LAB — URINE CULTURE
MICRO NUMBER:: 768343
SPECIMEN QUALITY:: ADEQUATE

## 2019-05-16 LAB — EXTRA URINE SPECIMEN

## 2019-05-16 MED ORDER — CIPROFLOXACIN HCL 250 MG PO TABS: 250.0000 mg | ORAL_TABLET | Freq: Two times a day (BID) | ORAL | 0 refills | Status: AC

## 2019-05-19 ENCOUNTER — Telehealth: Payer: Self-pay | Admitting: *Deleted

## 2019-05-19 NOTE — Telephone Encounter (Signed)
Called patient informed her that her medication was at the [facility]. [facility] stated [facility], so she agreed that we need to remove Kristopher Oppenheim from her pharmacy list because it is causing confusion. I told her that I will remove it.

## 2019-06-04 ENCOUNTER — Encounter: Payer: Self-pay | Admitting: Cardiology

## 2019-06-04 NOTE — Progress Notes (Signed)
HPI The patient presents for follow up of CAD.  Since I last saw her she has done well.  They are very restrictive at [facility] so she is not getting as much activity although it is starting to loosen up a little bit. The patient denies any new symptoms such as chest discomfort, neck or arm discomfort. There has been no new shortness of breath, PND or orthopnea. There have been no reported palpitations, presyncope or syncope.   Allergies  Allergen Reactions  . Sulfonamide Derivatives   . K+ Care [Potassium Chloride] Diarrhea  . Lipitor [Atorvastatin Calcium]     Myalgias    . Sulfa Antibiotics Rash    Fever blisters    Current Outpatient Medications  Medication Sig Dispense Refill  . ammonium lactate (AMLACTIN) 12 % cream Apply topically at bedtime as needed.    Marland Kitchen aspirin EC 81 MG tablet Take 81 mg by mouth daily.    . Cholecalciferol (VITAMIN D3) 1000 UNITS CAPS Take 2,000 Units by mouth daily.     Marland Kitchen co-enzyme Q-10 30 MG capsule Take 1 capsule (30 mg total) by mouth daily. 90 capsule 1  . colesevelam (WELCHOL) 625 MG tablet Take 3 tablets (1,875 mg total) by mouth 2 (two) times daily with a meal. 540 tablet 3  . denosumab (PROLIA) 60 MG/ML SOLN injection Inject 60 mg into the skin every 6 (six) months. Administer in upper arm, thigh, or abdomen    . fluticasone (FLONASE) 50 MCG/ACT nasal spray USE 1 TO 2 SPRAYS IN EACH  NOSTRIL DAILY AS DIRECTED  FOR ALLERGIC RHINITIS 48 g 1  . hydrochlorothiazide (MICROZIDE) 12.5 MG capsule Take 1 capsule (12.5 mg total) by mouth daily. 90 capsule 1  . L-Lysine 1000 MG TABS Take 1,000 mg by mouth daily.    Marland Kitchen loratadine (CLARITIN) 10 MG tablet Take 1 tablet (10 mg total) by mouth daily. (Patient taking differently: Take 10 mg by mouth daily as needed. ) 30 tablet 11  . magnesium oxide (MAG-OX) 400 MG tablet Take 400 mg by mouth daily.    . Multiple Vitamin (MULTI-VITAMIN PO) Take 1 tablet by mouth daily.     Marland Kitchen omega-3 acid ethyl esters  (LOVAZA) 1 g capsule TAKE 2 CAPSULES (=2GM)     TWICE DAILY 360 capsule 1  . polyethylene glycol powder (GLYCOLAX/MIRALAX) powder Take 17 g by mouth daily as needed for moderate constipation. 3350 g 1  . pravastatin (PRAVACHOL) 80 MG tablet Take 1 tablet (80 mg total) by mouth daily. 90 tablet 1  . triamcinolone cream (KENALOG) 0.1 % Apply topically daily as needed.    . valACYclovir (VALTREX) 1000 MG tablet Take 1,000 mg by mouth as needed (for fever blisters).     No current facility-administered medications for this visit.     Past Medical History:  Diagnosis Date  . Allergy    seasonal  . Anemia   . Arthritis   . Benign neoplasm of descending colon 10/22/2014  . CAD (coronary artery disease)    LAD lession   . Cancer ([place])    skin  . Cataract    removed bilaterally  . Diverticulosis 11/00  . Esophagitis, acute   . Fuch's endothelial dystrophy 04/2009   Dr, Shanon Rosser   . Gastritis 11/00  . GERD (gastroesophageal reflux disease) 11/00  . Hemorrhoids 8/05  . Hyperlipidemia   . Hypertension   . Lung nodule 1/09  . NSVD (normal spontaneous vaginal delivery) 1967   female   .  Osteopenia   . Osteoporosis   . Thyroid nodule 8/08    Past Surgical History:  Procedure Laterality Date  . bilateral cataract surgery  06/2007   DR. Groat   . bilateral cataract surgery  10/08   Dr. Katy Fitch   . cardiac cath 40-50%LAD  6/07  . cardiac cath with angioplasty & stent replacement  4/98  . COLONOSCOPY    . CRANIOTOMY Left 05/13/2015   Procedure: Left frontotemporal Craniotomy for Meningioma resection;  Surgeon: Consuella Lose, MD;  Location: [place] [facility];  Service: Neurosurgery;  Laterality: Left;  . CT LUNG SCREENING  6/07  . FACIAL COSMETIC SURGERY  7/96   Dr. Mikle Bosworth    . MENISCUS REPAIR      ROS:     As stated in the HPI and negative for all other systems.  PHYSICAL EXAM BP (!) 144/84   Pulse 78   Ht 5\' 1"  (1.549 m)   Wt 117 lb (53.1 kg)   SpO2 100%   BMI 22.11 kg/m    GENERAL:  Well appearing NECK:  No jugular venous distention, waveform within normal limits, carotid upstroke brisk and symmetric, no bruits, no thyromegaly LUNGS:  Clear to auscultation bilaterally CHEST:  Unremarkable HEART:  PMI not displaced or sustained,S1 and S2 within normal limits, no S3, no S4, no clicks, no rubs, no murmurs ABD:  Flat, positive bowel sounds normal in frequency in pitch, no bruits, no rebound, no guarding, no midline pulsatile mass, no hepatomegaly, no splenomegaly EXT:  2 plus pulses throughout, no edema, no cyanosis no clubbing   EKG:  Sinus rhythm, rate 78, axis within normal limits, intervals within normal limits, non specific inferior ST T wave changes, no change from previous. 06/06/2019  Lab Results  Component Value Date   CHOL 215 (H) 02/03/2019   TRIG 87 02/03/2019   HDL 79 02/03/2019   LDLCALC 117 (H) 02/03/2019   ASSESSMENT AND PLAN  CAD, UNSPECIFIED SITE -  The patient has no new sypmtoms.  No further cardiovascular testing is indicated.  We will continue with aggressive risk reduction and meds as listed.   CAROTID STENOSIS -  She had 0-39% bilateral stenosis.  I will check this again in two years.   HYPERTENSION, UNSPECIFIED -  The blood pressure is at target.  No change in therapy.   HYPERLIPIDEMIA-MIXED -  There is been a little bit of back-and-forth about her lipids.  There is been a desire to change her pravastatin.  She had a dose of WelChol reduced.  There is been a question about the fish oil.  She really does not tolerate other medications well.  Her ratio with her HDL to LDL is good.  I would leave her on the medicines as listed.

## 2019-06-06 ENCOUNTER — Other Ambulatory Visit: Payer: Self-pay

## 2019-06-06 ENCOUNTER — Encounter: Payer: Self-pay | Admitting: Cardiology

## 2019-06-06 ENCOUNTER — Ambulatory Visit (INDEPENDENT_AMBULATORY_CARE_PROVIDER_SITE_OTHER): Payer: Medicare Other | Admitting: Cardiology

## 2019-06-06 VITALS — BP 144/84 | HR 78 | Ht 61.0 in | Wt 117.0 lb

## 2019-06-06 DIAGNOSIS — E785 Hyperlipidemia, unspecified: Secondary | ICD-10-CM

## 2019-06-06 DIAGNOSIS — I6523 Occlusion and stenosis of bilateral carotid arteries: Secondary | ICD-10-CM

## 2019-06-06 DIAGNOSIS — I10 Essential (primary) hypertension: Secondary | ICD-10-CM

## 2019-06-06 DIAGNOSIS — I2510 Atherosclerotic heart disease of native coronary artery without angina pectoris: Secondary | ICD-10-CM

## 2019-06-06 NOTE — Patient Instructions (Signed)

## 2019-06-25 ENCOUNTER — Ambulatory Visit: Payer: Medicare Other | Admitting: Cardiology

## 2019-07-16 DIAGNOSIS — Z961 Presence of intraocular lens: Secondary | ICD-10-CM | POA: Diagnosis not present

## 2019-07-16 DIAGNOSIS — Z947 Corneal transplant status: Secondary | ICD-10-CM | POA: Diagnosis not present

## 2019-07-24 ENCOUNTER — Other Ambulatory Visit: Payer: Medicare Other

## 2019-07-24 ENCOUNTER — Other Ambulatory Visit: Payer: Self-pay

## 2019-07-24 DIAGNOSIS — I10 Essential (primary) hypertension: Secondary | ICD-10-CM | POA: Diagnosis not present

## 2019-07-24 DIAGNOSIS — I2510 Atherosclerotic heart disease of native coronary artery without angina pectoris: Secondary | ICD-10-CM

## 2019-07-25 LAB — COMPLETE METABOLIC PANEL WITH GFR
AG Ratio: 1.7 (calc) (ref 1.0–2.5)
ALT: 12 U/L (ref 6–29)
AST: 30 U/L (ref 10–35)
Albumin: 4.3 g/dL (ref 3.6–5.1)
Alkaline phosphatase (APISO): 37 U/L (ref 37–153)
BUN: 21 mg/dL (ref 7–25)
CO2: 29 mmol/L (ref 20–32)
Calcium: 10 mg/dL (ref 8.6–10.4)
Chloride: 101 mmol/L (ref 98–110)
Creat: 0.86 mg/dL (ref 0.60–0.88)
GFR, Est African American: 73 mL/min/{1.73_m2} (ref >=60)
GFR, Est Non African American: 63 mL/min/{1.73_m2} (ref >=60)
Globulin: 2.6 g/dL (calc) (ref 1.9–3.7)
Glucose, Bld: 75 mg/dL (ref 65–99)
Potassium: 4.3 mmol/L (ref 3.5–5.3)
Sodium: 137 mmol/L (ref 135–146)
Total Bilirubin: 0.6 mg/dL (ref 0.2–1.2)
Total Protein: 6.9 g/dL (ref 6.1–8.1)

## 2019-07-25 LAB — CBC WITH DIFFERENTIAL/PLATELET
Absolute Monocytes: 719 cells/uL (ref 200–950)
Basophils Absolute: 40 cells/uL (ref 0–200)
Basophils Relative: 0.6 %
Eosinophils Absolute: 284 cells/uL (ref 15–500)
Eosinophils Relative: 4.3 %
HCT: 45 % (ref 35.0–45.0)
Hemoglobin: 15.2 g/dL (ref 11.7–15.5)
Lymphs Abs: 2515 cells/uL (ref 850–3900)
MCH: 31.3 pg (ref 27.0–33.0)
MCHC: 33.8 g/dL (ref 32.0–36.0)
MCV: 92.8 fL (ref 80.0–100.0)
MPV: 9.2 fL (ref 7.5–12.5)
Monocytes Relative: 10.9 %
Neutro Abs: 3043 cells/uL (ref 1500–7800)
Neutrophils Relative %: 46.1 %
Platelets: 181 10*3/uL (ref 140–400)
RBC: 4.85 10*6/uL (ref 3.80–5.10)
RDW: 11.9 % (ref 11.0–15.0)
Total Lymphocyte: 38.1 %
WBC: 6.6 10*3/uL (ref 3.8–10.8)

## 2019-07-25 LAB — LIPID PANEL
Cholesterol: 213 mg/dL — ABNORMAL HIGH (ref <200)
HDL: 87 mg/dL (ref >=50)
LDL Cholesterol (Calc): 109 mg/dL (calc) — ABNORMAL HIGH
Non-HDL Cholesterol (Calc): 126 mg/dL (calc) (ref <130)
Total CHOL/HDL Ratio: 2.4 (calc) (ref <5.0)
Triglycerides: 79 mg/dL (ref <150)

## 2019-07-28 DIAGNOSIS — H02403 Unspecified ptosis of bilateral eyelids: Secondary | ICD-10-CM | POA: Diagnosis not present

## 2019-07-28 DIAGNOSIS — Z961 Presence of intraocular lens: Secondary | ICD-10-CM | POA: Diagnosis not present

## 2019-07-28 DIAGNOSIS — Z947 Corneal transplant status: Secondary | ICD-10-CM | POA: Diagnosis not present

## 2019-07-30 ENCOUNTER — Encounter: Payer: Self-pay | Admitting: Internal Medicine

## 2019-07-30 ENCOUNTER — Other Ambulatory Visit: Payer: Self-pay

## 2019-07-30 ENCOUNTER — Non-Acute Institutional Stay: Payer: Medicare Other | Admitting: Internal Medicine

## 2019-07-30 VITALS — BP 130/66 | HR 69 | Temp 97.5°F | Ht 61.0 in | Wt 119.6 lb

## 2019-07-30 DIAGNOSIS — E559 Vitamin D deficiency, unspecified: Secondary | ICD-10-CM | POA: Diagnosis not present

## 2019-07-30 DIAGNOSIS — I10 Essential (primary) hypertension: Secondary | ICD-10-CM | POA: Diagnosis not present

## 2019-07-30 DIAGNOSIS — E785 Hyperlipidemia, unspecified: Secondary | ICD-10-CM

## 2019-07-30 DIAGNOSIS — M810 Age-related osteoporosis without current pathological fracture: Secondary | ICD-10-CM

## 2019-07-30 MED ORDER — DENOSUMAB 60 MG/ML ~~LOC~~ SOSY: 60.0000 mg | PREFILLED_SYRINGE | Freq: Once | SUBCUTANEOUS | Status: DC

## 2019-07-30 NOTE — Progress Notes (Signed)
Location:  [place] of Service:  Clinic (12)  Provider:   Code Status:  Goals of Care:  Advanced Directives 01/29/2019  Does Patient Have a Medical Advance Directive? Yes  Type of Paramedic of [place];Living will  Does patient want to make changes to medical advance directive? No - Patient declined  Copy of [place] in Chart? Yes - validated most recent copy scanned in chart (See row information)     Chief Complaint  Patient presents with  . Medical Management of Chronic Issues    6 month follow up. She believes it to be a fungus on her right index finger.  Wants to know discuss her taking a probiotic and alzhiemers.  . Health Maintenance    PNA    HPI: Patient is a 84 y.o. female seen today for medical management of chronic diseases.    Patient has a history of coronary artery disease, hypertension, hyperlipidemia, thyroid nodule, osteoporosis and allergic rhinitis, corneal dystrophy s/p Corneal Transplant,GERD  Came in for regular exam. Osteoporosis Get Prolia shot today Last DEXA scan on 11/19  T Score -3.3  Worried about her Memory She says she has noticed few times that she forgot. But nothing major. Is still independent with her Meds. Not driving anymore due to her eyes. Rash in vaginal area C/o Itching in vaginal area. With no discharge  No falls recently. Walks with no assist. Lives with her husband    Past Medical History:  Diagnosis Date  . Allergy    seasonal  . Anemia   . Arthritis   . Benign neoplasm of descending colon 10/22/2014  . CAD (coronary artery disease)    LAD lession   . Cancer ([place])    skin  . Cataract    removed bilaterally  . Diverticulosis 11/00  . Esophagitis, acute   . Fuch's endothelial dystrophy 04/2009   Dr, Shanon Rosser   . Gastritis 11/00  . GERD (gastroesophageal reflux disease) 11/00  . Hemorrhoids 8/05  . Hyperlipidemia   . Hypertension   . Lung nodule 1/09  .  NSVD (normal spontaneous vaginal delivery) 1967   female   . Osteopenia   . Osteoporosis   . Thyroid nodule 8/08    Past Surgical History:  Procedure Laterality Date  . bilateral cataract surgery  06/2007   DR. Groat   . bilateral cataract surgery  10/08   Dr. Katy Fitch   . cardiac cath 40-50%LAD  6/07  . cardiac cath with angioplasty & stent replacement  4/98  . COLONOSCOPY    . CRANIOTOMY Left 05/13/2015   Procedure: Left frontotemporal Craniotomy for Meningioma resection;  Surgeon: Consuella Lose, MD;  Location: [place] [facility];  Service: Neurosurgery;  Laterality: Left;  . CT LUNG SCREENING  6/07  . FACIAL COSMETIC SURGERY  7/96   Dr. Mikle Bosworth    . MENISCUS REPAIR      Allergies  Allergen Reactions  . Sulfonamide Derivatives   . K+ Care [Potassium Chloride] Diarrhea  . Lipitor [Atorvastatin Calcium]     Myalgias    . Sulfa Antibiotics Rash    Fever blisters    Outpatient Encounter Medications as of 07/30/2019  Medication Sig  . ammonium lactate (AMLACTIN) 12 % cream Apply topically at bedtime as needed.  Marland Kitchen aspirin EC 81 MG tablet Take 81 mg by mouth daily.  . Cholecalciferol (VITAMIN D3) 1000 UNITS CAPS Take 2,000 Units by mouth daily.   Marland Kitchen co-enzyme  Q-10 30 MG capsule Take 1 capsule (30 mg total) by mouth daily.  . colesevelam (WELCHOL) 625 MG tablet Take 3 tablets (1,875 mg total) by mouth 2 (two) times daily with a meal.  . denosumab (PROLIA) 60 MG/ML SOLN injection Inject 60 mg into the skin every 6 (six) months. Administer in upper arm, thigh, or abdomen  . fluticasone (FLONASE) 50 MCG/ACT nasal spray USE 1 TO 2 SPRAYS IN EACH  NOSTRIL DAILY AS DIRECTED  FOR ALLERGIC RHINITIS  . hydrochlorothiazide (MICROZIDE) 12.5 MG capsule Take 1 capsule (12.5 mg total) by mouth daily.  Marland Kitchen L-Lysine 1000 MG TABS Take 1,000 mg by mouth daily.  Marland Kitchen loratadine (CLARITIN) 10 MG tablet Take 1 tablet (10 mg total) by mouth daily. (Patient taking differently: Take 10 mg by mouth daily as  needed. )  . magnesium oxide (MAG-OX) 400 MG tablet Take 400 mg by mouth daily.  . Multiple Vitamin (MULTI-VITAMIN PO) Take 1 tablet by mouth daily.   Marland Kitchen omega-3 acid ethyl esters (LOVAZA) 1 g capsule TAKE 2 CAPSULES (=2GM)     TWICE DAILY  . polyethylene glycol powder (GLYCOLAX/MIRALAX) powder Take 17 g by mouth daily as needed for moderate constipation.  . pravastatin (PRAVACHOL) 80 MG tablet Take 1 tablet (80 mg total) by mouth daily.  Marland Kitchen triamcinolone cream (KENALOG) 0.1 % Apply topically daily as needed.  . [DISCONTINUED] valACYclovir (VALTREX) 1000 MG tablet Take 1,000 mg by mouth as needed (for fever blisters).   No facility-administered encounter medications on file as of 07/30/2019.     Review of Systems:  Review of Systems  All other systems reviewed and are negative.  Review of Systems  Constitutional: Negative for activity change, appetite change, chills, diaphoresis, fatigue and fever.  HENT: Negative for mouth sores, postnasal drip, rhinorrhea, sinus pain and sore throat.   Respiratory: Negative for apnea, cough, chest tightness, shortness of breath and wheezing.   Cardiovascular: Negative for chest pain, palpitations and leg swelling.  Gastrointestinal: Negative for abdominal distention, abdominal pain, constipation, diarrhea, nausea and vomiting.  Genitourinary: Negative for dysuria and frequency. C/O Itching in vaginal area Musculoskeletal: Negative for arthralgias, joint swelling and myalgias.  Skin: Negative for rash.  Neurological: Negative for dizziness, syncope, weakness, light-headedness and numbness.  Having some Memory Psychiatric/Behavioral: Negative for behavioral problems, confusion and sleep disturbance.    Health Maintenance  Topic Date Due  . PNA vac Low Risk Adult (2 of 2 - PPSV23) 08/21/2014  . MAMMOGRAM  03/31/2020  . DEXA SCAN  08/14/2020  . TETANUS/TDAP  04/01/2021  . INFLUENZA VACCINE  Completed    Physical Exam: Vitals:   07/30/19 1400   BP: 130/66  Pulse: 69  Temp: (!) 97.5 F (36.4 C)  SpO2: 94%  Weight: 119 lb 9.6 oz (54.3 kg)  Height: 5\' 1"  (1.549 m)   Body mass index is 22.6 kg/m. Physical Exam  Constitutional: Oriented to person, place, and time. Well-developed and well-nourished.  HENT:  Head: Normocephalic.  Mouth/Throat: Oropharynx is clear and moist.  Eyes: Pupils are equal, round, and reactive to light.  Neck: Neck supple.  Cardiovascular: Normal rate and normal heart sounds.  No murmur heard. Pulmonary/Chest: Effort normal and breath sounds normal. No respiratory distress. No wheezes. She has no rales.  Abdominal: Soft. Bowel sounds are normal. No distension. There is no tenderness. There is no rebound.  Vaginal area examined with the Nurse Has redness around the vaginal area no discharge Musculoskeletal: No edema.  Lymphadenopathy: none Neurological: Alert and  oriented to person, place, and time. Gait stable Skin: Skin is warm and dry.  Psychiatric: Normal mood and affect. Behavior is normal. Thought content normal.    Labs reviewed: Basic Metabolic Panel: Recent Labs    09/11/18 0000 02/03/19 0800 07/24/19 0000  NA 139 141 137  K 4.1 4.2 4.3  CL 102 104 101  CO2 30 30 29   GLUCOSE 72 79 75  BUN 19 21 21   CREATININE 1.05* 0.88 0.86  CALCIUM 9.7 9.7 10.0  TSH  --  1.71  --    Liver Function Tests: Recent Labs    09/11/18 0000 02/03/19 0800 07/24/19 0000  AST 26 23 30   ALT 12 12 12   BILITOT 0.6 0.6 0.6  PROT 6.5 6.9 6.9   No results for input(s): LIPASE, AMYLASE in the last 8760 hours. No results for input(s): AMMONIA in the last 8760 hours. CBC: Recent Labs    02/03/19 0800 07/24/19 0000  WBC 6.9 6.6  NEUTROABS 2,670 3,043  HGB 14.6 15.2  HCT 42.6 45.0  MCV 94.0 92.8  PLT 182 181   Lipid Panel: Recent Labs    09/11/18 0000 02/03/19 0800 07/24/19 0000  CHOL 216* 215* 213*  HDL 87 79 87  LDLCALC 114* 117* 109*  TRIG 67 87 79  CHOLHDL 2.5 2.7 2.4   No  results found for: HGBA1C  Procedures since last visit: No results found.  Assessment/Plan . Osteoporosis, postmenopausal -  Last DEXA scan done in 11/19 showed T score of -3.3 Patient  said that she took it for 2 years since 2015 but then there was a time when she was off it and was recently restarted. Continue Prolia for now.  Repeat DEXA scan in 2 years   Plan: denosumab (PROLIA) injection 60 mg  Essential hypertension BP stable On Microzide  CAD On aspirin and statin Follows with [facility] Allergic rhinitis, On claritin and flonase Bilateral carotid artery stenosis  THYROID NODULE TSH normal in 5/20  Vitamin D deficiency Has been Normal   Hyperlipidemia,  LDL midily elevated On statin and welchol and Lovaza  Vaginla rash Use miconazole OTC ? Memory issues Will need MMSE next visit She is worried as she her sister got diagnosed with ementia Reassured patient    Labs/tests ordered:  * No order type specified * Next appt:  Visit date not found   Total time spent in this patient care encounter was  40_  minutes; greater than 50% of the visit spent counseling patient and staff, reviewing records , Labs and coordinating care for problems addressed at this encounter.

## 2019-07-31 DIAGNOSIS — E785 Hyperlipidemia, unspecified: Secondary | ICD-10-CM | POA: Diagnosis not present

## 2019-07-31 DIAGNOSIS — E559 Vitamin D deficiency, unspecified: Secondary | ICD-10-CM | POA: Diagnosis not present

## 2019-07-31 DIAGNOSIS — I10 Essential (primary) hypertension: Secondary | ICD-10-CM | POA: Diagnosis not present

## 2019-07-31 DIAGNOSIS — M810 Age-related osteoporosis without current pathological fracture: Secondary | ICD-10-CM | POA: Diagnosis not present

## 2019-07-31 MED ORDER — PRAVASTATIN SODIUM 80 MG PO TABS: 80.0000 mg | ORAL_TABLET | Freq: Every day | ORAL | 1 refills | Status: DC

## 2019-07-31 MED ORDER — NYSTATIN 100000 UNIT/GM EX CREA: TOPICAL_CREAM | Freq: Two times a day (BID) | CUTANEOUS | Status: DC

## 2019-07-31 MED ORDER — COLESEVELAM HCL 625 MG PO TABS: 1875.0000 mg | ORAL_TABLET | Freq: Two times a day (BID) | ORAL | 3 refills | Status: DC

## 2019-07-31 MED ORDER — HYDROCHLOROTHIAZIDE 12.5 MG PO CAPS: 12.5000 mg | ORAL_CAPSULE | Freq: Every day | ORAL | 1 refills | Status: DC

## 2019-07-31 MED ORDER — DENOSUMAB 60 MG/ML ~~LOC~~ SOSY
60.0000 mg | PREFILLED_SYRINGE | Freq: Once | SUBCUTANEOUS | Status: AC
  Administered 2019-07-31: 60 mg via SUBCUTANEOUS

## 2019-07-31 MED ORDER — PNEUMOVAX 23 25 MCG/0.5ML IJ INJ: 0.5000 mL | INJECTION | Freq: Once | INTRAMUSCULAR | 0 refills | Status: AC

## 2019-07-31 MED ORDER — OMEGA-3-ACID ETHYL ESTERS 1 G PO CAPS: ORAL_CAPSULE | ORAL | 1 refills | Status: DC

## 2019-08-07 DIAGNOSIS — Z23 Encounter for immunization: Secondary | ICD-10-CM | POA: Diagnosis not present

## 2019-09-24 ENCOUNTER — Other Ambulatory Visit: Payer: Self-pay

## 2019-09-24 MED ORDER — FLUTICASONE PROPIONATE 50 MCG/ACT NA SUSP: NASAL | 1 refills | Status: DC

## 2019-10-06 DIAGNOSIS — Z23 Encounter for immunization: Secondary | ICD-10-CM | POA: Diagnosis not present

## 2019-10-29 ENCOUNTER — Encounter: Payer: Self-pay | Admitting: Internal Medicine

## 2019-11-03 DIAGNOSIS — Z23 Encounter for immunization: Secondary | ICD-10-CM | POA: Diagnosis not present

## 2019-11-28 DIAGNOSIS — Z947 Corneal transplant status: Secondary | ICD-10-CM | POA: Diagnosis not present

## 2019-11-28 DIAGNOSIS — Z961 Presence of intraocular lens: Secondary | ICD-10-CM | POA: Diagnosis not present

## 2019-11-28 DIAGNOSIS — H02403 Unspecified ptosis of bilateral eyelids: Secondary | ICD-10-CM | POA: Diagnosis not present

## 2020-01-01 ENCOUNTER — Telehealth: Payer: Self-pay

## 2020-01-01 NOTE — Telephone Encounter (Signed)
According to Marcia Brash the [place] nurse:  She can give it to her. She will need an order from Dr. Lyndel Safe and Olegario Shearer will need to call me for an appointment. She is in clinic on Monday's and Friday's so depending on when the injections are due will determine the appointment day. The prescription will need to be sent to pharmacy and she will have to pick it up and keep the medication with her until the appointment day. Unless it is dispensed in pre filled syringes she will need to ask the pharmacy for syringes. If Bailey Mech provide the syringe there will be a charge.

## 2020-01-01 NOTE — Telephone Encounter (Signed)
-----   Message from Quay Burow sent at 12/31/2019 12:21 PM EDT ----- Regarding: prolia Hey there,  Yolanda [name] DOB: [birth year] is due for prolia inj anytime after 01/28/20.   She chose to continue getting her injection at [facility], so it would need to be ordered thru her pharmacy & the RN can administer.  Thanks, Kathyrn Lass

## 2020-01-07 NOTE — Telephone Encounter (Signed)
Confirmed with [facility]

## 2020-01-20 MED ORDER — DENOSUMAB 60 MG/ML ~~LOC~~ SOSY: 60.0000 mg | PREFILLED_SYRINGE | SUBCUTANEOUS | 0 refills | Status: AC

## 2020-01-21 ENCOUNTER — Other Ambulatory Visit: Payer: Self-pay | Admitting: Internal Medicine

## 2020-01-21 ENCOUNTER — Telehealth: Payer: Self-pay | Admitting: *Deleted

## 2020-01-21 DIAGNOSIS — M810 Age-related osteoporosis without current pathological fracture: Secondary | ICD-10-CM

## 2020-01-21 DIAGNOSIS — E785 Hyperlipidemia, unspecified: Secondary | ICD-10-CM | POA: Diagnosis not present

## 2020-01-21 DIAGNOSIS — I10 Essential (primary) hypertension: Secondary | ICD-10-CM

## 2020-01-21 NOTE — Telephone Encounter (Signed)
Received Prior Authorization form from [place] [phone] Fax: [phone] Filled out and placed in Dr. Steve Rattler box for signature. To be [facility] to [facility].

## 2020-01-22 ENCOUNTER — Other Ambulatory Visit: Payer: Self-pay

## 2020-01-22 DIAGNOSIS — E785 Hyperlipidemia, unspecified: Secondary | ICD-10-CM

## 2020-01-22 DIAGNOSIS — I10 Essential (primary) hypertension: Secondary | ICD-10-CM

## 2020-01-22 DIAGNOSIS — M810 Age-related osteoporosis without current pathological fracture: Secondary | ICD-10-CM

## 2020-01-23 LAB — CBC WITH DIFFERENTIAL/PLATELET
Absolute Monocytes: 637 cells/uL (ref 200–950)
Basophils Absolute: 39 cells/uL (ref 0–200)
Basophils Relative: 0.6 %
Eosinophils Absolute: 280 cells/uL (ref 15–500)
Eosinophils Relative: 4.3 %
HCT: 44.6 % (ref 35.0–45.0)
Hemoglobin: 14.7 g/dL (ref 11.7–15.5)
Lymphs Abs: 2678 cells/uL (ref 850–3900)
MCH: 30.8 pg (ref 27.0–33.0)
MCHC: 33 g/dL (ref 32.0–36.0)
MCV: 93.3 fL (ref 80.0–100.0)
MPV: 9.7 fL (ref 7.5–12.5)
Monocytes Relative: 9.8 %
Neutro Abs: 2867 cells/uL (ref 1500–7800)
Neutrophils Relative %: 44.1 %
Platelets: 181 10*3/uL (ref 140–400)
RBC: 4.78 10*6/uL (ref 3.80–5.10)
RDW: 11.9 % (ref 11.0–15.0)
Total Lymphocyte: 41.2 %
WBC: 6.5 10*3/uL (ref 3.8–10.8)

## 2020-01-23 LAB — COMPLETE METABOLIC PANEL WITH GFR
AG Ratio: 1.6 (calc) (ref 1.0–2.5)
ALT: 14 U/L (ref 6–29)
AST: 28 U/L (ref 10–35)
Albumin: 4.2 g/dL (ref 3.6–5.1)
Alkaline phosphatase (APISO): 35 U/L — ABNORMAL LOW (ref 37–153)
BUN/Creatinine Ratio: 21 (calc) (ref 6–22)
BUN: 21 mg/dL (ref 7–25)
CO2: 28 mmol/L (ref 20–32)
Calcium: 9.9 mg/dL (ref 8.6–10.4)
Chloride: 102 mmol/L (ref 98–110)
Creat: 1 mg/dL — ABNORMAL HIGH (ref 0.60–0.88)
GFR, Est African American: 60 mL/min/{1.73_m2} (ref >=60)
GFR, Est Non African American: 52 mL/min/{1.73_m2} — ABNORMAL LOW (ref >=60)
Globulin: 2.6 g/dL (calc) (ref 1.9–3.7)
Glucose, Bld: 80 mg/dL (ref 65–99)
Potassium: 4.1 mmol/L (ref 3.5–5.3)
Sodium: 139 mmol/L (ref 135–146)
Total Bilirubin: 0.5 mg/dL (ref 0.2–1.2)
Total Protein: 6.8 g/dL (ref 6.1–8.1)

## 2020-01-23 LAB — LIPID PANEL
Cholesterol: 201 mg/dL — ABNORMAL HIGH (ref <200)
HDL: 85 mg/dL (ref >=50)
LDL Cholesterol (Calc): 99 mg/dL (calc)
Non-HDL Cholesterol (Calc): 116 mg/dL (calc) (ref <130)
Total CHOL/HDL Ratio: 2.4 (calc) (ref <5.0)
Triglycerides: 78 mg/dL (ref <150)

## 2020-01-23 LAB — TSH: TSH: 1.37 mIU/L (ref 0.40–4.50)

## 2020-01-23 LAB — VITAMIN D 25 HYDROXY (VIT D DEFICIENCY, FRACTURES): Vit D, 25-Hydroxy: 41 ng/mL (ref 30–100)

## 2020-01-23 NOTE — Telephone Encounter (Signed)
Signed by Dr. Lyndel Safe, placed in to be faxed.

## 2020-01-27 ENCOUNTER — Other Ambulatory Visit: Payer: Self-pay | Admitting: Internal Medicine

## 2020-01-28 ENCOUNTER — Non-Acute Institutional Stay: Payer: Medicare Other | Admitting: Internal Medicine

## 2020-01-28 ENCOUNTER — Other Ambulatory Visit: Payer: Self-pay

## 2020-01-28 ENCOUNTER — Encounter: Payer: Self-pay | Admitting: Internal Medicine

## 2020-01-28 VITALS — BP 134/70 | HR 82 | Temp 98.4°F | Ht 61.0 in | Wt 118.0 lb

## 2020-01-28 DIAGNOSIS — I6523 Occlusion and stenosis of bilateral carotid arteries: Secondary | ICD-10-CM | POA: Diagnosis not present

## 2020-01-28 DIAGNOSIS — E559 Vitamin D deficiency, unspecified: Secondary | ICD-10-CM | POA: Diagnosis not present

## 2020-01-28 DIAGNOSIS — M810 Age-related osteoporosis without current pathological fracture: Secondary | ICD-10-CM | POA: Diagnosis not present

## 2020-01-28 DIAGNOSIS — R413 Other amnesia: Secondary | ICD-10-CM

## 2020-01-28 DIAGNOSIS — E785 Hyperlipidemia, unspecified: Secondary | ICD-10-CM

## 2020-01-28 DIAGNOSIS — I2510 Atherosclerotic heart disease of native coronary artery without angina pectoris: Secondary | ICD-10-CM | POA: Diagnosis not present

## 2020-01-28 DIAGNOSIS — I10 Essential (primary) hypertension: Secondary | ICD-10-CM | POA: Diagnosis not present

## 2020-01-28 DIAGNOSIS — J309 Allergic rhinitis, unspecified: Secondary | ICD-10-CM | POA: Diagnosis not present

## 2020-01-28 DIAGNOSIS — D329 Benign neoplasm of meninges, unspecified: Secondary | ICD-10-CM

## 2020-01-28 DIAGNOSIS — L03011 Cellulitis of right finger: Secondary | ICD-10-CM

## 2020-01-28 MED ORDER — BETAMETHASONE DIPROPIONATE 0.05 % EX CREA: TOPICAL_CREAM | Freq: Two times a day (BID) | CUTANEOUS | 0 refills | Status: DC

## 2020-01-28 NOTE — Progress Notes (Signed)
Location:  [place] of Service:  Clinic (12)  Provider:   Code Status:  Goals of Care:  Advanced Directives 01/29/2019  Does Patient Have a Medical Advance Directive? Yes  Type of Paramedic of [place];Living will  Does patient want to make changes to medical advance directive? No - Patient declined  Copy of [place] in Chart? Yes - validated most recent copy scanned in chart (See row information)     Chief Complaint  Patient presents with  . Medical Management of Chronic Issues    Patient returns to clinic for 6 month follow up. No concerns other than a sore right finger. She is waiting for Prolia injection to be approved according to her pharmacy. Next week she will get her MRI of her head for the sharp pains.     HPI: Patient is a 85 y.o. female seen today for medical management of chronic diseases.   Patient has a history of coronary artery disease, hypertension, hyperlipidemia, thyroid nodule, osteoporosisand allergic rhinitis, corneal dystrophy s/p Corneal Transplant,GERD  Memory issues Worried about having few instances where she forgets names and numbers S/P Craniotomy for Meningioma in 2016 Per her Neurosurgeon needs repeat MRI Right finger redness and pain ? Chronic Paronychia. It gets better but then swells up and gets red again Was seen by Dermatology and she did not suggest anything Allergic Rhinitis Taking Claritin and Flonase  Overall doing well. No falls. Walks independent. No Cane or walker Lives with her husband in [place]   Past Medical History:  Diagnosis Date  . Allergy    seasonal  . Anemia   . Arthritis   . Benign neoplasm of descending colon 10/22/2014  . CAD (coronary artery disease)    LAD lession   . Cancer ([place])    skin  . Cataract    removed bilaterally  . Diverticulosis 11/00  . Esophagitis, acute   . Fuch's endothelial dystrophy 04/2009   Dr, Shanon Rosser   . Gastritis 11/00  .  GERD (gastroesophageal reflux disease) 11/00  . Hemorrhoids 8/05  . Hyperlipidemia   . Hypertension   . Lung nodule 1/09  . NSVD (normal spontaneous vaginal delivery) 1967   female   . Osteopenia   . Osteoporosis   . Thyroid nodule 8/08    Past Surgical History:  Procedure Laterality Date  . bilateral cataract surgery  06/2007   DR. Groat   . bilateral cataract surgery  10/08   Dr. Katy Fitch   . cardiac cath 40-50%LAD  6/07  . cardiac cath with angioplasty & stent replacement  4/98  . COLONOSCOPY    . CRANIOTOMY Left 05/13/2015   Procedure: Left frontotemporal Craniotomy for Meningioma resection;  Surgeon: Consuella Lose, MD;  Location: [facility];  Service: Neurosurgery;  Laterality: Left;  . CT LUNG SCREENING  6/07  . FACIAL COSMETIC SURGERY  7/96   Dr. Mikle Bosworth    . MENISCUS REPAIR      Allergies  Allergen Reactions  . Sulfonamide Derivatives   . K+ Care [Potassium Chloride] Diarrhea  . Lipitor [Atorvastatin Calcium]     Myalgias    . Sulfa Antibiotics Rash    Fever blisters    Outpatient Encounter Medications as of 01/28/2020  Medication Sig  . ammonium lactate (AMLACTIN) 12 % cream Apply topically at bedtime as needed.  Marland Kitchen aspirin EC 81 MG tablet Take 81 mg by mouth daily.  . Cholecalciferol (VITAMIN D3) 1000 UNITS  CAPS Take 2,000 Units by mouth daily.   Marland Kitchen co-enzyme Q-10 30 MG capsule Take 1 capsule (30 mg total) by mouth daily.  . colesevelam (WELCHOL) 625 MG tablet Take 3 tablets (1,875 mg total) by mouth 2 (two) times daily with a meal.  . denosumab (PROLIA) 60 MG/ML SOSY injection Inject 60 mg into the skin every 6 (six) months. In prefilled syringes.  . fluticasone (FLONASE) 50 MCG/ACT nasal spray USE 1 TO 2 SPRAYS IN EACH  NOSTRIL DAILY AS DIRECTED  FOR ALLERGIC RHINITIS  . hydrochlorothiazide (MICROZIDE) 12.5 MG capsule Take 1 capsule (12.5 mg total) by mouth daily.  Marland Kitchen L-Lysine 1000 MG TABS Take 1,000 mg by mouth daily.  Marland Kitchen loratadine (CLARITIN) 10 MG tablet  Take 1 tablet (10 mg total) by mouth daily. (Patient taking differently: Take 10 mg by mouth daily as needed. )  . magnesium oxide (MAG-OX) 400 MG tablet Take 400 mg by mouth daily.  . Multiple Vitamin (MULTI-VITAMIN PO) Take 1 tablet by mouth daily.   Marland Kitchen omega-3 acid ethyl esters (LOVAZA) 1 g capsule TAKE 2 CAPSULES (=2GM)     TWICE DAILY  . polyethylene glycol powder (GLYCOLAX/MIRALAX) powder Take 17 g by mouth daily as needed for moderate constipation.  . pravastatin (PRAVACHOL) 80 MG tablet TAKE 1 TABLET DAILY  . triamcinolone cream (KENALOG) 0.1 % Apply topically daily as needed.   Facility-Administered Encounter Medications as of 01/28/2020  Medication  . nystatin cream (MYCOSTATIN)    Review of Systems:  Review of Systems  Review of Systems  Constitutional: Negative for activity change, appetite change, chills, diaphoresis, fatigue and fever.  HENT: Negative for mouth sores, postnasal drip, rhinorrhea, sinus pain and sore throat.   Respiratory: Negative for apnea, cough, chest tightness, shortness of breath and wheezing.   Cardiovascular: Negative for chest pain, palpitations and leg swelling.  Gastrointestinal: Negative for abdominal distention, abdominal pain, constipation, diarrhea, nausea and vomiting.  Genitourinary: Negative for dysuria and frequency.  Musculoskeletal: Negative for arthralgias, joint swelling and myalgias.  Skin: Negative for rash.  Neurological: Negative for dizziness, syncope, weakness, light-headedness and numbness.  Psychiatric/Behavioral: Negative for behavioral problems, confusion and sleep disturbance.     Health Maintenance  Topic Date Due  . PNA vac Low Risk Adult (2 of 2 - PPSV23) 08/21/2014  . MAMMOGRAM  03/31/2020  . INFLUENZA VACCINE  05/02/2020  . DEXA SCAN  08/14/2020  . TETANUS/TDAP  04/01/2021  . COVID-19 Vaccine  Completed    Physical Exam: Vitals:   01/28/20 1327  BP: 134/70  Pulse: 82  Temp: 98.4 F (36.9 C)  SpO2: 96%    Weight: 118 lb (53.5 kg)  Height: 5\' 1"  (1.549 m)   Body mass index is 22.3 kg/m. Physical Exam  Constitutional: Oriented to person, place, and time. Well-developed and well-nourished.  HENT:  Head: Normocephalic.  Mouth/Throat: Oropharynx is clear and moist.  Eyes: Pupils are equal, round, and reactive to light.  Ears No wax Neck: Neck supple.  Cardiovascular: Normal rate and normal heart sounds.  No murmur heard. Pulmonary/Chest: Effort normal and breath sounds normal. No respiratory distress. No wheezes. She has no rales.  Abdominal: Soft. Bowel sounds are normal. No distension. There is no tenderness. There is no rebound.  Musculoskeletal: No edema.  Right Index finger had small redness around the nail bed. But no swelling or discharge Lymphadenopathy: none Neurological: Alert and oriented to person, place, and time. Gait was stable  Skin: Skin is warm and dry.  Psychiatric: Normal  mood and affect. Behavior is normal. Thought content normal.    Labs reviewed: Basic Metabolic Panel: Recent Labs    02/03/19 0800 07/24/19 0000 01/21/20 1053  NA 141 137 139  K 4.2 4.3 4.1  CL 104 101 102  CO2 30 29 28   GLUCOSE 79 75 80  BUN 21 21 21   CREATININE 0.88 0.86 1.00*  CALCIUM 9.7 10.0 9.9  TSH 1.71  --  1.37   Liver Function Tests: Recent Labs    02/03/19 0800 07/24/19 0000 01/21/20 1053  AST 23 30 28   ALT 12 12 14   BILITOT 0.6 0.6 0.5  PROT 6.9 6.9 6.8   No results for input(s): LIPASE, AMYLASE in the last 8760 hours. No results for input(s): AMMONIA in the last 8760 hours. CBC: Recent Labs    02/03/19 0800 07/24/19 0000 01/21/20 1053  WBC 6.9 6.6 6.5  NEUTROABS 2,670 3,043 2,867  HGB 14.6 15.2 14.7  HCT 42.6 45.0 44.6  MCV 94.0 92.8 93.3  PLT 182 181 181   Lipid Panel: Recent Labs    02/03/19 0800 07/24/19 0000 01/21/20 1053  CHOL 215* 213* 201*  HDL 79 87 85  LDLCALC 117* 109* 99  TRIG 87 79 78  CHOLHDL 2.7 2.4 2.4   No results found for:  HGBA1C  Procedures since last visit: No results found.  Assessment/Plan  Chronic paronychia of finger of right hand Try Betamethasone Ointment Told to keep it dry  Essential hypertension Controlled on Microzide BUN and Creat stable  Hyperlipidemia, unspecified hyperlipidemia type LDL less then 100 Osteoporosis, postmenopausal On Prolia Last DEXA scan done in11/19showed T score of -3.3 Patientsaid that she took it for 2 years since 2015but then there was a time when she was off it and was recently restarted. Continue Prolia for now. Repeat DEXA scan in 2 years   Vitamin D deficiency Level Normal Range On supplement Coronary artery disease involving native coronary artery of native heart without angina pectoris On aspirin and statin Follows with CArdiology Allergic rhinitis, unspecified seasonality, unspecified trigger Claritin and Flonase Bilateral carotid artery stenosis  Meningioma ([place]) Has Follow up MRI [place] Memory deficit MMSE was 30/30 [employer] D/W patient does not want ti start anything yet    Labs/tests ordered:  * No order type specified * Next appt:  Visit date not found

## 2020-02-05 DIAGNOSIS — D320 Benign neoplasm of cerebral meninges: Secondary | ICD-10-CM | POA: Diagnosis not present

## 2020-02-05 DIAGNOSIS — D329 Benign neoplasm of meninges, unspecified: Secondary | ICD-10-CM | POA: Diagnosis not present

## 2020-02-11 ENCOUNTER — Telehealth: Payer: Self-pay | Admitting: Cardiology

## 2020-02-11 NOTE — Telephone Encounter (Signed)
Patient wanted to know if it was OK for her to cancel her upcoming Carotid scan. She has had one every year and there is never anything to be seen. She wanted to make sure it was OK with Dr. Percival Spanish before she cancelled

## 2020-02-12 NOTE — Telephone Encounter (Signed)
OK 

## 2020-02-16 ENCOUNTER — Ambulatory Visit (INDEPENDENT_AMBULATORY_CARE_PROVIDER_SITE_OTHER): Payer: Medicare Other

## 2020-02-16 ENCOUNTER — Other Ambulatory Visit: Payer: Self-pay

## 2020-02-16 DIAGNOSIS — M810 Age-related osteoporosis without current pathological fracture: Secondary | ICD-10-CM

## 2020-02-16 MED ORDER — DENOSUMAB 60 MG/ML ~~LOC~~ SOSY
60.0000 mg | PREFILLED_SYRINGE | Freq: Once | SUBCUTANEOUS | Status: AC
  Administered 2020-02-16: 60 mg via SUBCUTANEOUS

## 2020-02-16 NOTE — Telephone Encounter (Signed)
Left detailed message per DPR. Upcoming carotid doppler appointment cancelled per request, okayed with Dr. Percival Spanish.

## 2020-02-23 DIAGNOSIS — D329 Benign neoplasm of meninges, unspecified: Secondary | ICD-10-CM | POA: Diagnosis not present

## 2020-02-23 DIAGNOSIS — I10 Essential (primary) hypertension: Secondary | ICD-10-CM | POA: Diagnosis not present

## 2020-03-04 ENCOUNTER — Other Ambulatory Visit: Payer: Self-pay | Admitting: Internal Medicine

## 2020-03-05 ENCOUNTER — Other Ambulatory Visit: Payer: Self-pay | Admitting: Internal Medicine

## 2020-03-05 DIAGNOSIS — Z1231 Encounter for screening mammogram for malignant neoplasm of breast: Secondary | ICD-10-CM

## 2020-03-12 ENCOUNTER — Encounter (HOSPITAL_COMMUNITY): Payer: Medicare Other

## 2020-03-16 ENCOUNTER — Encounter (HOSPITAL_COMMUNITY): Payer: Medicare Other

## 2020-04-07 ENCOUNTER — Ambulatory Visit
Admission: RE | Admit: 2020-04-07 | Discharge: 2020-04-07 | Disposition: A | Discharge status: Home / Self Care | Payer: Medicare Other | Source: Ambulatory Visit | Attending: Internal Medicine | Admitting: Internal Medicine

## 2020-04-07 ENCOUNTER — Other Ambulatory Visit: Payer: Self-pay

## 2020-04-07 DIAGNOSIS — Z1231 Encounter for screening mammogram for malignant neoplasm of breast: Secondary | ICD-10-CM

## 2020-06-02 DIAGNOSIS — Z947 Corneal transplant status: Secondary | ICD-10-CM | POA: Diagnosis not present

## 2020-06-02 DIAGNOSIS — Z961 Presence of intraocular lens: Secondary | ICD-10-CM | POA: Diagnosis not present

## 2020-06-02 DIAGNOSIS — H18519 Endothelial corneal dystrophy, unspecified eye: Secondary | ICD-10-CM | POA: Diagnosis not present

## 2020-06-02 DIAGNOSIS — H02403 Unspecified ptosis of bilateral eyelids: Secondary | ICD-10-CM | POA: Diagnosis not present

## 2020-06-13 DIAGNOSIS — Z7189 Other specified counseling: Secondary | ICD-10-CM | POA: Insufficient documentation

## 2020-06-13 NOTE — Progress Notes (Signed)
Cardiology Office Note   Date:  06/14/2020   ID:  Dwana Melena, DOB 10/23/1934, MRN [id number]  PCP:  Virgie Dad, MD  Cardiologist:   Minus Breeding, MD   Chief Complaint  Patient presents with  . Coronary Artery Disease      History of Present Illness: Yolanda [name] is a 85 y.o. female who presents for follow up of CAD.  Since I last saw her she has done OK.  The patient denies any new symptoms such as chest discomfort, neck or arm discomfort. There has been no new shortness of breath, PND or orthopnea. There have been no reported palpitations, presyncope or syncope.  She does a lot of walking at a friend's home with her husband.  She denies any cardiovascular symptoms.  Past Medical History:  Diagnosis Date  . Allergy    seasonal  . Anemia   . Arthritis   . Benign neoplasm of descending colon 10/22/2014  . CAD (coronary artery disease)    LAD lession   . Cancer ([place])    skin  . Cataract    removed bilaterally  . Diverticulosis 11/00  . Esophagitis, acute   . Fuch's endothelial dystrophy 04/2009   Dr, Shanon Rosser   . Gastritis 11/00  . GERD (gastroesophageal reflux disease) 11/00  . Hemorrhoids 8/05  . Hyperlipidemia   . Hypertension   . Lung nodule 1/09  . NSVD (normal spontaneous vaginal delivery) 1967   female   . Osteopenia   . Osteoporosis   . Thyroid nodule 8/08    Past Surgical History:  Procedure Laterality Date  . bilateral cataract surgery  06/2007   DR. Groat   . bilateral cataract surgery  10/08   Dr. Katy Fitch   . cardiac cath 40-50%LAD  6/07  . cardiac cath with angioplasty & stent replacement  4/98  . COLONOSCOPY    . CRANIOTOMY Left 05/13/2015   Procedure: Left frontotemporal Craniotomy for Meningioma resection;  Surgeon: Consuella Lose, MD;  Location: [facility];  Service: Neurosurgery;  Laterality: Left;  . CT LUNG SCREENING  6/07  . FACIAL COSMETIC SURGERY  7/96   Dr. Mikle Bosworth    . MENISCUS REPAIR       Current  Outpatient Medications  Medication Sig Dispense Refill  . ammonium lactate (AMLACTIN) 12 % cream Apply topically at bedtime as needed.    Marland Kitchen aspirin EC 81 MG tablet Take 81 mg by mouth daily.    . betamethasone dipropionate 0.05 % cream Apply topically 2 (two) times daily. Apply to the Base of the Nail on red area for 2 weeks. If it is not better let us know 30 g 0  . Cholecalciferol (VITAMIN D3) 1000 UNITS CAPS Take 2,000 Units by mouth daily.     Marland Kitchen co-enzyme Q-10 30 MG capsule Take 1 capsule (30 mg total) by mouth daily. 90 capsule 1  . colesevelam (WELCHOL) 625 MG tablet Take 3 tablets (1,875 mg total) by mouth 2 (two) times daily with a meal. 540 tablet 3  . denosumab (PROLIA) 60 MG/ML SOSY injection Inject 60 mg into the skin every 6 (six) months. In prefilled syringes. 1 mL 0  . fluticasone (FLONASE) 50 MCG/ACT nasal spray USE 1 TO 2 SPRAYS IN EACH  NOSTRIL DAILY AS DIRECTED  FOR ALLERGIC RHINITIS 48 g 1  . hydrochlorothiazide (MICROZIDE) 12.5 MG capsule TAKE 1 CAPSULE DAILY 90 capsule 1  . L-Lysine 1000 MG TABS Take 1,000 mg by mouth daily.    Marland Kitchen  loratadine (CLARITIN) 10 MG tablet Take 1 tablet (10 mg total) by mouth daily. (Patient taking differently: Take 10 mg by mouth daily as needed. ) 30 tablet 11  . magnesium oxide (MAG-OX) 400 MG tablet Take 400 mg by mouth daily.    . Multiple Vitamin (MULTI-VITAMIN PO) Take 1 tablet by mouth daily.     Marland Kitchen omega-3 acid ethyl esters (LOVAZA) 1 g capsule TAKE 2 CAPSULES (=2GM)     TWICE DAILY 360 capsule 1  . polyethylene glycol powder (GLYCOLAX/MIRALAX) powder Take 17 g by mouth daily as needed for moderate constipation. 3350 g 1  . pravastatin (PRAVACHOL) 80 MG tablet TAKE 1 TABLET DAILY 90 tablet 1  . triamcinolone cream (KENALOG) 0.1 % Apply topically daily as needed.     Current Facility-Administered Medications  Medication Dose Route Frequency Provider Last Rate Last Admin  . nystatin cream (MYCOSTATIN)   Topical BID Virgie Dad, MD         Allergies:   Sulfonamide derivatives, K+ care [potassium chloride], Lipitor [atorvastatin calcium], and Sulfa antibiotics    ROS:  Please see the history of present illness.   Otherwise, review of systems are positive for none.   All other systems are reviewed and negative.    PHYSICAL EXAM: VS:  BP 122/82   Pulse (!) 58   Ht 5\' 1"  (1.549 m)   Wt 121 lb 6.4 oz (55.1 kg)   SpO2 99%   BMI 22.94 kg/m  , BMI Body mass index is 22.94 kg/m. GENERAL:  Well appearing NECK:  No jugular venous distention, waveform within normal limits, carotid upstroke brisk and symmetric, no bruits, no thyromegaly LUNGS:  Clear to auscultation bilaterally CHEST:  Unremarkable HEART:  PMI not displaced or sustained,S1 and S2 within normal limits, no S3, no S4, no clicks, no rubs, no murmurs ABD:  Flat, positive bowel sounds normal in frequency in pitch, no bruits, no rebound, no guarding, no midline pulsatile mass, no hepatomegaly, no splenomegaly EXT:  2 plus pulses throughout, no edema, no cyanosis no clubbing'   EKG:  EKG is ordered today. The ekg ordered today demonstrates sinus rhythm, rate 58, axis within normal limits, low voltage in the limb leads, poor anterior R wave progression.   Recent Labs: 01/21/2020: ALT 14; BUN 21; Creat 1.00; Hemoglobin 14.7; Platelets 181; Potassium 4.1; Sodium 139; TSH 1.37    Lipid Panel    Component Value Date/Time   CHOL 201 (H) 01/21/2020 1053   CHOL 178 05/02/2017 0944   CHOL 192 04/02/2013 0930   TRIG 78 01/21/2020 1053   TRIG 57 12/20/2016 1058   TRIG 78 04/02/2013 0930   HDL 85 01/21/2020 1053   HDL 94 05/02/2017 0944   HDL 89 12/20/2016 1058   HDL 82 04/02/2013 0930   CHOLHDL 2.4 01/21/2020 1053   LDLCALC 99 01/21/2020 1053   LDLCALC 89 04/14/2014 1011   LDLCALC 94 04/02/2013 0930      Wt Readings from Last 3 Encounters:  06/14/20 121 lb 6.4 oz (55.1 kg)  01/28/20 118 lb (53.5 kg)  07/30/19 119 lb 9.6 oz (54.3 kg)      Other studies  Reviewed: Additional studies/ records that were reviewed today include: Labs. Review of the above records demonstrates:  Please see elsewhere in the note.     ASSESSMENT AND PLAN:  CAD, UNSPECIFIED SITE -  The patient has no new sypmtoms.  No further cardiovascular testing is indicated.  We will continue with aggressive risk reduction  and meds as listed.  CAROTID STENOSIS -  She had 0-39% bilateral stenosis.  I will check this again in 2023  HYPERTENSION, UNSPECIFIED -  The blood pressure is at target.  No change in therapy.  HYPERLIPIDEMIA-MIXED -  Her LDL is not quite at target but her HDL is excellent.  She tolerates this regimen and has been intolerant of some other medications.  I will leave her on this.   COVID EDUCATION -  She has had her vaccine.   Current medicines are reviewed at length with the patient today.  The patient does not have concerns regarding medicines.  The following changes have been made:  no change  Labs/ tests ordered today include: None  Orders Placed This Encounter  Procedures  . EKG 12-Lead     Disposition:   FU with me in one year.     Signed, Minus Breeding, MD  06/14/2020 12:31 PM    [facility]

## 2020-06-14 ENCOUNTER — Ambulatory Visit (INDEPENDENT_AMBULATORY_CARE_PROVIDER_SITE_OTHER): Payer: Medicare Other | Admitting: Cardiology

## 2020-06-14 ENCOUNTER — Other Ambulatory Visit: Payer: Self-pay

## 2020-06-14 ENCOUNTER — Encounter: Payer: Self-pay | Admitting: Cardiology

## 2020-06-14 VITALS — BP 122/82 | HR 58 | Ht 61.0 in | Wt 121.4 lb

## 2020-06-14 DIAGNOSIS — Z7189 Other specified counseling: Secondary | ICD-10-CM

## 2020-06-14 DIAGNOSIS — I6523 Occlusion and stenosis of bilateral carotid arteries: Secondary | ICD-10-CM | POA: Diagnosis not present

## 2020-06-14 DIAGNOSIS — E785 Hyperlipidemia, unspecified: Secondary | ICD-10-CM | POA: Diagnosis not present

## 2020-06-14 DIAGNOSIS — I10 Essential (primary) hypertension: Secondary | ICD-10-CM | POA: Diagnosis not present

## 2020-06-14 DIAGNOSIS — I2510 Atherosclerotic heart disease of native coronary artery without angina pectoris: Secondary | ICD-10-CM | POA: Diagnosis not present

## 2020-06-14 NOTE — Patient Instructions (Signed)
Medication Instructions:  No changes *If you need a refill on your cardiac medications before your next appointment, please call your pharmacy*   Lab Work: Not needed If you have labs (blood work) drawn today and your tests are completely normal, you will receive your results only by: Marland Kitchen MyChart Message (if you have MyChart) OR . A paper copy in the mail If you have any lab test that is abnormal or we need to change your treatment, we will call you to review the results.   Testing/Procedures: Not needed   Follow-Up: At [facility], you and your health needs are our priority.  As part of our continuing mission to provide you with exceptional heart care, we have created designated Provider Care Teams.  These Care Teams include your primary Cardiologist (physician) and Advanced Practice Providers (APPs -  Physician Assistants and Nurse Practitioners) who all work together to provide you with the care you need, when you need it.    Your next appointment:   12 month(s)  The format for your next appointment:   In Person  Provider:   Minus Breeding, MD

## 2020-07-21 ENCOUNTER — Other Ambulatory Visit: Payer: Self-pay | Admitting: Internal Medicine

## 2020-07-21 DIAGNOSIS — I10 Essential (primary) hypertension: Secondary | ICD-10-CM

## 2020-07-21 DIAGNOSIS — E785 Hyperlipidemia, unspecified: Secondary | ICD-10-CM

## 2020-07-21 DIAGNOSIS — I2510 Atherosclerotic heart disease of native coronary artery without angina pectoris: Secondary | ICD-10-CM

## 2020-07-22 ENCOUNTER — Other Ambulatory Visit: Payer: Self-pay

## 2020-07-22 DIAGNOSIS — I2510 Atherosclerotic heart disease of native coronary artery without angina pectoris: Secondary | ICD-10-CM | POA: Diagnosis not present

## 2020-07-22 DIAGNOSIS — E785 Hyperlipidemia, unspecified: Secondary | ICD-10-CM | POA: Diagnosis not present

## 2020-07-22 DIAGNOSIS — I10 Essential (primary) hypertension: Secondary | ICD-10-CM | POA: Diagnosis not present

## 2020-07-23 LAB — LIPID PANEL
Cholesterol: 184 mg/dL (ref <200)
HDL: 90 mg/dL (ref >=50)
LDL Cholesterol (Calc): 79 mg/dL (calc)
Non-HDL Cholesterol (Calc): 94 mg/dL (calc) (ref <130)
Total CHOL/HDL Ratio: 2 (calc) (ref <5.0)
Triglycerides: 70 mg/dL (ref <150)

## 2020-07-23 LAB — CBC WITH DIFFERENTIAL/PLATELET
Absolute Monocytes: 669 cells/uL (ref 200–950)
Basophils Absolute: 48 cells/uL (ref 0–200)
Basophils Relative: 0.7 %
Eosinophils Absolute: 400 cells/uL (ref 15–500)
Eosinophils Relative: 5.8 %
HCT: 43.5 % (ref 35.0–45.0)
Hemoglobin: 14.4 g/dL (ref 11.7–15.5)
Lymphs Abs: 2705 cells/uL (ref 850–3900)
MCH: 31 pg (ref 27.0–33.0)
MCHC: 33.1 g/dL (ref 32.0–36.0)
MCV: 93.8 fL (ref 80.0–100.0)
MPV: 9 fL (ref 7.5–12.5)
Monocytes Relative: 9.7 %
Neutro Abs: 3077 cells/uL (ref 1500–7800)
Neutrophils Relative %: 44.6 %
Platelets: 196 10*3/uL (ref 140–400)
RBC: 4.64 10*6/uL (ref 3.80–5.10)
RDW: 12.2 % (ref 11.0–15.0)
Total Lymphocyte: 39.2 %
WBC: 6.9 10*3/uL (ref 3.8–10.8)

## 2020-07-23 LAB — COMPLETE METABOLIC PANEL WITH GFR
AG Ratio: 1.7 (calc) (ref 1.0–2.5)
ALT: 13 U/L (ref 6–29)
AST: 28 U/L (ref 10–35)
Albumin: 4.1 g/dL (ref 3.6–5.1)
Alkaline phosphatase (APISO): 36 U/L — ABNORMAL LOW (ref 37–153)
BUN/Creatinine Ratio: 17 (calc) (ref 6–22)
BUN: 16 mg/dL (ref 7–25)
CO2: 27 mmol/L (ref 20–32)
Calcium: 9.6 mg/dL (ref 8.6–10.4)
Chloride: 100 mmol/L (ref 98–110)
Creat: 0.93 mg/dL — ABNORMAL HIGH (ref 0.60–0.88)
GFR, Est African American: 66 mL/min/{1.73_m2} (ref >=60)
GFR, Est Non African American: 57 mL/min/{1.73_m2} — ABNORMAL LOW (ref >=60)
Globulin: 2.4 g/dL (calc) (ref 1.9–3.7)
Glucose, Bld: 82 mg/dL (ref 65–99)
Potassium: 4.1 mmol/L (ref 3.5–5.3)
Sodium: 136 mmol/L (ref 135–146)
Total Bilirubin: 0.6 mg/dL (ref 0.2–1.2)
Total Protein: 6.5 g/dL (ref 6.1–8.1)

## 2020-07-26 ENCOUNTER — Other Ambulatory Visit: Payer: Self-pay | Admitting: Internal Medicine

## 2020-07-27 NOTE — Telephone Encounter (Signed)
It appears medication was prescribed in April as a temporary medication.  I will send to a provider for review and approval if necessary

## 2020-07-28 ENCOUNTER — Non-Acute Institutional Stay: Payer: Medicare Other | Admitting: Internal Medicine

## 2020-07-28 ENCOUNTER — Encounter: Payer: Self-pay | Admitting: Internal Medicine

## 2020-07-28 ENCOUNTER — Other Ambulatory Visit: Payer: Self-pay

## 2020-07-28 VITALS — BP 152/86 | HR 71 | Temp 96.5°F | Ht 61.0 in | Wt 121.2 lb

## 2020-07-28 DIAGNOSIS — E785 Hyperlipidemia, unspecified: Secondary | ICD-10-CM

## 2020-07-28 DIAGNOSIS — J309 Allergic rhinitis, unspecified: Secondary | ICD-10-CM | POA: Diagnosis not present

## 2020-07-28 DIAGNOSIS — I6523 Occlusion and stenosis of bilateral carotid arteries: Secondary | ICD-10-CM

## 2020-07-28 DIAGNOSIS — I10 Essential (primary) hypertension: Secondary | ICD-10-CM

## 2020-07-28 DIAGNOSIS — L299 Pruritus, unspecified: Secondary | ICD-10-CM | POA: Diagnosis not present

## 2020-07-28 DIAGNOSIS — I2510 Atherosclerotic heart disease of native coronary artery without angina pectoris: Secondary | ICD-10-CM | POA: Diagnosis not present

## 2020-07-28 DIAGNOSIS — M810 Age-related osteoporosis without current pathological fracture: Secondary | ICD-10-CM | POA: Diagnosis not present

## 2020-07-28 DIAGNOSIS — L03011 Cellulitis of right finger: Secondary | ICD-10-CM

## 2020-07-28 DIAGNOSIS — D329 Benign neoplasm of meninges, unspecified: Secondary | ICD-10-CM

## 2020-07-28 DIAGNOSIS — E559 Vitamin D deficiency, unspecified: Secondary | ICD-10-CM

## 2020-07-28 MED ORDER — COLESEVELAM HCL 625 MG PO TABS
1875.0000 mg | ORAL_TABLET | Freq: Two times a day (BID) | ORAL | 3 refills | Status: DC
Start: 2020-07-28 — End: 2023-01-22

## 2020-07-28 NOTE — Progress Notes (Signed)
Location:  [place] of Service:  Clinic (12)  Provider:   Code Status:  Goals of Care:  Advanced Directives 01/29/2019  Does Patient Have a Medical Advance Directive? Yes  Type of Paramedic of [place];Living will  Does patient want to make changes to medical advance directive? No - Patient declined  Copy of [place] in Chart? Yes - validated most recent copy scanned in chart (See row information)     Chief Complaint  Patient presents with  . Medical Management of Chronic Issues    Patient returns to the clinic for follow up. She would like to discuss a place on her nail and still itching all over.   Marland Kitchen Health Maintenance    PPSV23    HPI: Patient is a 85 y.o. female seen today for medical management of chronic diseases.    Patient has a history of coronary artery disease, hypertension, hyperlipidemia, thyroid nodule, osteoporosisand allergic rhinitis, corneal dystrophys/p Corneal Transplant,GERD S/p craniotomy for meningioma in 2016.  C/o Itching all over.  Does have a history of dermatitis and uses triamcinolone cream as per  prescribed by dermatology But the new is head itching.  She states her head itches mostly at night when she is trying to sleep. Chronic paronychia if better on betamethasone which she is using as needed Her weight is stable Walks and is independent no cane or walker Lives with her husband in [place].  Her daughter lives in [place] and works in [employer]   Past Medical History:  Diagnosis Date  . Allergy    seasonal  . Anemia   . Arthritis   . Benign neoplasm of descending colon 10/22/2014  . CAD (coronary artery disease)    LAD lession   . Cancer ([place])    skin  . Cataract    removed bilaterally  . Diverticulosis 11/00  . Esophagitis, acute   . Fuch's endothelial dystrophy 04/2009   Dr, Shanon Rosser   . Gastritis 11/00  . GERD (gastroesophageal reflux disease) 11/00  . Hemorrhoids 8/05  .  Hyperlipidemia   . Hypertension   . Lung nodule 1/09  . NSVD (normal spontaneous vaginal delivery) 1967   female   . Osteopenia   . Osteoporosis   . Thyroid nodule 8/08    Past Surgical History:  Procedure Laterality Date  . bilateral cataract surgery  06/2007   DR. Groat   . bilateral cataract surgery  10/08   Dr. Katy Fitch   . cardiac cath 40-50%LAD  6/07  . cardiac cath with angioplasty & stent replacement  4/98  . COLONOSCOPY    . CRANIOTOMY Left 05/13/2015   Procedure: Left frontotemporal Craniotomy for Meningioma resection;  Surgeon: Consuella Lose, MD;  Location: [facility];  Service: Neurosurgery;  Laterality: Left;  . CT LUNG SCREENING  6/07  . FACIAL COSMETIC SURGERY  7/96   Dr. Mikle Bosworth    . MENISCUS REPAIR      Allergies  Allergen Reactions  . Sulfonamide Derivatives   . K+ Care [Potassium Chloride] Diarrhea  . Lipitor [Atorvastatin Calcium]     Myalgias    . Sulfa Antibiotics Rash    Fever blisters    Outpatient Encounter Medications as of 07/28/2020  Medication Sig  . ammonium lactate (AMLACTIN) 12 % cream Apply topically at bedtime as needed.  Marland Kitchen aspirin EC 81 MG tablet Take 81 mg by mouth daily.  . betamethasone dipropionate 0.05 % cream APPLY 2 TIMES  DAILY TO THE BASE OF THE NAIL ON RED AREA FOR 2 WEEKS. IF IT IS NOT BETTER LET us KNOW  . Cholecalciferol (VITAMIN D3) 1000 UNITS CAPS Take 2,000 Units by mouth daily.   Marland Kitchen co-enzyme Q-10 30 MG capsule Take 1 capsule (30 mg total) by mouth daily.  . colesevelam (WELCHOL) 625 MG tablet Take 3 tablets (1,875 mg total) by mouth 2 (two) times daily with a meal.  . denosumab (PROLIA) 60 MG/ML SOSY injection Inject 60 mg into the skin every 6 (six) months. In prefilled syringes.  . fluticasone (FLONASE) 50 MCG/ACT nasal spray USE 1 TO 2 SPRAYS IN EACH  NOSTRIL DAILY AS DIRECTED  FOR ALLERGIC RHINITIS  . hydrochlorothiazide (MICROZIDE) 12.5 MG capsule TAKE 1 CAPSULE DAILY  . L-Lysine 1000 MG TABS Take 1,000 mg by  mouth daily.  Marland Kitchen loratadine (CLARITIN) 10 MG tablet Take 1 tablet (10 mg total) by mouth daily. (Patient taking differently: Take 10 mg by mouth daily as needed. )  . magnesium oxide (MAG-OX) 400 MG tablet Take 400 mg by mouth daily.  . Multiple Vitamin (MULTI-VITAMIN PO) Take 1 tablet by mouth daily.   Marland Kitchen omega-3 acid ethyl esters (LOVAZA) 1 g capsule TAKE 2 CAPSULES            TWICE DAILY  . polyethylene glycol powder (GLYCOLAX/MIRALAX) powder Take 17 g by mouth daily as needed for moderate constipation.  . pravastatin (PRAVACHOL) 80 MG tablet TAKE 1 TABLET DAILY  . triamcinolone cream (KENALOG) 0.1 % Apply topically daily as needed.   Facility-Administered Encounter Medications as of 07/28/2020  Medication  . nystatin cream (MYCOSTATIN)    Review of Systems:  Review of Systems  Review of Systems  Constitutional: Negative for activity change, appetite change, chills, diaphoresis, fatigue and fever.  HENT: Negative for mouth sores, postnasal drip, rhinorrhea, sinus pain and sore throat.   Respiratory: Negative for apnea, cough, chest tightness, shortness of breath and wheezing.   Cardiovascular: Negative for chest pain, palpitations and leg swelling.  Gastrointestinal: Negative for abdominal distention, abdominal pain, constipation, diarrhea, nausea and vomiting.  Genitourinary: Negative for dysuria and frequency.  Musculoskeletal: Negative for arthralgias, joint swelling and myalgias.  Skin: Negative for rash.  Neurological: Negative for dizziness, syncope, weakness, light-headedness and numbness.  Psychiatric/Behavioral: Negative for behavioral problems, confusion and sleep disturbance.     Health Maintenance  Topic Date Due  . PNA vac Low Risk Adult (2 of 2 - PPSV23) 08/21/2014  . DEXA SCAN  08/14/2020  . TETANUS/TDAP  04/01/2021  . MAMMOGRAM  04/07/2021  . INFLUENZA VACCINE  Completed  . COVID-19 Vaccine  Completed    Physical Exam: Vitals:   07/28/20 1253  BP: (!)  152/86  Pulse: 71  Temp: (!) 96.5 F (35.8 C)  SpO2: 97%  Weight: 121 lb 3.2 oz (55 kg)  Height: 5\' 1"  (1.549 m)   Body mass index is 22.9 kg/m. Physical Exam  Constitutional: Oriented to person, place, and time. Well-developed and well-nourished.  HENT:  Head: Normocephalic. Head did not have any signs of Dermatitis Mouth/Throat: Oropharynx is clear and moist.  Eyes: Pupils are equal, round, and reactive to light.  Ears no wax Neck: Neck supple.  Cardiovascular: Normal rate and normal heart sounds.  No murmur heard. Pulmonary/Chest: Effort normal and breath sounds normal. No respiratory distress. No wheezes. She has no rales.  Abdominal: Soft. Bowel sounds are normal. No distension. There is no tenderness. There is no rebound.  Musculoskeletal: No edema.  Lymphadenopathy: none Neurological: Alert and oriented to person, place, and time.  Gait stable Skin: Skin is warm and dry. Redness and swelling is resolved in her finger Psychiatric: Normal mood and affect. Behavior is normal. Thought content normal.    Labs reviewed: Basic Metabolic Panel: Recent Labs    01/21/20 1053 07/22/20 0745  NA 139 136  K 4.1 4.1  CL 102 100  CO2 28 27  GLUCOSE 80 82  BUN 21 16  CREATININE 1.00* 0.93*  CALCIUM 9.9 9.6  TSH 1.37  --    Liver Function Tests: Recent Labs    01/21/20 1053 07/22/20 0745  AST 28 28  ALT 14 13  BILITOT 0.5 0.6  PROT 6.8 6.5   No results for input(s): LIPASE, AMYLASE in the last 8760 hours. No results for input(s): AMMONIA in the last 8760 hours. CBC: Recent Labs    01/21/20 1053 07/22/20 0745  WBC 6.5 6.9  NEUTROABS 2,867 3,077  HGB 14.7 14.4  HCT 44.6 43.5  MCV 93.3 93.8  PLT 181 196   Lipid Panel: Recent Labs    01/21/20 1053 07/22/20 0745  CHOL 201* 184  HDL 85 90  LDLCALC 99 79  TRIG 78 70  CHOLHDL 2.4 2.0   No results found for: HGBA1C  Procedures since last visit: No results found.  Assessment/Plan 1. Essential  hypertension Was Elevated today Continue to monitor at home She will come in 3 weeks for recheck. Also will monitor at home On Microzide Bun and Creat stable  2. Coronary artery disease involving native coronary artery of native heart without angina pectoris Follows with Cardiology On Aspirin and Statin LDL less then 100  3. Hyperlipidemia, unspecified hyperlipidemia type On statin and Welchol  4. Osteoporosis, postmenopausal On Prolia Due for DEXA  5. Chronic paronychia of finger of right hand Much Better on Betamethasone PRN  6. Vitamin D deficiency Level Noirmal  7. Allergic rhinitis, unspecified seasonality, unspecified trigger On Claritin and Flonase  8. Bilateral carotid artery stenosis 0-39% Bilateral Stenosis Per Dr Jenkins [place] follow up US in 2023  9. Meningioma [facility]) Had MRI done by Neurosurgery No New recommendations  10. Itching in her head Exam Normal She will call her Dermatologist     Labs/tests ordered:  * No order type specified * Next appt:  08/18/2020

## 2020-07-29 DIAGNOSIS — Z23 Encounter for immunization: Secondary | ICD-10-CM | POA: Diagnosis not present

## 2020-08-18 ENCOUNTER — Encounter: Payer: Self-pay | Admitting: Internal Medicine

## 2020-08-19 ENCOUNTER — Other Ambulatory Visit: Payer: Self-pay

## 2020-08-19 ENCOUNTER — Ambulatory Visit (INDEPENDENT_AMBULATORY_CARE_PROVIDER_SITE_OTHER): Payer: Medicare Other

## 2020-08-19 DIAGNOSIS — M810 Age-related osteoporosis without current pathological fracture: Secondary | ICD-10-CM

## 2020-08-19 MED ORDER — DENOSUMAB 60 MG/ML ~~LOC~~ SOSY
60.0000 mg | PREFILLED_SYRINGE | Freq: Once | SUBCUTANEOUS | Status: AC
  Administered 2020-08-19: 60 mg via SUBCUTANEOUS

## 2020-08-25 ENCOUNTER — Other Ambulatory Visit: Payer: Self-pay

## 2020-08-25 ENCOUNTER — Non-Acute Institutional Stay: Payer: Medicare Other | Admitting: Internal Medicine

## 2020-08-25 ENCOUNTER — Encounter: Payer: Self-pay | Admitting: Internal Medicine

## 2020-08-25 VITALS — BP 140/70 | HR 51 | Temp 96.9°F | Ht 61.0 in | Wt 121.6 lb

## 2020-08-25 DIAGNOSIS — I10 Essential (primary) hypertension: Secondary | ICD-10-CM | POA: Diagnosis not present

## 2020-08-25 NOTE — Progress Notes (Signed)
Location:     Place of Service:     Provider:   Code Status:  Goals of Care:  Advanced Directives 01/29/2019  Does Patient Have a Medical Advance Directive? Yes  Type of Paramedic of [place];Living will  Does patient want to make changes to medical advance directive? No - Patient declined  Copy of [place] in Chart? Yes - validated most recent copy scanned in chart (See row information)     Chief Complaint  Patient presents with  . Acute Visit    Blood pressure check, no other concerns    HPI: Patient is a 85 y.o. female seen today for an acute visit for BP follow up  Patient has a history of coronary artery disease, hypertension, hyperlipidemia, thyroid nodule, osteoporosisand allergic rhinitis, corneal dystrophys/p Corneal Transplant,GERD S/p craniotomy for meningioma in 2016.  Patient has a history of hypertension.  Last visit her blood pressure was more than 150/90.  She usually states she runs around 120/60 at home.  She wanted to come back for a blood pressure check.  He is not having any problems with dizziness headache chest pain.  Past Medical History:  Diagnosis Date  . Allergy    seasonal  . Anemia   . Arthritis   . Benign neoplasm of descending colon 10/22/2014  . CAD (coronary artery disease)    LAD lession   . Cancer ([place])    skin  . Cataract    removed bilaterally  . Diverticulosis 11/00  . Esophagitis, acute   . Fuch's endothelial dystrophy 04/2009   Dr, Shanon Rosser   . Gastritis 11/00  . GERD (gastroesophageal reflux disease) 11/00  . Hemorrhoids 8/05  . Hyperlipidemia   . Hypertension   . Lung nodule 1/09  . NSVD (normal spontaneous vaginal delivery) 1967   female   . Osteopenia   . Osteoporosis   . Thyroid nodule 8/08    Past Surgical History:  Procedure Laterality Date  . bilateral cataract surgery  06/2007   DR. Groat   . bilateral cataract surgery  10/08   Dr. Katy Fitch   . cardiac cath  40-50%LAD  6/07  . cardiac cath with angioplasty & stent replacement  4/98  . COLONOSCOPY    . CRANIOTOMY Left 05/13/2015   Procedure: Left frontotemporal Craniotomy for Meningioma resection;  Surgeon: Consuella Lose, MD;  Location: [facility];  Service: Neurosurgery;  Laterality: Left;  . CT LUNG SCREENING  6/07  . FACIAL COSMETIC SURGERY  7/96   Dr. Mikle Bosworth    . MENISCUS REPAIR      Allergies  Allergen Reactions  . Sulfonamide Derivatives   . K+ Care [Potassium Chloride] Diarrhea  . Lipitor [Atorvastatin Calcium]     Myalgias    . Sulfa Antibiotics Rash    Fever blisters    Outpatient Encounter Medications as of 08/25/2020  Medication Sig  . ammonium lactate (AMLACTIN) 12 % cream Apply topically at bedtime as needed.  Marland Kitchen aspirin EC 81 MG tablet Take 81 mg by mouth daily.  . betamethasone dipropionate 0.05 % cream APPLY 2 TIMES DAILY TO THE BASE OF THE NAIL ON RED AREA FOR 2 WEEKS. IF IT IS NOT BETTER LET us KNOW  . Cholecalciferol (VITAMIN D3) 1000 UNITS CAPS Take 2,000 Units by mouth daily.   Marland Kitchen co-enzyme Q-10 30 MG capsule Take 1 capsule (30 mg total) by mouth daily.  . colesevelam (WELCHOL) 625 MG tablet Take 3 tablets (1,875 mg total)  by mouth 2 (two) times daily with a meal.  . denosumab (PROLIA) 60 MG/ML SOSY injection Inject 60 mg into the skin every 6 (six) months. In prefilled syringes.  . fluticasone (FLONASE) 50 MCG/ACT nasal spray USE 1 TO 2 SPRAYS IN EACH  NOSTRIL DAILY AS DIRECTED  FOR ALLERGIC RHINITIS  . hydrochlorothiazide (MICROZIDE) 12.5 MG capsule TAKE 1 CAPSULE DAILY  . L-Lysine 1000 MG TABS Take 1,000 mg by mouth daily.  Marland Kitchen loratadine (CLARITIN) 10 MG tablet Take 1 tablet (10 mg total) by mouth daily. (Patient taking differently: Take 10 mg by mouth daily as needed. )  . magnesium oxide (MAG-OX) 400 MG tablet Take 400 mg by mouth daily.  . Multiple Vitamin (MULTI-VITAMIN PO) Take 1 tablet by mouth daily.   Marland Kitchen omega-3 acid ethyl esters (LOVAZA) 1 g capsule  TAKE 2 CAPSULES            TWICE DAILY  . polyethylene glycol powder (GLYCOLAX/MIRALAX) powder Take 17 g by mouth daily as needed for moderate constipation.  . pravastatin (PRAVACHOL) 80 MG tablet TAKE 1 TABLET DAILY  . triamcinolone cream (KENALOG) 0.1 % Apply topically daily as needed.   Facility-Administered Encounter Medications as of 08/25/2020  Medication  . nystatin cream (MYCOSTATIN)    Review of Systems:  Review of Systems  Review of Systems  Constitutional: Negative for activity change, appetite change, chills, diaphoresis, fatigue and fever.  HENT: Negative for mouth sores, postnasal drip, rhinorrhea, sinus pain and sore throat.   Respiratory: Negative for apnea, cough, chest tightness, shortness of breath and wheezing.   Cardiovascular: Negative for chest pain, palpitations and leg swelling.  Gastrointestinal: Negative for abdominal distention, abdominal pain, constipation, diarrhea, nausea and vomiting.  Genitourinary: Negative for dysuria and frequency.  Musculoskeletal: Negative for arthralgias, joint swelling and myalgias.  Skin: Negative for rash.  Neurological: Negative for dizziness, syncope, weakness, light-headedness and numbness.  Psychiatric/Behavioral: Negative for behavioral problems, confusion and sleep disturbance.     Health Maintenance  Topic Date Due  . PNA vac Low Risk Adult (2 of 2 - PPSV23) 08/21/2014  . DEXA SCAN  08/14/2020  . TETANUS/TDAP  04/01/2021  . MAMMOGRAM  04/07/2021  . INFLUENZA VACCINE  Completed  . COVID-19 Vaccine  Completed    Physical Exam: Vitals:   08/25/20 1439  BP: 140/70  Pulse: (!) 51  Temp: (!) 96.9 F (36.1 C)  SpO2: 100%  Weight: 121 lb 9.6 oz (55.2 kg)  Height: 5\' 1"  (1.549 m)   Body mass index is 22.98 kg/m. Physical Exam  Constitutional: Oriented to person, place, and time. Well-developed and well-nourished.  HENT:  Head: Normocephalic.  Mouth/Throat: Oropharynx is clear and moist.  Eyes: Pupils are  equal, round, and reactive to light.  Neck: Neck supple.  Cardiovascular: Normal rate and normal heart sounds.  No murmur heard. Pulmonary/Chest: Effort normal and breath sounds normal. No respiratory distress. No wheezes. She has no rales.  Abdominal: Soft. Bowel sounds are normal. No distension. There is no tenderness. There is no rebound.  Musculoskeletal: No edema.  Lymphadenopathy: none Neurological: Alert and oriented to person, place, and time.  Skin: Skin is warm and dry.  Psychiatric: Normal mood and affect. Behavior is normal. Thought content normal.    Labs reviewed: Basic Metabolic Panel: Recent Labs    01/21/20 1053 07/22/20 0745  NA 139 136  K 4.1 4.1  CL 102 100  CO2 28 27  GLUCOSE 80 82  BUN 21 16  CREATININE 1.00* 0.93*  CALCIUM 9.9 9.6  TSH 1.37  --    Liver Function Tests: Recent Labs    01/21/20 1053 07/22/20 0745  AST 28 28  ALT 14 13  BILITOT 0.5 0.6  PROT 6.8 6.5   No results for input(s): LIPASE, AMYLASE in the last 8760 hours. No results for input(s): AMMONIA in the last 8760 hours. CBC: Recent Labs    01/21/20 1053 07/22/20 0745  WBC 6.5 6.9  NEUTROABS 2,867 3,077  HGB 14.7 14.4  HCT 44.6 43.5  MCV 93.3 93.8  PLT 181 196   Lipid Panel: Recent Labs    01/21/20 1053 07/22/20 0745  CHOL 201* 184  HDL 85 90  LDLCALC 99 79  TRIG 78 70  CHOLHDL 2.4 2.0   No results found for: HGBA1C  Procedures since last visit: No results found.  Assessment/Plan  Essential hypertension Blood pressure is in good limits.  Reassured patient.  No change in her hydrochlorothiazide.  Will follow in couple of months  Other issues  2. Coronary artery disease involving native coronary artery of native heart without angina pectoris Follows with Cardiology On Aspirin and Statin LDL less then 100  3. Hyperlipidemia, unspecified hyperlipidemia type On statin and Welchol  4. Osteoporosis, postmenopausal On Prolia Due for DEXA  5.  Chronic paronychia of finger of right hand Much Better on Betamethasone PRN  6. Vitamin D deficiency Level Noirmal  7. Allergic rhinitis, unspecified seasonality, unspecified trigger On Claritin and Flonase  8. Bilateral carotid artery stenosis 0-39% Bilateral Stenosis Per Dr Jenkins Rouge follow up US in 2023  9. Meningioma [facility]) Had MRI done by Neurosurgery No New recommendations  10. Itching in her head Exam Normal She will call her Dermatologist   Labs/tests ordered:  * No order type specified * Next appt:  Visit date not found

## 2020-09-29 ENCOUNTER — Other Ambulatory Visit: Payer: Self-pay

## 2020-09-29 MED ORDER — HYDROCHLOROTHIAZIDE 12.5 MG PO CAPS
12.5000 mg | ORAL_CAPSULE | Freq: Every day | ORAL | 3 refills | Status: DC
Start: 2020-09-29 — End: 2020-10-11

## 2020-09-29 NOTE — Telephone Encounter (Signed)
Patient wants a 90 day year supply on her HCTZ. To Dr. Chales Abrahams

## 2020-10-11 ENCOUNTER — Other Ambulatory Visit: Payer: Self-pay | Admitting: *Deleted

## 2020-10-11 MED ORDER — HYDROCHLOROTHIAZIDE 12.5 MG PO CAPS
12.5000 mg | ORAL_CAPSULE | Freq: Every day | ORAL | 0 refills | Status: DC
Start: 2020-10-11 — End: 2020-10-13

## 2020-10-11 MED ORDER — HYDROCHLOROTHIAZIDE 12.5 MG PO CAPS
12.5000 mg | ORAL_CAPSULE | Freq: Every day | ORAL | 3 refills | Status: DC
Start: 2020-10-11 — End: 2020-10-11

## 2020-10-11 NOTE — Telephone Encounter (Signed)
Patient requested rx to be sent to [facility] and to local pharmacy. Patient is almost out of medication and Mail order has not received a rx yet.

## 2020-10-13 ENCOUNTER — Other Ambulatory Visit: Payer: Self-pay

## 2020-10-13 MED ORDER — HYDROCHLOROTHIAZIDE 12.5 MG PO CAPS
12.5000 mg | ORAL_CAPSULE | Freq: Every day | ORAL | 0 refills | Status: DC
Start: 2020-10-13 — End: 2020-11-03

## 2020-11-03 ENCOUNTER — Other Ambulatory Visit: Payer: Self-pay | Admitting: Internal Medicine

## 2021-01-17 ENCOUNTER — Other Ambulatory Visit: Payer: Self-pay | Admitting: Internal Medicine

## 2021-01-22 ENCOUNTER — Other Ambulatory Visit: Payer: Self-pay | Admitting: Internal Medicine

## 2021-02-02 ENCOUNTER — Encounter: Payer: Medicare Other | Admitting: Internal Medicine

## 2021-02-09 ENCOUNTER — Encounter: Payer: Self-pay | Admitting: Internal Medicine

## 2021-02-09 ENCOUNTER — Other Ambulatory Visit: Payer: Self-pay

## 2021-02-09 ENCOUNTER — Non-Acute Institutional Stay: Payer: Medicare HMO | Admitting: Internal Medicine

## 2021-02-09 VITALS — BP 146/90 | HR 67 | Temp 97.5°F | Ht 61.0 in | Wt 120.5 lb

## 2021-02-09 DIAGNOSIS — L03011 Cellulitis of right finger: Secondary | ICD-10-CM

## 2021-02-09 DIAGNOSIS — M810 Age-related osteoporosis without current pathological fracture: Secondary | ICD-10-CM | POA: Diagnosis not present

## 2021-02-09 DIAGNOSIS — E785 Hyperlipidemia, unspecified: Secondary | ICD-10-CM

## 2021-02-09 DIAGNOSIS — E559 Vitamin D deficiency, unspecified: Secondary | ICD-10-CM

## 2021-02-09 DIAGNOSIS — I10 Essential (primary) hypertension: Secondary | ICD-10-CM | POA: Diagnosis not present

## 2021-02-09 DIAGNOSIS — I2510 Atherosclerotic heart disease of native coronary artery without angina pectoris: Secondary | ICD-10-CM

## 2021-02-09 DIAGNOSIS — J069 Acute upper respiratory infection, unspecified: Secondary | ICD-10-CM | POA: Diagnosis not present

## 2021-02-09 DIAGNOSIS — R413 Other amnesia: Secondary | ICD-10-CM

## 2021-02-09 DIAGNOSIS — J309 Allergic rhinitis, unspecified: Secondary | ICD-10-CM

## 2021-02-09 DIAGNOSIS — D329 Benign neoplasm of meninges, unspecified: Secondary | ICD-10-CM

## 2021-02-11 NOTE — Progress Notes (Signed)
Location:  [place] of Service:  Clinic (12)  Provider:   Code Status:  Goals of Care:  Advanced Directives 01/29/2019  Does Patient Have a Medical Advance Directive? Yes  Type of Paramedic of [place];Living will  Does patient want to make changes to medical advance directive? No - Patient declined  Copy of [place] in Chart? Yes - validated most recent copy scanned in chart (See row information)     Chief Complaint  Patient presents with  . Medical Management of Chronic Issues    Patient returns to the clinic for her 6 mont follow up. She would like to discuss some head congestion she recently had. She would also like to talk about alopecia.    HPI: Patient is a 86 y.o. female seen today for medical management of chronic diseases.    Patient has a history of coronary artery disease, hypertension, hyperlipidemia, thyroid nodule, osteoporosisand allergic rhinitis, corneal dystrophys/p Corneal Transplant,GERD S/p craniotomy for meningioma in 2016  Hypertension Mildily elevated again  Also 2 weeks ago  had of cough Headache runny nose, Low grade Temp. No SOB Cough is better but still there with Clear sputum. Head Congested. No fever any more She did not test herself for Covid. Says her husband was sick too and He tested negative  No Other issues today. Has noted a area in the back where she thinks it can be ? Alopecia Walks well. No Falls. Has noticed some issues with remembering things sometimes Walks with no assist Lives with her husband Past Medical History:  Diagnosis Date  . Allergy    seasonal  . Anemia   . Arthritis   . Benign neoplasm of descending colon 10/22/2014  . CAD (coronary artery disease)    LAD lession   . Cancer ([place])    skin  . Cataract    removed bilaterally  . Diverticulosis 11/00  . Esophagitis, acute   . Fuch's endothelial dystrophy 04/2009   Dr, Shanon Rosser   . Gastritis 11/00  .  GERD (gastroesophageal reflux disease) 11/00  . Hemorrhoids 8/05  . Hyperlipidemia   . Hypertension   . Lung nodule 1/09  . NSVD (normal spontaneous vaginal delivery) 1967   female   . Osteopenia   . Osteoporosis   . Thyroid nodule 8/08    Past Surgical History:  Procedure Laterality Date  . bilateral cataract surgery  06/2007   DR. Groat   . bilateral cataract surgery  10/08   Dr. Katy Fitch   . cardiac cath 40-50%LAD  6/07  . cardiac cath with angioplasty & stent replacement  4/98  . COLONOSCOPY    . CRANIOTOMY Left 05/13/2015   Procedure: Left frontotemporal Craniotomy for Meningioma resection;  Surgeon: Consuella Lose, MD;  Location: [place] [facility];  Service: Neurosurgery;  Laterality: Left;  . CT LUNG SCREENING  6/07  . FACIAL COSMETIC SURGERY  7/96   Dr. Mikle Bosworth    . MENISCUS REPAIR      Allergies  Allergen Reactions  . Sulfonamide Derivatives   . K+ Care [Potassium Chloride] Diarrhea  . Lipitor [Atorvastatin Calcium]     Myalgias    . Sulfa Antibiotics Rash    Fever blisters    Outpatient Encounter Medications as of 02/09/2021  Medication Sig  . aspirin EC 81 MG tablet Take 81 mg by mouth daily.  . Cholecalciferol (VITAMIN D3) 1000 UNITS CAPS Take 2,000 Units by mouth daily.  Marland Kitchen co-enzyme  Q-10 30 MG capsule Take 1 capsule (30 mg total) by mouth daily.  . colesevelam (WELCHOL) 625 MG tablet Take 3 tablets (1,875 mg total) by mouth 2 (two) times daily with a meal.  . denosumab (PROLIA) 60 MG/ML SOSY injection Inject 60 mg into the skin every 6 (six) months. In prefilled syringes.  . fluticasone (FLONASE) 50 MCG/ACT nasal spray USE 1 TO 2 SPRAYS IN EACH  NOSTRIL DAILY AS DIRECTED  FOR ALLERGIC RHINITIS  . hydrochlorothiazide (MICROZIDE) 12.5 MG capsule TAKE 1 CAPSULE BY MOUTH EVERY DAY  . L-Lysine 1000 MG TABS Take 1,000 mg by mouth daily.  Marland Kitchen loratadine (CLARITIN) 10 MG tablet Take 1 tablet (10 mg total) by mouth daily. (Patient taking differently: Take 10 mg by mouth  daily as needed.)  . magnesium oxide (MAG-OX) 400 MG tablet Take 400 mg by mouth daily.  . Multiple Vitamin (MULTI-VITAMIN PO) Take 1 tablet by mouth daily.  Marland Kitchen omega-3 acid ethyl esters (LOVAZA) 1 g capsule TAKE 2 CAPSULES TWICE DAILY  . polyethylene glycol powder (GLYCOLAX/MIRALAX) powder Take 17 g by mouth daily as needed for moderate constipation.  . pravastatin (PRAVACHOL) 80 MG tablet TAKE 1 TABLET DAILY  . triamcinolone cream (KENALOG) 0.1 % Apply topically daily as needed.  . [DISCONTINUED] ammonium lactate (AMLACTIN) 12 % cream Apply topically at bedtime as needed.   Facility-Administered Encounter Medications as of 02/09/2021  Medication  . nystatin cream (MYCOSTATIN)    Review of Systems:  Review of Systems  Review of Systems  Constitutional: Negative for activity change, appetite change, chills, diaphoresis, fatigue and fever.  HENT:positive for  postnasal drip, rhinorrhea, sinus pain and sore throat.   Respiratory: Negative for apnea, cough, chest tightness, shortness of breath and wheezing.   Cardiovascular: Negative for chest pain, palpitations and leg swelling.  Gastrointestinal: Negative for abdominal distention, abdominal pain, constipation, diarrhea, nausea and vomiting.  Genitourinary: Negative for dysuria and frequency.  Musculoskeletal: Negative for arthralgias, joint swelling and myalgias.  Skin: Negative for rash.  Neurological: Negative for dizziness, syncope, weakness, light-headedness and numbness.  Psychiatric/Behavioral: Negative for behavioral problems, confusion and sleep disturbance.     Health Maintenance  Topic Date Due  . PNA vac Low Risk Adult (2 of 2 - PPSV23) 08/21/2014  . COVID-19 Vaccine (3 - Booster for Moderna series) 04/01/2020  . DEXA SCAN  08/14/2020  . TETANUS/TDAP  04/01/2021  . MAMMOGRAM  04/07/2021  . INFLUENZA VACCINE  05/02/2021  . HPV VACCINES  Aged Out    Physical Exam: Vitals:   02/09/21 1608  BP: (!) 146/90  Pulse: 67   Temp: (!) 97.5 F (36.4 C)  SpO2: 98%  Weight: 120 lb 8 oz (54.7 kg)  Height: 5\' 1"  (1.549 m)   Body mass index is 22.77 kg/m. Physical Exam  Constitutional: Oriented to person, place, and time. Well-developed and well-nourished.  HENT:  Head: Normocephalic.  Mouth/Throat: Oropharynx is clear and moist.  No sinus tenderness. Ears Were Normal TM normal Eyes: Pupils are equal, round, and reactive to light.  Neck: Neck supple.  Cardiovascular: Normal rate and normal heart sounds.  No murmur heard. Pulmonary/Chest: Effort normal and breath sounds normal. No respiratory distress. No wheezes. She has no rales.  Abdominal: Soft. Bowel sounds are normal. No distension. There is no tenderness. There is no rebound.  Musculoskeletal: No edema.  Lymphadenopathy: none Neurological: Alert and oriented to person, place, and time. Gait stable Skin: Skin is warm and dry.  Psychiatric: Normal mood and affect. Behavior  is normal. Thought content normal.    Labs reviewed: Basic Metabolic Panel: Recent Labs    07/22/20 0745  NA 136  K 4.1  CL 100  CO2 27  GLUCOSE 82  BUN 16  CREATININE 0.93*  CALCIUM 9.6   Liver Function Tests: Recent Labs    07/22/20 0745  AST 28  ALT 13  BILITOT 0.6  PROT 6.5   No results for input(s): LIPASE, AMYLASE in the last 8760 hours. No results for input(s): AMMONIA in the last 8760 hours. CBC: Recent Labs    07/22/20 0745  WBC 6.9  NEUTROABS 3,077  HGB 14.4  HCT 43.5  MCV 93.8  PLT 196   Lipid Panel: Recent Labs    07/22/20 0745  CHOL 184  HDL 90  LDLCALC 79  TRIG 70  CHOLHDL 2.0   No results found for: HGBA1C  Procedures since last visit: No results found.  Assessment/Plan 1. Essential hypertension BP Mildily High Will Check at home and Bring it next Appointment - CBC with Differential/Platelet; Future - COMPLETE METABOLIC PANEL WITH GFR; Future  2. Osteoporosis, postmenopausal Repeat DEXA scan ordered On Prolia - TSH;  Future  3. Coronary artery disease involving native coronary artery of native heart without angina pectoris - Lipid panel; Future On aspirin Statin   4. Viral upper respiratory tract infection Possible Covid But its been already 2 weeks Told her to Take Mucinex. If Symptoms Worsen or she gets SOB or Fever to go to ED Encourage Rest and Hydration   5. Hyperlipidemia, unspecified hyperlipidemia type On Pravachol and Welchol Repeat Lipid Panel  6. Chronic paronychia of finger of right hand Resolved  7. Vitamin D deficiency On Supplement  8. Allergic rhinitis, unspecified seasonality, unspecified trigger On FLonase  9. Memory deficit .MMSE in 4 /21 was 30/30 Will repeat in Next visit   10. Meningioma [facility]) Follows with Neurosurgery Has had MRI done     Labs/tests ordered:  * No order type specified * Next appt:  02/14/2021

## 2021-02-14 ENCOUNTER — Other Ambulatory Visit: Payer: Self-pay

## 2021-02-14 DIAGNOSIS — I2510 Atherosclerotic heart disease of native coronary artery without angina pectoris: Secondary | ICD-10-CM

## 2021-02-14 DIAGNOSIS — I10 Essential (primary) hypertension: Secondary | ICD-10-CM

## 2021-02-14 DIAGNOSIS — M810 Age-related osteoporosis without current pathological fracture: Secondary | ICD-10-CM

## 2021-02-18 LAB — COMPLETE METABOLIC PANEL WITH GFR
AG Ratio: 1.9 (calc) (ref 1.0–2.5)
ALT: 13 U/L (ref 6–29)
AST: 27 U/L (ref 10–35)
Albumin: 4.3 g/dL (ref 3.6–5.1)
Alkaline phosphatase (APISO): 39 U/L (ref 37–153)
BUN/Creatinine Ratio: 20 (calc) (ref 6–22)
BUN: 20 mg/dL (ref 7–25)
CO2: 30 mmol/L (ref 20–32)
Calcium: 9.9 mg/dL (ref 8.6–10.4)
Chloride: 102 mmol/L (ref 98–110)
Creat: 0.98 mg/dL — ABNORMAL HIGH (ref 0.60–0.88)
GFR, Est African American: 61 mL/min/{1.73_m2} (ref >=60)
GFR, Est Non African American: 53 mL/min/{1.73_m2} — ABNORMAL LOW (ref >=60)
Globulin: 2.3 g/dL (calc) (ref 1.9–3.7)
Glucose, Bld: 73 mg/dL (ref 65–99)
Potassium: 4.1 mmol/L (ref 3.5–5.3)
Sodium: 139 mmol/L (ref 135–146)
Total Bilirubin: 0.5 mg/dL (ref 0.2–1.2)
Total Protein: 6.6 g/dL (ref 6.1–8.1)

## 2021-02-18 LAB — CBC WITH DIFFERENTIAL/PLATELET
Absolute Monocytes: 758 cells/uL (ref 200–950)
Basophils Absolute: 60 cells/uL (ref 0–200)
Basophils Relative: 0.8 %
Eosinophils Absolute: 210 cells/uL (ref 15–500)
Eosinophils Relative: 2.8 %
HCT: 43.1 % (ref 35.0–45.0)
Hemoglobin: 14.3 g/dL (ref 11.7–15.5)
Lymphs Abs: 3060 cells/uL (ref 850–3900)
MCH: 31.2 pg (ref 27.0–33.0)
MCHC: 33.2 g/dL (ref 32.0–36.0)
MCV: 93.9 fL (ref 80.0–100.0)
MPV: 9.1 fL (ref 7.5–12.5)
Monocytes Relative: 10.1 %
Neutro Abs: 3413 cells/uL (ref 1500–7800)
Neutrophils Relative %: 45.5 %
Platelets: 221 10*3/uL (ref 140–400)
RBC: 4.59 10*6/uL (ref 3.80–5.10)
RDW: 12.1 % (ref 11.0–15.0)
Total Lymphocyte: 40.8 %
WBC: 7.5 10*3/uL (ref 3.8–10.8)

## 2021-02-18 LAB — LIPID PANEL
Cholesterol: 207 mg/dL — ABNORMAL HIGH (ref <200)
HDL: 84 mg/dL (ref >=50)
LDL Cholesterol (Calc): 106 mg/dL (calc) — ABNORMAL HIGH
Non-HDL Cholesterol (Calc): 123 mg/dL (calc) (ref <130)
Total CHOL/HDL Ratio: 2.5 (calc) (ref <5.0)
Triglycerides: 76 mg/dL (ref <150)

## 2021-02-18 LAB — TSH: TSH: 1.57 mIU/L (ref 0.40–4.50)

## 2021-02-24 ENCOUNTER — Ambulatory Visit (INDEPENDENT_AMBULATORY_CARE_PROVIDER_SITE_OTHER): Payer: Medicare HMO | Admitting: Orthopedic Surgery

## 2021-02-24 ENCOUNTER — Encounter: Payer: Self-pay | Admitting: Orthopedic Surgery

## 2021-02-24 ENCOUNTER — Other Ambulatory Visit: Payer: Self-pay

## 2021-02-24 ENCOUNTER — Ambulatory Visit: Payer: Medicare Other

## 2021-02-24 VITALS — BP 120/70 | HR 88 | Temp 97.2°F | Ht 61.0 in | Wt 119.8 lb

## 2021-02-24 DIAGNOSIS — R300 Dysuria: Secondary | ICD-10-CM | POA: Diagnosis not present

## 2021-02-24 DIAGNOSIS — M810 Age-related osteoporosis without current pathological fracture: Secondary | ICD-10-CM | POA: Diagnosis not present

## 2021-02-24 DIAGNOSIS — N898 Other specified noninflammatory disorders of vagina: Secondary | ICD-10-CM

## 2021-02-24 LAB — POCT URINALYSIS DIPSTICK
Bilirubin, UA: NEGATIVE
Glucose, UA: NEGATIVE
Ketones, UA: NEGATIVE
Nitrite, UA: NEGATIVE
Protein, UA: POSITIVE — AB
Spec Grav, UA: 1.01 (ref 1.010–1.025)
Urobilinogen, UA: 0.2 E.U./dL
pH, UA: 7.5 (ref 5.0–8.0)

## 2021-02-24 MED ORDER — PHENAZOPYRIDINE HCL 100 MG PO TABS: 100.0000 mg | ORAL_TABLET | Freq: Three times a day (TID) | ORAL | 0 refills | Status: DC | PRN

## 2021-02-24 MED ORDER — DENOSUMAB 60 MG/ML ~~LOC~~ SOSY
60.0000 mg | PREFILLED_SYRINGE | Freq: Once | SUBCUTANEOUS | Status: AC
  Administered 2021-02-24: 60 mg via SUBCUTANEOUS

## 2021-02-24 MED ORDER — ESTRADIOL 0.1 MG/GM VA CREA: 1.0000 | TOPICAL_CREAM | Freq: Every day | VAGINAL | 12 refills | Status: DC

## 2021-02-24 NOTE — Progress Notes (Signed)
Careteam: Patient Care Team: Virgie Dad, MD as PCP - General (Internal Medicine) Minus Breeding, MD as PCP - Cardiology (Cardiology) Minus Breeding, MD (Cardiology) Clent Jacks, MD (Ophthalmology) Inda Castle, MD (Inactive) as Consulting Physician (Gastroenterology)  Seen by: Windell Moulding, AGNP-C  PLACE OF SERVICE:  [place]  Advanced Directive information    Allergies  Allergen Reactions  . Sulfonamide Derivatives   . K+ Care [Potassium Chloride] Diarrhea  . Lipitor [Atorvastatin Calcium]     Myalgias    . Sulfa Antibiotics Rash    Fever blisters    Chief Complaint  Patient presents with  . Acute Visit    Patient thinks she has UTI and needs Prolia injection. She states that her urine was cloudy. She has been having urinary frequency. Some burning on urination. Patient has been feeling yucky for a while. Patient had fever of 101.9 on yesterday.     HPI: Patient is a 86 y.o. [name] seen today for acute visit for dysuria and urinary frequency.   Symptoms began 05/24. Last urinary tract infection over a year ago. Reports fever last night of 100.0, and some low back pain. No fever today. She is unsure if urinary symptoms are related to vaginal dryness. She used old Estrace cream and symptoms improved. Admits to not drinking enough water during the day.   She had a sinus infection last month, resolved at this time.  Review of Systems:  Review of Systems  Constitutional: Positive for fever. Negative for chills and malaise/fatigue.  HENT: Negative.   Respiratory: Negative for cough, shortness of breath and wheezing.   Cardiovascular: Negative for chest pain and leg swelling.  Genitourinary: Positive for dysuria, flank pain and frequency. Negative for hematuria.  Psychiatric/Behavioral: Negative for depression. The patient is not nervous/anxious.     Past Medical History:  Diagnosis Date  . Allergy    seasonal  . Anemia   . Arthritis   . Benign  neoplasm of descending colon 10/22/2014  . CAD (coronary artery disease)    LAD lession   . Cancer ([place])    skin  . Cataract    removed bilaterally  . Diverticulosis 11/00  . Esophagitis, acute   . Fuch's endothelial dystrophy 04/2009   Dr, Shanon Rosser   . Gastritis 11/00  . GERD (gastroesophageal reflux disease) 11/00  . Hemorrhoids 8/05  . Hyperlipidemia   . Hypertension   . Lung nodule 1/09  . NSVD (normal spontaneous vaginal delivery) 1967   [name]   . Osteopenia   . Osteoporosis   . Thyroid nodule 8/08   Past Surgical History:  Procedure Laterality Date  . bilateral cataract surgery  06/2007   DR. Groat   . bilateral cataract surgery  10/08   Dr. Katy Fitch   . cardiac cath 40-50%LAD  6/07  . cardiac cath with angioplasty & stent replacement  4/98  . COLONOSCOPY    . CRANIOTOMY Left 05/13/2015   Procedure: Left frontotemporal Craniotomy for Meningioma resection;  Surgeon: Consuella Lose, MD;  Location: [place] [facility];  Service: Neurosurgery;  Laterality: Left;  . CT LUNG SCREENING  6/07  . FACIAL COSMETIC SURGERY  7/96   Dr. Mikle Bosworth    . MENISCUS REPAIR     Social History:   reports that she has quit smoking. Her smoking use included cigarettes. She has a 13.50 pack-year smoking history. She has never used smokeless tobacco. She reports that she does not drink alcohol and does not use drugs.  Family History  Problem Relation Age of Onset  . Breast cancer Sister 71       lumpectomy  . Heart disease Sister   . Stroke Sister   . GER disease Sister   . Dementia Sister   . Hepatitis Brother   . Heart attack Brother   . Stroke Brother   . Diabetes Mother   . Hypertension Mother   . Diabetes Father   . Hypertension Father   . Alzheimer's disease Sister   . Colon cancer Neg Hx     Medications: Patient's Medications  New Prescriptions   No medications on file  Previous Medications   ASPIRIN EC 81 MG TABLET    Take 81 mg by mouth daily.   CHOLECALCIFEROL (VITAMIN D3)  1000 UNITS CAPS    Take 2,000 Units by mouth daily.   CO-ENZYME Q-10 30 MG CAPSULE    Take 1 capsule (30 mg total) by mouth daily.   COLESEVELAM (WELCHOL) 625 MG TABLET    Take 3 tablets (1,875 mg total) by mouth 2 (two) times daily with a meal.   DENOSUMAB (PROLIA) Yolanda MG/ML SOSY INJECTION    Inject Yolanda mg into the skin every 6 (six) months. In prefilled syringes.   FLUTICASONE (FLONASE) 50 MCG/ACT NASAL SPRAY    USE 1 TO 2 SPRAYS IN EACH  NOSTRIL DAILY AS DIRECTED  FOR ALLERGIC RHINITIS   HYDROCHLOROTHIAZIDE (MICROZIDE) 12.5 MG CAPSULE    TAKE 1 CAPSULE BY MOUTH EVERY DAY   L-LYSINE 1000 MG TABS    Take 1,000 mg by mouth daily.   LORATADINE (CLARITIN) 10 MG TABLET    Take 1 tablet (10 mg total) by mouth daily.   MAGNESIUM OXIDE (MAG-OX) 400 MG TABLET    Take 400 mg by mouth daily.   MULTIPLE VITAMIN (MULTI-VITAMIN PO)    Take 1 tablet by mouth daily.   OMEGA-3 ACID ETHYL ESTERS (LOVAZA) 1 G CAPSULE    TAKE 2 CAPSULES TWICE DAILY   POLYETHYLENE GLYCOL POWDER (GLYCOLAX/MIRALAX) POWDER    Take 17 g by mouth daily as needed for moderate constipation.   PRAVASTATIN (PRAVACHOL) 80 MG TABLET    TAKE 1 TABLET DAILY   TRIAMCINOLONE CREAM (KENALOG) 0.1 %    Apply topically daily as needed.  Modified Medications   No medications on file  Discontinued Medications   No medications on file    Physical Exam:  Vitals:   02/24/21 1043  BP: 120/70  Pulse: 88  Temp: (!) 97.2 F (36.2 C)  SpO2: 90%  Weight: 119 lb 12.8 oz (54.3 kg)  Height: 5\' 1"  (1.549 m)   Body mass index is 22.64 kg/m. Wt Readings from Last 3 Encounters:  02/24/21 119 lb 12.8 oz (54.3 kg)  02/09/21 120 lb 8 oz (54.7 kg)  08/25/20 121 lb 9.6 oz (55.2 kg)    Physical Exam Vitals reviewed.  Constitutional:      General: She is not in acute distress. HENT:     Head: Normocephalic.  Cardiovascular:     Rate and Rhythm: Normal rate and regular rhythm.     Pulses: Normal pulses.     Heart sounds: Normal heart sounds. No  murmur heard.   Pulmonary:     Effort: Pulmonary effort is normal. No respiratory distress.     Breath sounds: Normal breath sounds. No wheezing.  Abdominal:     General: Bowel sounds are normal. There is no distension.     Palpations: Abdomen is soft.  Tenderness: There is no abdominal tenderness.  Musculoskeletal:     Comments: No CVA tenderness  Skin:    General: Skin is warm and dry.     Capillary Refill: Capillary refill takes less than 2 seconds.  Neurological:     General: No focal deficit present.     Mental Status: She is alert and oriented to person, place, and time.  Psychiatric:        Mood and Affect: Mood normal.        Behavior: Behavior normal.     Labs reviewed: Basic Metabolic Panel: Recent Labs    07/22/20 0745 02/17/21 0750  NA 136 139  K 4.1 4.1  CL 100 102  CO2 27 30  GLUCOSE 82 73  BUN 16 20  CREATININE 0.93* 0.98*  CALCIUM 9.6 9.9  TSH  --  1.57   Liver Function Tests: Recent Labs    07/22/20 0745 02/17/21 0750  AST 28 27  ALT 13 13  BILITOT 0.6 0.5  PROT 6.5 6.6   No results for input(s): LIPASE, AMYLASE in the last 8760 hours. No results for input(s): AMMONIA in the last 8760 hours. CBC: Recent Labs    07/22/20 0745 02/17/21 0750  WBC 6.9 7.5  NEUTROABS 3,077 3,413  HGB 14.4 14.3  HCT 43.5 43.1  MCV 93.8 93.9  PLT 196 221   Lipid Panel: Recent Labs    07/22/20 0745 02/17/21 0750  CHOL 184 207*  HDL 90 84  LDLCALC 79 106*  TRIG 70 76  CHOLHDL 2.0 2.5   TSH: Recent Labs    02/17/21 0750  TSH 1.57   A1C: No results found for: HGBA1C   Assessment/Plan 1. Osteoporosis, postmenopausal - denosumab (PROLIA) injection Yolanda mg - cont vitamin 2000 units dailt  2. Dysuria - began 2 days ago - no fever today or CVA tenderness - POC Urinalysis Dipstick - Culture, Urine - phenazopyridine (PYRIDIUM) 100 MG tablet; Take 1 tablet (100 mg total) by mouth 3 (three) times daily as needed for pain.  Dispense: 10  tablet; Refill: 0 - encourage hydration with water   3. Vaginal dryness - reports symptoms improved after using some old estrace - estradiol (ESTRACE VAGINAL) 0.1 MG/GM vaginal cream; Place 1 Applicatorful vaginally at bedtime.  Dispense: 42.5 g; Refill: 12  Total time: 21 minutes. Greater than 50% of total time spent doing patient education and coordination of care regarding UTI symptoms and daily hydration with water.     Next appt: Visit date not found [place] [phone]

## 2021-02-24 NOTE — Patient Instructions (Signed)
Hydrate with water  Dysuria Dysuria is pain or discomfort during urination. The pain or discomfort may be felt in the part of the body that drains urine from the bladder (urethra) or in the surrounding tissue of the genitals. The pain may also be felt in the groin area, lower abdomen, or lower back. You may have to urinate frequently or have the sudden feeling that you have to urinate (urgency). Dysuria can affect anyone, but it is more common in females. Dysuria can be caused by many different things, including:  Urinary tract infection.  Kidney stones or bladder stones.  Certain STIs (sexually transmitted infections), such as chlamydia.  Dehydration.  Inflammation of the tissues of the vagina.  Use of certain medicines.  Use of certain soaps or scented products that cause irritation. Follow these instructions at home: Medicines  Take over-the-counter and prescription medicines only as told by your health care provider.  If you were prescribed an antibiotic medicine, take it as told by your health care provider. Do not stop taking the antibiotic even if you start to feel better. Eating and drinking  Drink enough fluid to keep your urine pale yellow.  Avoid caffeinated beverages, tea, and alcohol. These beverages can irritate the bladder and make dysuria worse. In males, alcohol may irritate the prostate.   General instructions  Watch your condition for any changes.  Urinate often. Avoid holding urine for long periods of time.  If you are female, you should wipe from front to back after urinating or having a bowel movement. Use each piece of toilet paper only once.  Empty your bladder after sex.  Keep all follow-up visits. This is important.  If you had any tests done to find the cause of dysuria, it is up to you to get your test results. Ask your health care provider, or the department that is doing the test, when your results will be ready. Contact a health care provider  if:  You have a fever.  You develop pain in your back or sides.  You have nausea or vomiting.  You have blood in your urine.  You are not urinating as often as you usually do. Get help right away if:  Your pain is severe and not relieved with medicines.  You cannot eat or drink without vomiting.  You are confused.  You have a rapid heartbeat while resting.  You have shaking or chills.  You feel extremely weak. Summary  Dysuria is pain or discomfort while urinating. Many different conditions can lead to dysuria.  If you have dysuria, you may have to urinate frequently or have the sudden feeling that you have to urinate (urgency).  Watch your condition for any changes. Keep all follow-up visits.  Make sure that you urinate often and drink enough fluid to keep your urine pale yellow. This information is not intended to replace advice given to you by your health care provider. Make sure you discuss any questions you have with your health care provider. Document Revised: 04/30/2020 Document Reviewed: 04/30/2020 Elsevier Patient Education  [place].

## 2021-02-26 LAB — URINE CULTURE
MICRO NUMBER:: 11943332
SPECIMEN QUALITY:: ADEQUATE

## 2021-03-01 ENCOUNTER — Telehealth: Payer: Self-pay

## 2021-03-01 ENCOUNTER — Other Ambulatory Visit: Payer: Self-pay | Admitting: Nurse Practitioner

## 2021-03-01 MED ORDER — AMOXICILLIN-POT CLAVULANATE 875-125 MG PO TABS: 1.0000 | ORAL_TABLET | Freq: Two times a day (BID) | ORAL | 0 refills | Status: DC

## 2021-03-01 NOTE — Telephone Encounter (Signed)
-----   Message from Lauree Chandler, NP sent at 03/01/2021  8:38 AM EDT ----- UTI noted, recommend to start augmentin 1 tablet twice daily for 1 week. Take with full glass of water and to use probiotic twice daily while on antibiotic. [facility] on [address]

## 2021-03-02 ENCOUNTER — Telehealth: Payer: Self-pay

## 2021-03-02 DIAGNOSIS — N898 Other specified noninflammatory disorders of vagina: Secondary | ICD-10-CM

## 2021-03-02 NOTE — Telephone Encounter (Signed)
[facility] pharmacy called needing clrification on estradiol cream for patient. Prescribed on 02/24/21. Please advise. Thank you.

## 2021-03-03 MED ORDER — ESTRADIOL 0.1 MG/GM VA CREA: 1.0000 | TOPICAL_CREAM | VAGINAL | 12 refills | Status: AC

## 2021-03-03 NOTE — Telephone Encounter (Signed)
Resent to pharmacy with instruction of three times weekly.  Thank you.

## 2021-03-07 ENCOUNTER — Telehealth: Payer: Self-pay | Admitting: Internal Medicine

## 2021-03-07 ENCOUNTER — Other Ambulatory Visit: Payer: Self-pay | Admitting: Nurse Practitioner

## 2021-03-07 DIAGNOSIS — M810 Age-related osteoporosis without current pathological fracture: Secondary | ICD-10-CM

## 2021-03-07 NOTE — Telephone Encounter (Signed)
Pt called stating she needed a referal to the breast center for bone(dexa scan). Dr Lyndel Safe sent over referral for mammogram but not for dexa  Please advise Yolanda [name]

## 2021-03-28 ENCOUNTER — Other Ambulatory Visit: Payer: Self-pay | Admitting: Internal Medicine

## 2021-03-28 DIAGNOSIS — Z1231 Encounter for screening mammogram for malignant neoplasm of breast: Secondary | ICD-10-CM

## 2021-05-19 DIAGNOSIS — H9113 Presbycusis, bilateral: Secondary | ICD-10-CM | POA: Insufficient documentation

## 2021-05-19 DIAGNOSIS — T162XXA Foreign body in left ear, initial encounter: Secondary | ICD-10-CM | POA: Insufficient documentation

## 2021-06-14 NOTE — Progress Notes (Signed)
Cardiology Office Note   Date:  06/15/2021   ID:  Yolanda [name], DOB 1935/09/19, MRN [id number]  PCP:  Virgie Dad, MD  Cardiologist:   Minus Breeding, MD   Chief Complaint  Patient presents with   Coronary Artery Disease       History of Present Illness: Yolanda [name] is a 86 y.o. female who presents for follow up of CAD.  Since I last saw her she has done well.  She denies any cardiovascular symptoms such as resting chest discomfort, neck or arm discomfort.  She has no palpitations, presyncope or syncope.  She might have a little discomfort walking up a hill and she does some significant hills where she lives at [facility].  She thinks that pattern is unchanged.  She is not having any complaints about this and feels well otherwise.  She did have a urinary infection and sinus infection recently.   Past Medical History:  Diagnosis Date   Allergy    seasonal   Anemia    Arthritis    Benign neoplasm of descending colon 10/22/2014   CAD (coronary artery disease)    LAD lession    Cancer ([place])    skin   Cataract    removed bilaterally   Diverticulosis 11/00   Esophagitis, acute    Fuch's endothelial dystrophy 04/2009   Dr, Shanon Rosser    Gastritis 11/00   GERD (gastroesophageal reflux disease) 11/00   Hemorrhoids 8/05   Hyperlipidemia    Hypertension    Lung nodule 1/09   NSVD (normal spontaneous vaginal delivery) 1967   female    Osteopenia    Osteoporosis    Thyroid nodule 8/08    Past Surgical History:  Procedure Laterality Date   bilateral cataract surgery  06/2007   DR. Groat    bilateral cataract surgery  10/08   Dr. Katy Fitch    cardiac cath 40-50%LAD  6/07   cardiac cath with angioplasty & stent replacement  4/98   COLONOSCOPY     CRANIOTOMY Left 05/13/2015   Procedure: Left frontotemporal Craniotomy for Meningioma resection;  Surgeon: Consuella Lose, MD;  Location: [place] [facility];  Service: Neurosurgery;  Laterality: Left;   CT LUNG  SCREENING  6/07   FACIAL COSMETIC SURGERY  7/96   Dr. Mikle Bosworth     MENISCUS REPAIR       Current Outpatient Medications  Medication Sig Dispense Refill   aspirin EC 81 MG tablet Take 81 mg by mouth daily.     Cholecalciferol (VITAMIN D3) 1000 UNITS CAPS Take 2,000 Units by mouth daily.     co-enzyme Q-10 30 MG capsule Take 1 capsule (30 mg total) by mouth daily. 90 capsule 1   colesevelam (WELCHOL) 625 MG tablet Take 3 tablets (1,875 mg total) by mouth 2 (two) times daily with a meal. 540 tablet 3   denosumab (PROLIA) 60 MG/ML SOSY injection Inject 60 mg into the skin every 6 (six) months. In prefilled syringes. 1 mL 0   estradiol (ESTRACE VAGINAL) 0.1 MG/GM vaginal cream Place 1 Applicatorful vaginally 3 (three) times a week. 42.5 g 12   fluticasone (FLONASE) 50 MCG/ACT nasal spray USE 1 TO 2 SPRAYS IN EACH  NOSTRIL DAILY AS DIRECTED  FOR ALLERGIC RHINITIS 48 g 1   hydrochlorothiazide (MICROZIDE) 12.5 MG capsule TAKE 1 CAPSULE BY MOUTH EVERY DAY 90 capsule 1   L-Lysine 1000 MG TABS Take 1,000 mg by mouth daily.     loratadine (CLARITIN)  10 MG tablet Take 1 tablet (10 mg total) by mouth daily. (Patient taking differently: Take 10 mg by mouth daily as needed.) 30 tablet 11   Multiple Vitamin (MULTI-VITAMIN PO) Take 1 tablet by mouth daily.     omega-3 acid ethyl esters (LOVAZA) 1 g capsule TAKE 2 CAPSULES TWICE DAILY 360 capsule 1   polyethylene glycol powder (GLYCOLAX/MIRALAX) powder Take 17 g by mouth daily as needed for moderate constipation. 3350 g 1   pravastatin (PRAVACHOL) 80 MG tablet TAKE 1 TABLET DAILY 90 tablet 1   Probiotic Product (PROBIOTIC PO) Take 1 capsule by mouth 2 (two) times daily. While on antibiotic1     Current Facility-Administered Medications  Medication Dose Route Frequency Provider Last Rate Last Admin   nystatin cream (MYCOSTATIN)   Topical BID Virgie Dad, MD        Allergies:   Sulfonamide derivatives, K+ care [potassium chloride], Lipitor [atorvastatin  calcium], and Sulfa antibiotics    ROS:  Please see the history of present illness.   Otherwise, review of systems are positive for none.   All other systems are reviewed and negative.    PHYSICAL EXAM: VS:  BP 132/78   Pulse 67   Ht '5\' 1"'$  (1.549 m)   Wt 121 lb (54.9 kg)   SpO2 94%   BMI 22.86 kg/m  , BMI Body mass index is 22.86 kg/m. GENERAL:  Well appearing NECK:  No jugular venous distention, waveform within normal limits, carotid upstroke brisk and symmetric, no bruits, no thyromegaly LUNGS:  Clear to auscultation bilaterally CHEST:  Unremarkable HEART:  PMI not displaced or sustained,S1 and S2 within normal limits, no S3, no S4, no clicks, no rubs, no murmurs ABD:  Flat, positive bowel sounds normal in frequency in pitch, no bruits, no rebound, no guarding, no midline pulsatile mass, no hepatomegaly, no splenomegaly EXT:  2 plus pulses throughout, no edema, no cyanosis no clubbing   EKG:  EKG is  ordered today. The ekg ordered today demonstrates sinus rhythm, rate 67, axis within normal limits, low voltage in the limb leads, poor anterior R wave progression.  Nonspecific inferior and lateral ST changes.   Recent Labs: 02/17/2021: ALT 13; BUN 20; Creat 0.98; Hemoglobin 14.3; Platelets 221; Potassium 4.1; Sodium 139; TSH 1.57    Lipid Panel    Component Value Date/Time   CHOL 207 (H) 02/17/2021 0750   CHOL 178 05/02/2017 0944   CHOL 192 04/02/2013 0930   TRIG 76 02/17/2021 0750   TRIG 57 12/20/2016 1058   TRIG 78 04/02/2013 0930   HDL 84 02/17/2021 0750   HDL 94 05/02/2017 0944   HDL 89 12/20/2016 1058   HDL 82 04/02/2013 0930   CHOLHDL 2.5 02/17/2021 0750   LDLCALC 106 (H) 02/17/2021 0750   LDLCALC 89 04/14/2014 1011   LDLCALC 94 04/02/2013 0930      Wt Readings from Last 3 Encounters:  06/15/21 121 lb (54.9 kg)  02/24/21 119 lb 12.8 oz (54.3 kg)  02/09/21 120 lb 8 oz (54.7 kg)      Other studies Reviewed: Additional studies/ records that were reviewed  today include: Labs. Review of the above records demonstrates:  Please see elsewhere in the note.     ASSESSMENT AND PLAN:  CAD, UNSPECIFIED SITE -  She is having some mild exertional discomfort but she absolutely says that this is unchanged from previous.  She wants conservative evaluation.  She has had several EKG changes given the absence of resting  symptoms and her very high exertional and functional level no further testing is suggested at this point but she would let me know if she has any increasing symptoms.  She will continue with secondary risk reduction.   CAROTID STENOSIS -  She had 0-39% bilateral stenosis.  I will repeat this study in 2023.   HYPERTENSION, UNSPECIFIED -  The blood pressure is well controlled.  No change in therapy.   HYPERLIPIDEMIA-MIXED -  Her LDL is mildly elevated at 106.  However, her HDL is excellent.  No change in therapy.    Current medicines are reviewed at length with the patient today.  The patient does not have concerns regarding medicines.  The following changes have been made:  None  Labs/ tests ordered today include: None  Orders Placed This Encounter  Procedures   EKG 12-Lead      Disposition:   FU with me in 6 months   Signed, Minus Breeding, MD  06/15/2021 9:44 AM    [place]

## 2021-06-15 ENCOUNTER — Encounter: Payer: Self-pay | Admitting: Cardiology

## 2021-06-15 ENCOUNTER — Ambulatory Visit: Payer: Medicare HMO | Admitting: Cardiology

## 2021-06-15 ENCOUNTER — Other Ambulatory Visit: Payer: Self-pay

## 2021-06-15 VITALS — BP 132/78 | HR 67 | Ht 61.0 in | Wt 121.0 lb

## 2021-06-15 DIAGNOSIS — I10 Essential (primary) hypertension: Secondary | ICD-10-CM

## 2021-06-15 DIAGNOSIS — I2510 Atherosclerotic heart disease of native coronary artery without angina pectoris: Secondary | ICD-10-CM

## 2021-06-15 DIAGNOSIS — I6523 Occlusion and stenosis of bilateral carotid arteries: Secondary | ICD-10-CM | POA: Diagnosis not present

## 2021-06-15 DIAGNOSIS — E785 Hyperlipidemia, unspecified: Secondary | ICD-10-CM | POA: Diagnosis not present

## 2021-06-15 NOTE — Patient Instructions (Signed)
Medication Instructions:  The current medical regimen is effective;  continue present plan and medications.  *If you need a refill on your cardiac medications before your next appointment, please call your pharmacy*   Follow-Up: At [facility], you and your health needs are our priority.  As part of our continuing mission to provide you with exceptional heart care, we have created designated Provider Care Teams.  These Care Teams include your primary Cardiologist (physician) and Advanced Practice Providers (APPs -  Physician Assistants and Nurse Practitioners) who all work together to provide you with the care you need, when you need it.  We recommend signing up for the patient portal called "MyChart".  Sign up information is provided on this After Visit Summary.  MyChart is used to connect with patients for Virtual Visits (Telemedicine).  Patients are able to view lab/test results, encounter notes, upcoming appointments, etc.  Non-urgent messages can be sent to your provider as well.   To learn more about what you can do with MyChart, go to [email or web address].    Your next appointment:   6 month(s)  The format for your next appointment:   In Person  Provider:   Minus Breeding, MD

## 2021-08-10 ENCOUNTER — Other Ambulatory Visit: Payer: Self-pay

## 2021-08-10 ENCOUNTER — Non-Acute Institutional Stay: Payer: Medicare HMO | Admitting: Internal Medicine

## 2021-08-10 ENCOUNTER — Encounter: Payer: Self-pay | Admitting: Internal Medicine

## 2021-08-10 VITALS — BP 142/74 | HR 85 | Temp 97.3°F | Ht 61.0 in | Wt 118.7 lb

## 2021-08-10 DIAGNOSIS — I10 Essential (primary) hypertension: Secondary | ICD-10-CM

## 2021-08-10 DIAGNOSIS — E785 Hyperlipidemia, unspecified: Secondary | ICD-10-CM

## 2021-08-10 DIAGNOSIS — I2510 Atherosclerotic heart disease of native coronary artery without angina pectoris: Secondary | ICD-10-CM

## 2021-08-10 DIAGNOSIS — D329 Benign neoplasm of meninges, unspecified: Secondary | ICD-10-CM

## 2021-08-10 DIAGNOSIS — M810 Age-related osteoporosis without current pathological fracture: Secondary | ICD-10-CM | POA: Diagnosis not present

## 2021-08-10 DIAGNOSIS — J309 Allergic rhinitis, unspecified: Secondary | ICD-10-CM

## 2021-08-10 DIAGNOSIS — I6523 Occlusion and stenosis of bilateral carotid arteries: Secondary | ICD-10-CM

## 2021-08-10 DIAGNOSIS — E559 Vitamin D deficiency, unspecified: Secondary | ICD-10-CM

## 2021-08-10 MED ORDER — PRAVASTATIN SODIUM 80 MG PO TABS: 80.0000 mg | ORAL_TABLET | Freq: Every day | ORAL | 1 refills | Status: DC

## 2021-08-10 NOTE — Progress Notes (Signed)
Location:  [place] of Service:  Clinic (12)  Provider:   Code Status:  Goals of Care:  Advanced Directives 01/29/2019  Does Patient Have a Medical Advance Directive? Yes  Type of Paramedic of [place];Living will  Does patient want to make changes to medical advance directive? No - Patient declined  Copy of [place] in Chart? Yes - validated most recent copy scanned in chart (See row information)     Chief Complaint  Patient presents with   Medical Management of Chronic Issues    Patient returns to the clinic for her 6 month follow up.    Quality Metric Gaps    Dexa and mammogram scheduled for Dec.  PCV and TDAP    HPI: Patient is a 86 y.o. female seen today for medical management of chronic diseases.    Patient has a history of coronary artery disease, hypertension, hyperlipidemia, thyroid nodule, osteoporosis and allergic rhinitis, corneal dystrophy s/p Corneal Transplant,GERD S/p craniotomy for meningioma in 2016   She continues to stay stable No New Complains today  No Falls Very Active Lives with her husband She was upset about not able to get appointment with me in June when she was had acute Bronchitis. Had to go to [facility] and got help  Past Medical History:  Diagnosis Date   Allergy    seasonal   Anemia    Arthritis    Benign neoplasm of descending colon 10/22/2014   CAD (coronary artery disease)    LAD lession    Cancer ([place])    skin   Cataract    removed bilaterally   Diverticulosis 11/00   Esophagitis, acute    Fuch's endothelial dystrophy 04/2009   Dr, Shanon Rosser    Gastritis 11/00   GERD (gastroesophageal reflux disease) 11/00   Hemorrhoids 8/05   Hyperlipidemia    Hypertension    Lung nodule 1/09   NSVD (normal spontaneous vaginal delivery) 1967   female    Osteopenia    Osteoporosis    Thyroid nodule 8/08    Past Surgical History:  Procedure Laterality Date   bilateral  cataract surgery  06/2007   DR. Groat    bilateral cataract surgery  10/08   Dr. Katy Fitch    cardiac cath 40-50%LAD  6/07   cardiac cath with angioplasty & stent replacement  4/98   COLONOSCOPY     CRANIOTOMY Left 05/13/2015   Procedure: Left frontotemporal Craniotomy for Meningioma resection;  Surgeon: Consuella Lose, MD;  Location: [place] [facility];  Service: Neurosurgery;  Laterality: Left;   CT LUNG SCREENING  6/07   FACIAL COSMETIC SURGERY  7/96   Dr. Mikle Bosworth     MENISCUS REPAIR      Allergies  Allergen Reactions   Sulfonamide Derivatives    K+ Care [Potassium Chloride] Diarrhea   Lipitor [Atorvastatin Calcium]     Myalgias     Sulfa Antibiotics Rash    Fever blisters    Outpatient Encounter Medications as of 08/10/2021  Medication Sig   aspirin EC 81 MG tablet Take 81 mg by mouth daily.   Cholecalciferol (VITAMIN D3) 1000 UNITS CAPS Take 2,000 Units by mouth daily.   co-enzyme Q-10 30 MG capsule Take 1 capsule (30 mg total) by mouth daily.   colesevelam (WELCHOL) 625 MG tablet Take 3 tablets (1,875 mg total) by mouth 2 (two) times daily with a meal.   denosumab (PROLIA) 60 MG/ML SOSY injection  Inject 60 mg into the skin every 6 (six) months. In prefilled syringes.   estradiol (ESTRACE VAGINAL) 0.1 MG/GM vaginal cream Place 1 Applicatorful vaginally 3 (three) times a week.   fluticasone (FLONASE) 50 MCG/ACT nasal spray USE 1 TO 2 SPRAYS IN EACH  NOSTRIL DAILY AS DIRECTED  FOR ALLERGIC RHINITIS   hydrochlorothiazide (MICROZIDE) 12.5 MG capsule TAKE 1 CAPSULE BY MOUTH EVERY DAY   L-Lysine 1000 MG TABS Take 1,000 mg by mouth daily.   loratadine (CLARITIN) 10 MG tablet Take 1 tablet (10 mg total) by mouth daily. (Patient taking differently: Take 10 mg by mouth daily as needed.)   Multiple Vitamin (MULTI-VITAMIN PO) Take 1 tablet by mouth daily.   omega-3 acid ethyl esters (LOVAZA) 1 g capsule TAKE 2 CAPSULES TWICE DAILY   polyethylene glycol powder (GLYCOLAX/MIRALAX) powder Take 17  g by mouth daily as needed for moderate constipation.   Probiotic Product (PROBIOTIC PO) Take 1 capsule by mouth daily. While on antibiotic1   [DISCONTINUED] pravastatin (PRAVACHOL) 80 MG tablet TAKE 1 TABLET DAILY   pravastatin (PRAVACHOL) 80 MG tablet Take 1 tablet (80 mg total) by mouth daily.   Facility-Administered Encounter Medications as of 08/10/2021  Medication   nystatin cream (MYCOSTATIN)    Review of Systems:  Review of Systems Review of Systems  Constitutional: Negative for activity change, appetite change, chills, diaphoresis, fatigue and fever.  HENT: Negative for mouth sores, postnasal drip, rhinorrhea, sinus pain and sore throat.   Respiratory: Negative for apnea, cough, chest tightness, shortness of breath and wheezing.   Cardiovascular: Negative for chest pain, palpitations and leg swelling.  Gastrointestinal: Negative for abdominal distention, abdominal pain, constipation, diarrhea, nausea and vomiting.  Genitourinary: Negative for dysuria and frequency.  Musculoskeletal: Negative for arthralgias, joint swelling and myalgias.  Skin: Negative for rash.  Neurological: Negative for dizziness, syncope, weakness, light-headedness and numbness.  Psychiatric/Behavioral: Negative for behavioral problems, confusion and sleep disturbance.   Health Maintenance  Topic Date Due   Pneumonia Vaccine 54+ Years old (2 - PPSV23 if available, else PCV20) 08/21/2014   DEXA SCAN  08/14/2020   TETANUS/TDAP  04/01/2021   MAMMOGRAM  04/07/2021   COVID-19 Vaccine (6 - Booster) 08/17/2021   INFLUENZA VACCINE  Completed   Zoster Vaccines- Shingrix  Completed   HPV VACCINES  Aged Out    Physical Exam: Vitals:   08/10/21 1400  BP: (!) 142/74  Pulse: 85  Temp: (!) 97.3 F (36.3 C)  SpO2: 96%  Weight: 118 lb 11.2 oz (53.8 kg)  Height: 5\' 1"  (1.549 m)   Body mass index is 22.43 kg/m. Physical Exam Constitutional: Oriented to person, place, and time. Well-developed and  well-nourished.  HENT:  Head: Normocephalic.  Mouth/Throat: Oropharynx is clear and moist.  Eyes: Pupils are equal, round, and reactive to light.  Neck: Neck supple.  Cardiovascular: Normal rate and normal heart sounds.  No murmur heard. Pulmonary/Chest: Effort normal and breath sounds normal. No respiratory distress. No wheezes. She has no rales.  Abdominal: Soft. Bowel sounds are normal. No distension. There is no tenderness. There is no rebound.  Musculoskeletal: No edema.  Lymphadenopathy: none Neurological: Alert and oriented to person, place, and time.  Skin: Skin is warm and dry.  Psychiatric: Normal mood and affect. Behavior is normal. Thought content normal.   Labs reviewed: Basic Metabolic Panel: Recent Labs    02/17/21 0750  NA 139  K 4.1  CL 102  CO2 30  GLUCOSE 73  BUN 20  CREATININE  0.98*  CALCIUM 9.9  TSH 1.57   Liver Function Tests: Recent Labs    02/17/21 0750  AST 27  ALT 13  BILITOT 0.5  PROT 6.6   No results for input(s): LIPASE, AMYLASE in the last 8760 hours. No results for input(s): AMMONIA in the last 8760 hours. CBC: Recent Labs    02/17/21 0750  WBC 7.5  NEUTROABS 3,413  HGB 14.3  HCT 43.1  MCV 93.9  PLT 221   Lipid Panel: Recent Labs    02/17/21 0750  CHOL 207*  HDL 84  LDLCALC 106*  TRIG 76  CHOLHDL 2.5   No results found for: HGBA1C  Procedures since last visit: No results found.  Assessment/Plan 1. Essential hypertension BP runs slightly high On Microzide But no new meds right now  2. Coronary artery disease involving native coronary artery of native heart without angina pectoris on Statin and Aspirin Follows with Cardiology  3. Osteoporosis, postmenopausal Continue Prolia Appointment with DEXA in Dec  4. Hyperlipidemia, unspecified hyperlipidemia type Continue Statin LDL was high will repeat before next visit  5. Vitamin D deficiency On Supplements  6. Allergic rhinitis, unspecified seasonality,  unspecified trigger Continue Flonase Had episode of Bronchitis  in June was seen by [facility] Was treated with Medrol and Doxycyline and Inhaler  7. Meningioma [facility]) Follows with Neurosurgery  8. Bilateral carotid artery stenosis Follows with Cardiology 9. Memory deficit .MMSE in 4 /21 was 30/30    Labs/tests ordered:  * No order type specified * Next appt:  08/29/2021

## 2021-08-29 ENCOUNTER — Encounter: Payer: Self-pay | Admitting: Orthopedic Surgery

## 2021-08-29 ENCOUNTER — Other Ambulatory Visit: Payer: Self-pay

## 2021-08-29 ENCOUNTER — Ambulatory Visit (INDEPENDENT_AMBULATORY_CARE_PROVIDER_SITE_OTHER): Payer: Medicare HMO

## 2021-08-29 ENCOUNTER — Ambulatory Visit (INDEPENDENT_AMBULATORY_CARE_PROVIDER_SITE_OTHER): Payer: Medicare HMO | Admitting: Orthopedic Surgery

## 2021-08-29 VITALS — BP 138/88 | HR 73 | Temp 97.5°F | Resp 16 | Ht 61.0 in | Wt 118.8 lb

## 2021-08-29 DIAGNOSIS — M25512 Pain in left shoulder: Secondary | ICD-10-CM

## 2021-08-29 DIAGNOSIS — M810 Age-related osteoporosis without current pathological fracture: Secondary | ICD-10-CM | POA: Diagnosis not present

## 2021-08-29 DIAGNOSIS — M542 Cervicalgia: Secondary | ICD-10-CM

## 2021-08-29 DIAGNOSIS — J302 Other seasonal allergic rhinitis: Secondary | ICD-10-CM | POA: Diagnosis not present

## 2021-08-29 MED ORDER — DICLOFENAC SODIUM 1 % EX GEL: 2.0000 g | Freq: Four times a day (QID) | CUTANEOUS | Status: DC

## 2021-08-29 MED ORDER — OMEGA-3-ACID ETHYL ESTERS 1 G PO CAPS
2.0000 | ORAL_CAPSULE | Freq: Two times a day (BID) | ORAL | 3 refills | Status: DC
Start: 2021-08-29 — End: 2022-05-09

## 2021-08-29 MED ORDER — DENOSUMAB 60 MG/ML ~~LOC~~ SOSY
60.0000 mg | PREFILLED_SYRINGE | Freq: Once | SUBCUTANEOUS | Status: AC
  Administered 2021-08-29: 10:00:00 60 mg via SUBCUTANEOUS

## 2021-08-29 MED ORDER — FLUTICASONE PROPIONATE 50 MCG/ACT NA SUSP: NASAL | 1 refills | Status: DC

## 2021-08-29 NOTE — Patient Instructions (Addendum)
Try new pillow to sleep on at night- look up " neck pillow"  Continue to stretch daily  May apply voltaren gel four times daily to left neck/shoulder

## 2021-08-29 NOTE — Progress Notes (Signed)
Careteam: Patient Care Team: Virgie Dad, MD as PCP - General (Internal Medicine) Minus Breeding, MD as PCP - Cardiology (Cardiology) Minus Breeding, MD (Cardiology) Clent Jacks, MD (Ophthalmology) Inda Castle, MD (Inactive) as Consulting Physician (Gastroenterology)  Seen by: Windell Moulding, AGNP-C  PLACE OF SERVICE:  [place] Directive information Does Patient Have a Medical Advance Directive?: Yes, Type of Advance Directive: [place];Living will, Does patient want to make changes to medical advance directive?: No - Patient declined  Allergies  Allergen Reactions   Sulfonamide Derivatives    K+ Care [Potassium Chloride] Diarrhea   Lipitor [Atorvastatin Calcium]     Myalgias     Sulfa Antibiotics Rash    Fever blisters    Chief Complaint  Patient presents with   Acute Visit    Patient complains of pain in left shoulder/neck/arm for 1 week.     HPI: Patient is a 86 y.o. female seen today for acute visit due to left neck and shoulder pain.   Symptoms began 11/19. Initially started in left neck when she woke up in the morning. Pain has radiated to left shoulder in the past few days. Denies pain with movement. Describes neck pain as tight, rated 3/10. Left shoulder pain intermittent, described as shooting, rated 3/10. She tried ibuprofen once without success. She had not tried any other interventions. No recent fall or injury to area. Admits to sleeping with same pillow for years.   Review of Systems:  Review of Systems  Constitutional:  Negative for chills, fever, malaise/fatigue and weight loss.  Respiratory:  Negative for cough, shortness of breath and wheezing.   Cardiovascular:  Negative for chest pain and leg swelling.  Musculoskeletal:  Positive for joint pain and neck pain. Negative for falls.       Left shoulder pain  Psychiatric/Behavioral:  Negative for depression. The patient is not nervous/anxious.    Past Medical  History:  Diagnosis Date   Allergy    seasonal   Anemia    Arthritis    Benign neoplasm of descending colon 10/22/2014   CAD (coronary artery disease)    LAD lession    Cancer ([place])    skin   Cataract    removed bilaterally   Diverticulosis 11/00   Esophagitis, acute    Fuch's endothelial dystrophy 04/2009   Dr, Shanon Rosser    Gastritis 11/00   GERD (gastroesophageal reflux disease) 11/00   Hemorrhoids 8/05   Hyperlipidemia    Hypertension    Lung nodule 1/09   NSVD (normal spontaneous vaginal delivery) 1967   female    Osteopenia    Osteoporosis    Thyroid nodule 8/08   Past Surgical History:  Procedure Laterality Date   bilateral cataract surgery  06/2007   DR. Groat    bilateral cataract surgery  10/08   Dr. Katy Fitch    cardiac cath 40-50%LAD  6/07   cardiac cath with angioplasty & stent replacement  4/98   COLONOSCOPY     CRANIOTOMY Left 05/13/2015   Procedure: Left frontotemporal Craniotomy for Meningioma resection;  Surgeon: Consuella Lose, MD;  Location: [facility];  Service: Neurosurgery;  Laterality: Left;   CT LUNG SCREENING  6/07   FACIAL COSMETIC SURGERY  7/96   Dr. Mikle Bosworth     MENISCUS REPAIR     Social History:   reports that she has quit smoking. Her smoking use included cigarettes. She has a 13.50 pack-year smoking history. She has  never used smokeless tobacco. She reports that she does not drink alcohol and does not use drugs.  Family History  Problem Relation Age of Onset   Breast cancer Sister 7       lumpectomy   Heart disease Sister    Stroke Sister    GER disease Sister    Dementia Sister    Hepatitis Brother    Heart attack Brother    Stroke Brother    Diabetes Mother    Hypertension Mother    Diabetes Father    Hypertension Father    Alzheimer's disease Sister    Colon cancer Neg Hx     Medications: Patient's Medications  New Prescriptions   No medications on file  Previous Medications   ASPIRIN EC 81 MG TABLET    Take 81 mg by  mouth daily.   CHOLECALCIFEROL (VITAMIN D3) 1000 UNITS CAPS    Take 2,000 Units by mouth daily.   CO-ENZYME Q-10 30 MG CAPSULE    Take 1 capsule (30 mg total) by mouth daily.   COLESEVELAM (WELCHOL) 625 MG TABLET    Take 3 tablets (1,875 mg total) by mouth 2 (two) times daily with a meal.   DENOSUMAB (PROLIA) 60 MG/ML SOSY INJECTION    Inject 60 mg into the skin every 6 (six) months. In prefilled syringes.   ESTRADIOL (ESTRACE VAGINAL) 0.1 MG/GM VAGINAL CREAM    Place 1 Applicatorful vaginally 3 (three) times a week.   HYDROCHLOROTHIAZIDE (MICROZIDE) 12.5 MG CAPSULE    TAKE 1 CAPSULE BY MOUTH EVERY DAY   L-LYSINE 1000 MG TABS    Take 1,000 mg by mouth daily.   LORATADINE (CLARITIN) 10 MG TABLET    Take 10 mg by mouth as needed for allergies.   MULTIPLE VITAMIN (MULTI-VITAMIN PO)    Take 1 tablet by mouth daily.   OMEGA-3 ACID ETHYL ESTERS (LOVAZA) 1 G CAPSULE    TAKE 2 CAPSULES TWICE DAILY   POLYETHYLENE GLYCOL POWDER (GLYCOLAX/MIRALAX) POWDER    Take 17 g by mouth daily as needed for moderate constipation.   PRAVASTATIN (PRAVACHOL) 80 MG TABLET    Take 1 tablet (80 mg total) by mouth daily.   PROBIOTIC PRODUCT (PROBIOTIC PO)    Take 1 capsule by mouth daily. While on antibiotic1  Modified Medications   Modified Medication Previous Medication   FLUTICASONE (FLONASE) 50 MCG/ACT NASAL SPRAY fluticasone (FLONASE) 50 MCG/ACT nasal spray      USE 1 TO 2 SPRAYS IN EACH  NOSTRIL DAILY AS DIRECTED  FOR ALLERGIC RHINITIS    USE 1 TO 2 SPRAYS IN EACH  NOSTRIL DAILY AS DIRECTED  FOR ALLERGIC RHINITIS  Discontinued Medications   LORATADINE (CLARITIN) 10 MG TABLET    Take 1 tablet (10 mg total) by mouth daily.    Physical Exam:  Vitals:   08/29/21 1018  BP: 138/88  Pulse: 73  Resp: 16  Temp: (!) 97.5 F (36.4 C)  SpO2: 91%  Weight: 118 lb 12.8 oz (53.9 kg)  Height: 5\' 1"  (1.549 m)   Body mass index is 22.45 kg/m. Wt Readings from Last 3 Encounters:  08/29/21 118 lb 12.8 oz (53.9 kg)   08/10/21 118 lb 11.2 oz (53.8 kg)  06/15/21 121 lb (54.9 kg)    Physical Exam Vitals reviewed.  Constitutional:      General: She is not in acute distress. HENT:     Head: Normocephalic.  Eyes:     General:  Right eye: No discharge.        Left eye: No discharge.  Neck:     Vascular: No carotid bruit.     Comments: Tenderness over left trapezius, no sign of deformity or injury. Neck FROM, does not complain of pain.  Cardiovascular:     Rate and Rhythm: Normal rate and regular rhythm.     Pulses: Normal pulses.     Heart sounds: Normal heart sounds. No murmur heard. Pulmonary:     Effort: Pulmonary effort is normal. No respiratory distress.     Breath sounds: Normal breath sounds. No wheezing.  Musculoskeletal:     Left shoulder: Tenderness present. No swelling or crepitus. Normal range of motion. Normal strength.     Cervical back: Normal range of motion. Tenderness present.     Comments: Tenderness over left AC joint, FROM.   Lymphadenopathy:     Cervical: No cervical adenopathy.  Skin:    General: Skin is warm and dry.     Capillary Refill: Capillary refill takes less than 2 seconds.  Neurological:     General: No focal deficit present.     Mental Status: She is alert and oriented to person, place, and time.  Psychiatric:        Mood and Affect: Mood normal.        Behavior: Behavior normal.    Labs reviewed: Basic Metabolic Panel: Recent Labs    02/17/21 0750  NA 139  K 4.1  CL 102  CO2 30  GLUCOSE 73  BUN 20  CREATININE 0.98*  CALCIUM 9.9  TSH 1.57   Liver Function Tests: Recent Labs    02/17/21 0750  AST 27  ALT 13  BILITOT 0.5  PROT 6.6   No results for input(s): LIPASE, AMYLASE in the last 8760 hours. No results for input(s): AMMONIA in the last 8760 hours. CBC: Recent Labs    02/17/21 0750  WBC 7.5  NEUTROABS 3,413  HGB 14.3  HCT 43.1  MCV 93.9  PLT 221   Lipid Panel: Recent Labs    02/17/21 0750  CHOL 207*  HDL 84   LDLCALC 106*  TRIG 76  CHOLHDL 2.5   TSH: Recent Labs    02/17/21 0750  TSH 1.57   A1C: No results found for: HGBA1C   Assessment/Plan 1. Neck pain - began after waking up - exam unremarkable - suspect due to poor posturing and sleeping position - recommend stretching daily - may apply heat applications to neck- 20 minutes prn - recommend purchasing new neck pillow - diclofenac Sodium (VOLTAREN) 1 % topical gel 2 g  2. Left shoulder pain - suspect related to neck pain - see above - exam unremarkable  Total time: 21 minutes. Greater than 50% of total time spent doing patient education on neck/shoulder pain management.   Next appt: none [place] [facility] [place], [facility] [phone]

## 2021-08-30 ENCOUNTER — Other Ambulatory Visit: Payer: Self-pay

## 2021-08-30 MED ORDER — DICLOFENAC SODIUM 1 % EX GEL: 2.0000 g | Freq: Four times a day (QID) | CUTANEOUS | 1 refills | Status: AC

## 2021-08-30 NOTE — Telephone Encounter (Signed)
Incoming call received from patient stating she is at the pharmacy and voltaren gel rx that was suppose to be sent yesterday was not received by the pharmacy.  I reviewed rx and it appeared to be ordered incorrectly. RX was ordered as if it was an injection to be administered in office versus a rx to be picked up at the pharmacy. This error was corrected and rx sent to [facility] on [address]

## 2021-09-08 ENCOUNTER — Ambulatory Visit
Admission: RE | Admit: 2021-09-08 | Discharge: 2021-09-08 | Disposition: A | Discharge status: Home / Self Care | Payer: Medicare HMO | Source: Ambulatory Visit | Attending: Nurse Practitioner | Admitting: Nurse Practitioner

## 2021-09-08 ENCOUNTER — Ambulatory Visit: Payer: 59

## 2021-09-08 DIAGNOSIS — M810 Age-related osteoporosis without current pathological fracture: Secondary | ICD-10-CM

## 2021-10-04 ENCOUNTER — Encounter: Payer: Self-pay | Admitting: Internal Medicine

## 2021-10-07 ENCOUNTER — Ambulatory Visit
Admission: RE | Admit: 2021-10-07 | Discharge: 2021-10-07 | Disposition: A | Discharge status: Home / Self Care | Payer: Medicare HMO | Source: Ambulatory Visit | Attending: Internal Medicine | Admitting: Internal Medicine

## 2021-10-07 ENCOUNTER — Other Ambulatory Visit: Payer: Self-pay

## 2021-10-07 DIAGNOSIS — Z1231 Encounter for screening mammogram for malignant neoplasm of breast: Secondary | ICD-10-CM

## 2021-10-26 ENCOUNTER — Other Ambulatory Visit: Payer: Self-pay

## 2021-10-26 MED ORDER — HYDROCHLOROTHIAZIDE 12.5 MG PO CAPS: ORAL_CAPSULE | ORAL | 1 refills | Status: DC

## 2021-11-30 ENCOUNTER — Telehealth: Payer: Self-pay

## 2021-11-30 NOTE — Telephone Encounter (Signed)
Patient was a walk into clinic requesting Fluticasone to be sent to her [place] but it as removed from her list. Please authorize. To Dr. Lyndel Safe. ?

## 2021-12-01 ENCOUNTER — Other Ambulatory Visit: Payer: Self-pay | Admitting: Internal Medicine

## 2021-12-01 MED ORDER — FLUTICASONE PROPIONATE 50 MCG/ACT NA SUSP: 2.0000 | Freq: Every day | NASAL | 6 refills | Status: DC

## 2021-12-01 NOTE — Telephone Encounter (Signed)
I have Placed the order ?

## 2022-01-16 ENCOUNTER — Encounter: Payer: Self-pay | Admitting: Orthopedic Surgery

## 2022-01-16 ENCOUNTER — Non-Acute Institutional Stay: Payer: Medicare HMO | Admitting: Orthopedic Surgery

## 2022-01-16 VITALS — BP 126/74 | HR 69 | Temp 97.2°F | Resp 18 | Ht 61.0 in | Wt 120.5 lb

## 2022-01-16 DIAGNOSIS — J302 Other seasonal allergic rhinitis: Secondary | ICD-10-CM

## 2022-01-16 DIAGNOSIS — Z0000 Encounter for general adult medical examination without abnormal findings: Secondary | ICD-10-CM | POA: Diagnosis not present

## 2022-01-16 MED ORDER — FLUTICASONE PROPIONATE 50 MCG/ACT NA SUSP
2.0000 | Freq: Every day | NASAL | 6 refills | Status: DC
Start: 2022-01-16 — End: 2023-03-20

## 2022-01-16 NOTE — Progress Notes (Signed)
? ?Subjective:  ? Yolanda [name] is a 87 y.o. female who presents for Medicare Annual (Subsequent) preventive examination. ? ?Place of Service: [facility] ?Provider: Windell Moulding, AGNP-C  ? ?Review of Systems    ? ?Cardiac Risk Factors include: advanced age (>2mn, >>46women);hypertension ? ?   ?Objective:  ?  ?Today's Vitals  ? 01/16/22 1421 01/16/22 1436  ?BP: 126/74   ?Pulse: 69   ?Resp: 18   ?Temp: (!) 97.2 ?F (36.2 ?C)   ?TempSrc: Temporal   ?SpO2: 97%   ?Weight: 120 lb 8 oz (54.7 kg)   ?Height: '5\' 1"'$  (1.549 m)   ?PainSc:  3   ? ?Body mass index is 22.77 kg/m?. ? ? ?  01/16/2022  ?  9:46 AM 08/29/2021  ? 10:10 AM 01/29/2019  ?  1:24 PM 07/24/2018  ? 12:23 PM 02/01/2018  ? 12:59 PM 01/14/2018  ?  3:20 PM 12/11/2017  ?  1:02 PM  ?Advanced Directives  ?Does Patient Have a Medical Advance Directive? Yes Yes Yes Yes Yes No No  ?Type of AParamedicof A[place]Living will [place]Living will [place]Living will [place]Living will [place] SpringsLiving will    ?Does patient want to make changes to medical advance directive? No - Patient declined No - Patient declined No - Patient declined No - Patient declined No - Patient declined    ?Copy of HFeltonin Chart? Yes - validated most recent copy scanned in chart (See row information) Yes - validated most recent copy scanned in chart (See row information) Yes - validated most recent copy scanned in chart (See row information) Yes No - copy requested    ? ? ?Current Medications (verified) ?Outpatient Encounter Medications as of 01/16/2022  ?Medication Sig  ? aspirin EC 81 MG tablet Take 81 mg by mouth daily.  ? Cholecalciferol (VITAMIN D3) 1000 UNITS CAPS Take 2,000 Units by mouth daily.  ? co-enzyme Q-10 30 MG capsule Take 1 capsule (30 mg total) by mouth daily.  ? colesevelam (WELCHOL) 625 MG tablet Take 3 tablets (1,875 mg total) by mouth 2 (two)  times daily with a meal.  ? denosumab (PROLIA) 60 MG/ML SOSY injection Inject 60 mg into the skin every 6 (six) months. In prefilled syringes.  ? diclofenac Sodium (VOLTAREN) 1 % GEL Apply 2 g topically 4 (four) times daily.  ? estradiol (ESTRACE VAGINAL) 0.1 MG/GM vaginal cream Place 1 Applicatorful vaginally 3 (three) times a week.  ? fluticasone (FLONASE) 50 MCG/ACT nasal spray Place 2 sprays into both nostrils daily.  ? hydrochlorothiazide (MICROZIDE) 12.5 MG capsule TAKE 1 CAPSULE BY MOUTH EVERY DAY  ? L-Lysine 1000 MG TABS Take 1,000 mg by mouth daily.  ? loratadine (CLARITIN) 10 MG tablet Take 10 mg by mouth as needed for allergies.  ? Multiple Vitamin (MULTI-VITAMIN PO) Take 1 tablet by mouth daily.  ? omega-3 acid ethyl esters (LOVAZA) 1 g capsule Take 2 capsules (2 g total) by mouth 2 (two) times daily.  ? polyethylene glycol powder (GLYCOLAX/MIRALAX) powder Take 17 g by mouth daily as needed for moderate constipation.  ? pravastatin (PRAVACHOL) 80 MG tablet Take 1 tablet (80 mg total) by mouth daily.  ? Probiotic Product (PROBIOTIC PO) Take 1 capsule by mouth daily. While on antibiotic1  ? ?Facility-Administered Encounter Medications as of 01/16/2022  ?Medication  ? nystatin cream (MYCOSTATIN)  ? ? ?Allergies (verified) ?Sulfonamide derivatives, K+ care [  potassium chloride], Lipitor [atorvastatin calcium], and Sulfa antibiotics  ? ?History: ?Past Medical History:  ?Diagnosis Date  ? Allergy   ? seasonal  ? Anemia   ? Arthritis   ? Benign neoplasm of descending colon 10/22/2014  ? CAD (coronary artery disease)   ? LAD lession   ? Cancer [facility])   ? skin  ? Cataract   ? removed bilaterally  ? Diverticulosis 11/00  ? Esophagitis, acute   ? Fuch's endothelial dystrophy 04/2009  ? Dr, Shanon Rosser   ? Gastritis 11/00  ? GERD (gastroesophageal reflux disease) 11/00  ? Hemorrhoids 8/05  ? Hyperlipidemia   ? Hypertension   ? Lung nodule 1/09  ? NSVD (normal spontaneous vaginal delivery) 1967  ? female   ? Osteopenia   ?  Osteoporosis   ? Thyroid nodule 8/08  ? ?Past Surgical History:  ?Procedure Laterality Date  ? bilateral cataract surgery  06/2007  ? DR. Groat   ? bilateral cataract surgery  10/08  ? Dr. Katy Fitch   ? cardiac cath 40-50%LAD  6/07  ? cardiac cath with angioplasty & stent replacement  4/98  ? COLONOSCOPY    ? CRANIOTOMY Left 05/13/2015  ? Procedure: Left frontotemporal Craniotomy for Meningioma resection;  Surgeon: Consuella Lose, MD;  Location: [facility];  Service: Neurosurgery;  Laterality: Left;  ? CT LUNG SCREENING  6/07  ? FACIAL COSMETIC SURGERY  7/96  ? Dr. Mikle Bosworth    ? MENISCUS REPAIR    ? ?Family History  ?Problem Relation Age of Onset  ? Breast cancer Sister 40  ?     lumpectomy  ? Heart disease Sister   ? Stroke Sister   ? GER disease Sister   ? Dementia Sister   ? Hepatitis Brother   ? Heart attack Brother   ? Stroke Brother   ? Diabetes Mother   ? Hypertension Mother   ? Diabetes Father   ? Hypertension Father   ? Alzheimer's disease Sister   ? Colon cancer Neg Hx   ? ?Social History  ? ?Socioeconomic History  ? Marital status: Married  ?  Spouse name: Yolanda [name]  ? Number of children: 1  ? Years of education: 4  ? Highest education level: Not on file  ?Occupational History  ? Occupation: Retired Pharmacist, hospital  ?Tobacco Use  ? Smoking status: Former  ?  Packs/day: 0.50  ?  Years: 27.00  ?  Pack years: 13.50  ?  Types: Cigarettes  ? Smokeless tobacco: Never  ?Vaping Use  ? Vaping Use: Never used  ?Substance and Sexual Activity  ? Alcohol use: No  ?  Alcohol/week: 0.0 standard drinks  ? Drug use: No  ? Sexual activity: Never  ?Other Topics Concern  ? Not on file  ?Social History Narrative  ? Married to [place] in 1961  ? Lives in [place] at [facility]  ? One child, Yolanda [name]  ? No pets  ? Caffeine  ? Exercises (water aerobics, walking, chair)  ? Living Will, HCPOA, DNR  ? Glasses  ? Hearing Aids  ? ?Social Determinants of Health  ? ?Financial Resource Strain: Low Risk   ? Difficulty of Paying  Living Expenses: Not hard at all  ?Food Insecurity: No Food Insecurity  ? Worried About Charity fundraiser in the Last Year: Never true  ? Ran Out of Food in the Last Year: Never true  ?Transportation Needs: No Transportation Needs  ? Lack of Transportation (Medical): No  ?  Lack of Transportation (Non-Medical): No  ?Physical Activity: Sufficiently Active  ? Days of Exercise per Week: 7 days  ? Minutes of Exercise per Session: 50 min  ?Stress: No Stress Concern Present  ? Feeling of Stress : Only a little  ?Social Connections: Moderately Integrated  ? Frequency of Communication with Friends and Family: Once a week  ? Frequency of Social Gatherings with Friends and Family: More than three times a week  ? Attends Religious Services: More than 4 times per year  ? Active Member of Clubs or Organizations: No  ? Attends Archivist Meetings: Never  ? Marital Status: Married  ? ? ?Tobacco Counseling ?Counseling given: Not Answered ? ? ?Clinical Intake: ? ?Pre-visit preparation completed: Yes ? ?Pain : 0-10 ?Pain Score: 3  ?Pain Type: Neuropathic pain ?Pain Location: Shoulder ?Pain Orientation: Left ?Pain Radiating Towards: left hand ?Pain Descriptors / Indicators: Burning ?Pain Onset: More than a month ago ?Pain Frequency: Intermittent ?Pain Relieving Factors: using neck pillow ? ?Pain Relieving Factors: using neck pillow ? ?BMI - recorded: 22.77 ?Nutritional Status: BMI of 19-24  Normal ?Nutritional Risks: None ?Diabetes: No ? ?How often do you need to have someone help you when you read instructions, pamphlets, or other written materials from your doctor or pharmacy?: 2 - Rarely ?What is the last grade level you completed in school?: 4 year college ? ?Diabetic?No ? ?Interpreter Needed?: No ? ?  ? ? ?Activities of Daily Living ? ?  01/16/2022  ?  2:42 PM  ?In your present state of health, do you have any difficulty performing the following activities:  ?Hearing? 0  ?Vision? 0  ?Difficulty concentrating or making  decisions? 0  ?Walking or climbing stairs? 0  ?Dressing or bathing? 0  ?Doing errands, shopping? 0  ?Preparing Food and eating ? N  ?Using the Toilet? N  ?In the past six months, have you accidently leaked urin

## 2022-01-16 NOTE — Patient Instructions (Signed)
?  Yolanda [name] , ?Thank you for taking time to come for your Medicare Wellness Visit. I appreciate your ongoing commitment to your health goals. Please review the following plan we discussed and let me know if I can assist you in the future.  ? ?These are the goals we discussed: ? Goals   ? ?  Maintain Mobility and Function   ?  Evidence-based guidance:  ?Emphasize the importance of physical activity and aerobic exercise as included in treatment plan; assess barriers to adherence; consider patient's abilities and preferences.  ?Encourage gradual increase in activity or exercise instead of stopping if pain occurs.  ?Reinforce individual therapy exercise prescription, such as strengthening, stabilization and stretching programs.  ?Promote optimal body mechanics to stabilize the spine with lifting and functional activity.  ?Encourage activity and mobility modifications to facilitate optimal function, such as using a log roll for bed mobility or dressing from a seated position.  ?Reinforce individual adaptive equipment recommendations to limit excessive spinal movements, such as a Systems analyst.  ?Assess adequacy of sleep; encourage use of sleep hygiene techniques, such as bedtime routine; use of white noise; dark, cool bedroom; avoiding daytime naps, heavy meals or exercise before bedtime.  ?Promote positions and modification to optimize sleep and sexual activity; consider pillows or positioning devices to assist in maintaining neutral spine.  ?Explore options for applying ergonomic principles at work and home, such as frequent position changes, using ergonomically designed equipment and working at optimal height.  ?Promote modifications to increase comfort with driving such as lumbar support, optimizing seat and steering wheel position, using cruise control and taking frequent rest stops to stretch and walk.   ?Notes:  ?  ? ?  ?  ?This is a list of the screening recommended for you and due dates:  ?Health  Maintenance  ?Topic Date Due  ? Pneumonia Vaccine (2 - PPSV23 if available, else PCV20) 08/21/2014  ? Tetanus Vaccine  04/01/2021  ? Flu Shot  05/02/2022  ? Mammogram  10/07/2022  ? DEXA scan (bone density measurement)  09/09/2023  ? COVID-19 Vaccine  Completed  ? Zoster (Shingles) Vaccine  Completed  ? HPV Vaccine  Aged Out  ? ? ?

## 2022-01-25 ENCOUNTER — Other Ambulatory Visit: Payer: Self-pay | Admitting: Internal Medicine

## 2022-02-02 DIAGNOSIS — I10 Essential (primary) hypertension: Secondary | ICD-10-CM

## 2022-02-02 DIAGNOSIS — M810 Age-related osteoporosis without current pathological fracture: Secondary | ICD-10-CM

## 2022-02-02 DIAGNOSIS — E785 Hyperlipidemia, unspecified: Secondary | ICD-10-CM

## 2022-02-02 DIAGNOSIS — E559 Vitamin D deficiency, unspecified: Secondary | ICD-10-CM

## 2022-02-03 ENCOUNTER — Telehealth: Payer: Self-pay | Admitting: *Deleted

## 2022-02-03 LAB — CBC WITH DIFFERENTIAL/PLATELET
Absolute Monocytes: 765 cells/uL (ref 200–950)
Basophils Absolute: 60 cells/uL (ref 0–200)
Basophils Relative: 0.8 %
Eosinophils Absolute: 293 cells/uL (ref 15–500)
Eosinophils Relative: 3.9 %
HCT: 43.3 % (ref 35.0–45.0)
Hemoglobin: 14.9 g/dL (ref 11.7–15.5)
Lymphs Abs: 2955 cells/uL (ref 850–3900)
MCH: 31.8 pg (ref 27.0–33.0)
MCHC: 34.4 g/dL (ref 32.0–36.0)
MCV: 92.5 fL (ref 80.0–100.0)
MPV: 9.4 fL (ref 7.5–12.5)
Monocytes Relative: 10.2 %
Neutro Abs: 3428 cells/uL (ref 1500–7800)
Neutrophils Relative %: 45.7 %
Platelets: 179 10*3/uL (ref 140–400)
RBC: 4.68 10*6/uL (ref 3.80–5.10)
RDW: 12.2 % (ref 11.0–15.0)
Total Lymphocyte: 39.4 %
WBC: 7.5 10*3/uL (ref 3.8–10.8)

## 2022-02-03 LAB — COMPLETE METABOLIC PANEL WITH GFR
AG Ratio: 1.7 (calc) (ref 1.0–2.5)
ALT: 13 U/L (ref 6–29)
AST: 26 U/L (ref 10–35)
Albumin: 4.4 g/dL (ref 3.6–5.1)
Alkaline phosphatase (APISO): 34 U/L — ABNORMAL LOW (ref 37–153)
BUN/Creatinine Ratio: 22 (calc) (ref 6–22)
BUN: 23 mg/dL (ref 7–25)
CO2: 32 mmol/L (ref 20–32)
Calcium: 10.3 mg/dL (ref 8.6–10.4)
Chloride: 102 mmol/L (ref 98–110)
Creat: 1.05 mg/dL — ABNORMAL HIGH (ref 0.60–0.95)
Globulin: 2.6 g/dL (calc) (ref 1.9–3.7)
Glucose, Bld: 74 mg/dL (ref 65–99)
Potassium: 4 mmol/L (ref 3.5–5.3)
Sodium: 140 mmol/L (ref 135–146)
Total Bilirubin: 0.6 mg/dL (ref 0.2–1.2)
Total Protein: 7 g/dL (ref 6.1–8.1)
eGFR: 52 mL/min/{1.73_m2} — ABNORMAL LOW (ref >=60)

## 2022-02-03 LAB — LIPID PANEL
Cholesterol: 191 mg/dL (ref <200)
HDL: 85 mg/dL (ref >=50)
LDL Cholesterol (Calc): 89 mg/dL (calc)
Non-HDL Cholesterol (Calc): 106 mg/dL (calc) (ref <130)
Total CHOL/HDL Ratio: 2.2 (calc) (ref <5.0)
Triglycerides: 83 mg/dL (ref <150)

## 2022-02-03 LAB — VITAMIN D 25 HYDROXY (VIT D DEFICIENCY, FRACTURES): Vit D, 25-Hydroxy: 44 ng/mL (ref 30–100)

## 2022-02-03 LAB — TSH: TSH: 1.66 mIU/L (ref 0.40–4.50)

## 2022-02-03 NOTE — Telephone Encounter (Signed)
Called [facility] [phone] and spoke with Holli Humbles for Prior Authorization for Prolia.  ? ?APPROVED 02/03/2022-02/04/2023 for 2 Visits.  ? ?Authorization #: [id number] ? ? ?Member ID: [id number] ?Dx: M81.0 ?

## 2022-02-08 ENCOUNTER — Encounter: Payer: Self-pay | Admitting: Internal Medicine

## 2022-02-08 ENCOUNTER — Non-Acute Institutional Stay: Payer: Medicare HMO | Admitting: Internal Medicine

## 2022-02-08 VITALS — BP 163/100 | HR 74 | Temp 98.3°F | Ht 61.0 in | Wt 119.1 lb

## 2022-02-08 DIAGNOSIS — J309 Allergic rhinitis, unspecified: Secondary | ICD-10-CM

## 2022-02-08 DIAGNOSIS — E785 Hyperlipidemia, unspecified: Secondary | ICD-10-CM

## 2022-02-08 DIAGNOSIS — D329 Benign neoplasm of meninges, unspecified: Secondary | ICD-10-CM

## 2022-02-08 DIAGNOSIS — E559 Vitamin D deficiency, unspecified: Secondary | ICD-10-CM

## 2022-02-08 DIAGNOSIS — M810 Age-related osteoporosis without current pathological fracture: Secondary | ICD-10-CM | POA: Diagnosis not present

## 2022-02-08 DIAGNOSIS — M4802 Spinal stenosis, cervical region: Secondary | ICD-10-CM

## 2022-02-08 DIAGNOSIS — R413 Other amnesia: Secondary | ICD-10-CM

## 2022-02-08 DIAGNOSIS — I10 Essential (primary) hypertension: Secondary | ICD-10-CM

## 2022-02-08 DIAGNOSIS — I6523 Occlusion and stenosis of bilateral carotid arteries: Secondary | ICD-10-CM

## 2022-02-09 MED ORDER — PRAVASTATIN SODIUM 80 MG PO TABS: 80.0000 mg | ORAL_TABLET | Freq: Every day | ORAL | 1 refills | Status: DC

## 2022-02-09 MED ORDER — AMLODIPINE BESYLATE 5 MG PO TABS: 5.0000 mg | ORAL_TABLET | Freq: Every day | ORAL | 3 refills | Status: DC

## 2022-02-10 ENCOUNTER — Other Ambulatory Visit: Payer: Self-pay | Admitting: Internal Medicine

## 2022-02-10 NOTE — Progress Notes (Signed)
? ?Location:  [place] ?  ?Place of Service:  Clinic (12) ? ?Provider:  ? ?Code Status:  ?Goals of Care:  ? ?  02/08/2022  ?  2:15 PM  ?Advanced Directives  ?Does Patient Have a Medical Advance Directive? Yes  ?Type of Paramedic of [place];Living will  ?Does patient want to make changes to medical advance directive? No - Patient declined  ?Copy of [place] in Chart? Yes - validated most recent copy scanned in chart (See row information)  ? ? ? ?Chief Complaint  ?Patient presents with  ? Medical Management of Chronic Issues  ?  Patient returns to the clinic for her 6 month follow up.   ? ? ?HPI: Patient is a 87 y.o. female seen today for medical management of chronic diseases.   ? ?Patient has a history of coronary artery disease, hypertension, hyperlipidemia, thyroid nodule, osteoporosis and allergic rhinitis, corneal dystrophy s/p Corneal Transplant,GERD ?S/p craniotomy for meningioma in 2016 ? ?Hypertension ?BP is still running high ?Wants to know if we can change her Meds ?C/o Neck discomfort and radiating down to her back of both shoulders and down the arms ? ?No Other issues ?No Falls ?Very Active Lives with her husband ?Past Medical History:  ?Diagnosis Date  ? Allergy   ? seasonal  ? Anemia   ? Arthritis   ? Benign neoplasm of descending colon 10/22/2014  ? CAD (coronary artery disease)   ? LAD lession   ? Cancer [facility])   ? skin  ? Cataract   ? removed bilaterally  ? Diverticulosis 11/00  ? Esophagitis, acute   ? Fuch's endothelial dystrophy 04/2009  ? Dr, Shanon Rosser   ? Gastritis 11/00  ? GERD (gastroesophageal reflux disease) 11/00  ? Hemorrhoids 8/05  ? Hyperlipidemia   ? Hypertension   ? Lung nodule 1/09  ? NSVD (normal spontaneous vaginal delivery) 1967  ? female   ? Osteopenia   ? Osteoporosis   ? Thyroid nodule 8/08  ? ? ?Past Surgical History:  ?Procedure Laterality Date  ? bilateral cataract surgery  06/2007  ? DR. Groat   ? bilateral cataract surgery   10/08  ? Dr. Katy Fitch   ? cardiac cath 40-50%LAD  6/07  ? cardiac cath with angioplasty & stent replacement  4/98  ? COLONOSCOPY    ? CRANIOTOMY Left 05/13/2015  ? Procedure: Left frontotemporal Craniotomy for Meningioma resection;  Surgeon: Consuella Lose, MD;  Location: [place] [facility];  Service: Neurosurgery;  Laterality: Left;  ? CT LUNG SCREENING  6/07  ? FACIAL COSMETIC SURGERY  7/96  ? Dr. Mikle Bosworth    ? MENISCUS REPAIR    ? ? ?Allergies  ?Allergen Reactions  ? Sulfonamide Derivatives   ? K+ Care [Potassium Chloride] Diarrhea  ? Lipitor [Atorvastatin Calcium]   ?  Myalgias ? ?  ? Sulfa Antibiotics Rash  ?  Fever blisters  ? ? ?Outpatient Encounter Medications as of 02/08/2022  ?Medication Sig  ? amLODipine (NORVASC) 5 MG tablet Take 1 tablet (5 mg total) by mouth daily.  ? aspirin EC 81 MG tablet Take 81 mg by mouth daily.  ? Cholecalciferol (VITAMIN D3) 1000 UNITS CAPS Take 2,000 Units by mouth daily.  ? co-enzyme Q-10 30 MG capsule Take 1 capsule (30 mg total) by mouth daily.  ? colesevelam (WELCHOL) 625 MG tablet Take 3 tablets (1,875 mg total) by mouth 2 (two) times daily with a meal.  ? denosumab (PROLIA) 60 MG/ML  SOSY injection Inject 60 mg into the skin every 6 (six) months. In prefilled syringes.  ? diclofenac Sodium (VOLTAREN) 1 % GEL Apply 2 g topically 4 (four) times daily.  ? estradiol (ESTRACE VAGINAL) 0.1 MG/GM vaginal cream Place 1 Applicatorful vaginally 3 (three) times a week.  ? fluticasone (FLONASE) 50 MCG/ACT nasal spray Place 2 sprays into both nostrils daily.  ? hydrochlorothiazide (MICROZIDE) 12.5 MG capsule TAKE 1 CAPSULE BY MOUTH EVERY DAY  ? L-Lysine 1000 MG TABS Take 1,000 mg by mouth daily.  ? loratadine (CLARITIN) 10 MG tablet Take 10 mg by mouth as needed for allergies.  ? Multiple Vitamin (MULTI-VITAMIN PO) Take 1 tablet by mouth daily.  ? omega-3 acid ethyl esters (LOVAZA) 1 g capsule Take 2 capsules (2 g total) by mouth 2 (two) times daily.  ? polyethylene glycol powder  (GLYCOLAX/MIRALAX) powder Take 17 g by mouth daily as needed for moderate constipation.  ? Probiotic Product (PROBIOTIC PO) Take 1 capsule by mouth daily. While on antibiotic1  ? pravastatin (PRAVACHOL) 80 MG tablet Take 1 tablet (80 mg total) by mouth daily.  ? [DISCONTINUED] pravastatin (PRAVACHOL) 80 MG tablet Take 1 tablet (80 mg total) by mouth daily.  ? ?Facility-Administered Encounter Medications as of 02/08/2022  ?Medication  ? nystatin cream (MYCOSTATIN)  ? ? ?Review of Systems:  ?Review of Systems  ?Constitutional:  Negative for activity change and appetite change.  ?HENT: Negative.    ?Respiratory:  Negative for cough and shortness of breath.   ?Cardiovascular:  Negative for leg swelling.  ?Gastrointestinal:  Negative for constipation.  ?Genitourinary: Negative.   ?Musculoskeletal:  Negative for arthralgias, gait problem and myalgias.  ?Skin: Negative.   ?Neurological:  Negative for dizziness and weakness.  ?Psychiatric/Behavioral:  Negative for confusion, dysphoric mood and sleep disturbance.   ? ?Health Maintenance  ?Topic Date Due  ? INFLUENZA VACCINE  05/02/2022  ? MAMMOGRAM  10/07/2022  ? DEXA SCAN  09/09/2023  ? TETANUS/TDAP  08/03/2031  ? Pneumonia Vaccine 86+ Years old  Completed  ? COVID-19 Vaccine  Completed  ? Zoster Vaccines- Shingrix  Completed  ? HPV VACCINES  Aged Out  ? ? ?Physical Exam: ?Vitals:  ? 02/08/22 1411  ?BP: (!) 163/100  ?Pulse: 74  ?Temp: 98.3 ?F (36.8 ?C)  ?SpO2: 100%  ?Weight: 119 lb 1.6 oz (54 kg)  ?Height: '5\' 1"'$  (1.549 m)  ? ?Body mass index is 22.5 kg/m?Marland Kitchen ?Physical Exam ?Vitals reviewed.  ?Constitutional:   ?   Appearance: Normal appearance.  ?HENT:  ?   Head: Normocephalic.  ?   Nose: Nose normal.  ?   Mouth/Throat:  ?   Mouth: Mucous membranes are moist.  ?   Pharynx: Oropharynx is clear.  ?Eyes:  ?   Pupils: Pupils are equal, round, and reactive to light.  ?Cardiovascular:  ?   Rate and Rhythm: Normal rate and regular rhythm.  ?   Pulses: Normal pulses.  ?   Heart  sounds: Normal heart sounds. No murmur heard. ?Pulmonary:  ?   Effort: Pulmonary effort is normal.  ?   Breath sounds: Normal breath sounds.  ?Abdominal:  ?   General: Abdomen is flat. Bowel sounds are normal.  ?   Palpations: Abdomen is soft.  ?Musculoskeletal:     ?   General: No swelling.  ?   Cervical back: Neck supple.  ?Skin: ?   General: Skin is warm.  ?Neurological:  ?   General: No focal deficit present.  ?  Mental Status: She is alert and oriented to person, place, and time.  ?Psychiatric:     ?   Mood and Affect: Mood normal.     ?   Thought Content: Thought content normal.  ? ? ?Labs reviewed: ?Basic Metabolic Panel: ?Recent Labs  ?  02/17/21 ?0750 02/02/22 ?0810  ?NA 139 140  ?K 4.1 4.0  ?CL 102 102  ?CO2 30 32  ?GLUCOSE 73 74  ?BUN 20 23  ?CREATININE 0.98* 1.05*  ?CALCIUM 9.9 10.3  ?TSH 1.57 1.66  ? ?Liver Function Tests: ?Recent Labs  ?  02/17/21 ?0750 02/02/22 ?0810  ?AST 27 26  ?ALT 13 13  ?BILITOT 0.5 0.6  ?PROT 6.6 7.0  ? ?No results for input(s): LIPASE, AMYLASE in the last 8760 hours. ?No results for input(s): AMMONIA in the last 8760 hours. ?CBC: ?Recent Labs  ?  02/17/21 ?0750 02/02/22 ?0810  ?WBC 7.5 7.5  ?NEUTROABS 3,413 3,428  ?HGB 14.3 14.9  ?HCT 43.1 43.3  ?MCV 93.9 92.5  ?PLT 221 179  ? ?Lipid Panel: ?Recent Labs  ?  02/17/21 ?0750 02/02/22 ?0810  ?CHOL 207* 191  ?HDL 84 85  ?LDLCALC 106* 89  ?TRIG 76 83  ?CHOLHDL 2.5 2.2  ? ?No results found for: HGBA1C ? ?Procedures since last visit: ?No results found. ? ?Assessment/Plan ?1. Essential hypertension ?Started Norvasc ?- COMPLETE METABOLIC PANEL WITH GFR; Future ?- CBC with Differential/Platelet; Future ? ?2. Osteoporosis, postmenopausal ?On Prolia ?Left Forearm Radius 33% 09/08/2021 84.7 -3.2 0.600 g/cm2 ?Left Forearm Radius 33% 08/14/2018 81.6 -3.3 0.594 g/cm2 ?  ?DualFemur Neck Right 09/08/2021 84.7 -1.6 0.822 g/cm2 ?DualFemur Neck Right 08/14/2018 81.6 -1.5 0.830 g/cm2 ?  ?DualFemur Total Mean 09/08/2021 84.7 -0.8 0.911  g/cm2 ?DualFemur Total Mean 08/14/2018 81.6 -1.0 0.883 g/cm2 ?- TSH; Future ?- COMPLETE METABOLIC PANEL WITH GFR; Future ?- CBC with Differential/Platelet; Future ? ?3. Hyperlipidemia, unspecified hyperlipidemia type ?On statin ?- TSH;

## 2022-02-22 NOTE — Progress Notes (Unsigned)
Cardiology Office Note   Date:  02/23/2022   ID:  Yolanda [name], DOB Oct 05, 1934, MRN [id number]  PCP:  Virgie Dad, MD  Cardiologist:   Minus Breeding, MD   Chief Complaint  Patient presents with   Chest Pain       History of Present Illness: Yolanda [name] is a 87 y.o. female who presents for follow up of CAD.  Since I last saw her she has been getting some chest discomfort.  She does this walk that includes a hill every day or so.  She has noticed some discomfort going up that hill and into her jaw.  She is not really sure how different it is from previous because it does not happen every day.  However, she can get it where it is 6 out of 10 in intensity.  There is some substernal discomfort and some in the jaw.  Again she states she has had this for some time but it might be slightly worse.  She is not having any resting symptoms.  She does not get short of breath above and beyond in keeping with what she is doing.  She is not getting any nausea vomiting.  She does not get excessive diaphoresis.  She does not have any resting shortness of breath, PND or orthopnea.  She has no palpitations, presyncope or syncope.  Her blood pressure was recently elevated and she was taken off of HCTZ and put on amlodipine.   Past Medical History:  Diagnosis Date   Allergy    seasonal   Anemia    Arthritis    Benign neoplasm of descending colon 10/22/2014   CAD (coronary artery disease)    LAD lession    Cancer ([place])    skin   Cataract    removed bilaterally   Diverticulosis 11/00   Esophagitis, acute    Fuch's endothelial dystrophy 04/2009   Dr, Shanon Rosser    Gastritis 11/00   GERD (gastroesophageal reflux disease) 11/00   Hemorrhoids 8/05   Hyperlipidemia    Hypertension    Lung nodule 1/09   NSVD (normal spontaneous vaginal delivery) 1967   female    Osteopenia    Osteoporosis    Thyroid nodule 8/08    Past Surgical History:  Procedure Laterality Date   bilateral cataract  surgery  06/2007   DR. Groat    bilateral cataract surgery  10/08   Dr. Katy Fitch    cardiac cath 40-50%LAD  6/07   cardiac cath with angioplasty & stent replacement  4/98   COLONOSCOPY     CRANIOTOMY Left 05/13/2015   Procedure: Left frontotemporal Craniotomy for Meningioma resection;  Surgeon: Consuella Lose, MD;  Location: [place] [facility];  Service: Neurosurgery;  Laterality: Left;   CT LUNG SCREENING  6/07   FACIAL COSMETIC SURGERY  7/96   Dr. Mikle Bosworth     MENISCUS REPAIR       Current Outpatient Medications  Medication Sig Dispense Refill   amLODipine (NORVASC) 5 MG tablet Take 1 tablet (5 mg total) by mouth daily. 90 tablet 3   aspirin EC 81 MG tablet Take 81 mg by mouth daily.     Cholecalciferol (VITAMIN D3) 1000 UNITS CAPS Take 2,000 Units by mouth daily.     co-enzyme Q-10 30 MG capsule Take 1 capsule (30 mg total) by mouth daily. 90 capsule 1   colesevelam (WELCHOL) 625 MG tablet Take 3 tablets (1,875 mg total) by mouth 2 (two) times daily  with a meal. 540 tablet 3   denosumab (PROLIA) 60 MG/ML SOSY injection Inject 60 mg into the skin every 6 (six) months. In prefilled syringes. 1 mL 0   diclofenac Sodium (VOLTAREN) 1 % GEL Apply 2 g topically 4 (four) times daily. 150 g 1   estradiol (ESTRACE VAGINAL) 0.1 MG/GM vaginal cream Place 1 Applicatorful vaginally 3 (three) times a week. 42.5 g 12   fluticasone (FLONASE) 50 MCG/ACT nasal spray Place 2 sprays into both nostrils daily. 16 g 6   L-Lysine 1000 MG TABS Take 1,000 mg by mouth daily.     loratadine (CLARITIN) 10 MG tablet Take 10 mg by mouth as needed for allergies.     Multiple Vitamin (MULTI-VITAMIN PO) Take 1 tablet by mouth daily.     omega-3 acid ethyl esters (LOVAZA) 1 g capsule Take 2 capsules (2 g total) by mouth 2 (two) times daily. 360 capsule 3   polyethylene glycol powder (GLYCOLAX/MIRALAX) powder Take 17 g by mouth daily as needed for moderate constipation. 3350 g 1   pravastatin (PRAVACHOL) 80 MG tablet Take 1  tablet (80 mg total) by mouth daily. 90 tablet 1   Probiotic Product (PROBIOTIC PO) Take 1 capsule by mouth daily. While on antibiotic1     Current Facility-Administered Medications  Medication Dose Route Frequency Provider Last Rate Last Admin   nystatin cream (MYCOSTATIN)   Topical BID Virgie Dad, MD        Allergies:   Sulfonamide derivatives, K+ care [potassium chloride], Lipitor [atorvastatin calcium], and Sulfa antibiotics    ROS:  Please see the history of present illness.   Otherwise, review of systems are positive for tingling in her hands.   All other systems are reviewed and negative.    PHYSICAL EXAM: VS:  BP 126/76   Pulse 70   Ht '5\' 1"'$  (1.549 m)   Wt 121 lb 9.6 oz (55.2 kg)   SpO2 93%   BMI 22.98 kg/m  , BMI Body mass index is 22.98 kg/m. GENERAL:  Well appearing NECK:  No jugular venous distention, waveform within normal limits, carotid upstroke brisk and symmetric, no bruits, no thyromegaly LUNGS:  Clear to auscultation bilaterally CHEST:  Unremarkable HEART:  PMI not displaced or sustained,S1 and S2 within normal limits, no S3, no S4, no clicks, no rubs, no murmurs ABD:  Flat, positive bowel sounds normal in frequency in pitch, no bruits, no rebound, no guarding, no midline pulsatile mass, no hepatomegaly, no splenomegaly EXT:  2 plus pulses throughout, no edema, no cyanosis no clubbing   EKG:  EKG is  ordered today. The ekg ordered today demonstrates sinus rhythm, rate 70, axis within normal limits, low voltage in the limb leads, poor anterior R wave progression.  Nonspecific inferior and lateral ST changes.   Recent Labs: 02/02/2022: ALT 13; BUN 23; Creat 1.05; Hemoglobin 14.9; Platelets 179; Potassium 4.0; Sodium 140; TSH 1.66    Lipid Panel    Component Value Date/Time   CHOL 191 02/02/2022 0810   CHOL 178 05/02/2017 0944   CHOL 192 04/02/2013 0930   TRIG 83 02/02/2022 0810   TRIG 57 12/20/2016 1058   TRIG 78 04/02/2013 0930   HDL 85 02/02/2022  0810   HDL 94 05/02/2017 0944   HDL 89 12/20/2016 1058   HDL 82 04/02/2013 0930   CHOLHDL 2.2 02/02/2022 0810   LDLCALC 89 02/02/2022 0810   LDLCALC 89 04/14/2014 1011   LDLCALC 94 04/02/2013 0930  Wt Readings from Last 3 Encounters:  02/23/22 121 lb 9.6 oz (55.2 kg)  02/08/22 119 lb 1.6 oz (54 kg)  01/16/22 120 lb 8 oz (54.7 kg)      Other studies Reviewed: Additional studies/ records that were reviewed today include: Labs. Review of the above records demonstrates:  Please see elsewhere in the note.     ASSESSMENT AND PLAN:  CAD, UNSPECIFIED SITE -  Given the exertional chest discomfort that might be slightly more intense I would like to start by screening her for progression of her disease with a POET (Plain Old Exercise Treadmill)   HYPERTENSION, UNSPECIFIED -  The blood pressure is at target.  She will continue the amlodipine.  HYPERLIPIDEMIA-MIXED -  Her LDL is 89 with an HDL of 85.  I think this ratio is reasonable.    Current medicines are reviewed at length with the patient today.  The patient does not have concerns regarding medicines.  The following changes have been made:  None  Labs/ tests ordered today include:   Orders Placed This Encounter  Procedures   EXERCISE TOLERANCE TEST (ETT)   EKG 12-Lead      Disposition:   FU with me in 12 months.    Signed, Minus Breeding, MD  02/23/2022 2:15 PM    [facility]

## 2022-02-23 ENCOUNTER — Ambulatory Visit (INDEPENDENT_AMBULATORY_CARE_PROVIDER_SITE_OTHER): Payer: Medicare HMO | Admitting: Cardiology

## 2022-02-23 ENCOUNTER — Encounter: Payer: Self-pay | Admitting: Cardiology

## 2022-02-23 VITALS — BP 126/76 | HR 70 | Ht 61.0 in | Wt 121.6 lb

## 2022-02-23 DIAGNOSIS — E785 Hyperlipidemia, unspecified: Secondary | ICD-10-CM | POA: Diagnosis not present

## 2022-02-23 DIAGNOSIS — I10 Essential (primary) hypertension: Secondary | ICD-10-CM

## 2022-02-23 DIAGNOSIS — I2510 Atherosclerotic heart disease of native coronary artery without angina pectoris: Secondary | ICD-10-CM | POA: Diagnosis not present

## 2022-02-23 DIAGNOSIS — R072 Precordial pain: Secondary | ICD-10-CM | POA: Diagnosis not present

## 2022-02-23 NOTE — Patient Instructions (Signed)
Medication Instructions:   No changes *If you need a refill on your cardiac medications before your next appointment, please call your pharmacy*   Lab Work: Not needed    Testing/Procedures: Your physician has requested that you have an exercise tolerance test. Please also follow instruction sheet, as given.    Follow-Up: At [facility], you and your health needs are our priority.  As part of our continuing mission to provide you with exceptional heart care, we have created designated Provider Care Teams.  These Care Teams include your primary Cardiologist (physician) and Advanced Practice Providers (APPs -  Physician Assistants and Nurse Practitioners) who all work together to provide you with the care you need, when you need it.     Your next appointment:   12 month(s)  The format for your next appointment:   person  Provider:   Minus Breeding, MD    Other Instructions   Your physician has requested that you have an exercise tolerance test. For further information please visit [email or web address]. Please also follow instruction sheet, as given. This will take place at [place], [address]. Do not drink or eat foods with caffeine for 24 hours before the test. (Chocolate, coffee, tea, or energy drinks) If you use an inhaler, bring it with you to the test. Do not smoke for 4 hours before the test. Wear comfortable shoes and clothing.

## 2022-02-28 ENCOUNTER — Telehealth (HOSPITAL_COMMUNITY): Payer: Self-pay | Admitting: *Deleted

## 2022-02-28 ENCOUNTER — Ambulatory Visit (INDEPENDENT_AMBULATORY_CARE_PROVIDER_SITE_OTHER): Payer: Medicare HMO | Admitting: *Deleted

## 2022-02-28 DIAGNOSIS — M810 Age-related osteoporosis without current pathological fracture: Secondary | ICD-10-CM | POA: Diagnosis not present

## 2022-02-28 MED ORDER — DENOSUMAB 60 MG/ML ~~LOC~~ SOSY
60.0000 mg | PREFILLED_SYRINGE | Freq: Once | SUBCUTANEOUS | Status: AC
  Administered 2022-02-28: 60 mg via SUBCUTANEOUS

## 2022-02-28 NOTE — Telephone Encounter (Signed)
Close encounter 

## 2022-03-01 ENCOUNTER — Ambulatory Visit (HOSPITAL_COMMUNITY)
Admission: RE | Admit: 2022-03-01 | Discharge: 2022-03-01 | Disposition: A | Discharge status: Home / Self Care | Payer: Medicare HMO | Source: Ambulatory Visit | Attending: Cardiology | Admitting: Cardiology

## 2022-03-01 DIAGNOSIS — I2510 Atherosclerotic heart disease of native coronary artery without angina pectoris: Secondary | ICD-10-CM | POA: Diagnosis present

## 2022-03-02 ENCOUNTER — Other Ambulatory Visit (HOSPITAL_COMMUNITY): Payer: Self-pay | Admitting: Cardiology

## 2022-03-02 ENCOUNTER — Other Ambulatory Visit: Payer: Self-pay | Admitting: Cardiology

## 2022-03-02 DIAGNOSIS — I2510 Atherosclerotic heart disease of native coronary artery without angina pectoris: Secondary | ICD-10-CM

## 2022-03-02 LAB — EXERCISE TOLERANCE TEST
Angina Index: 0
Duke Treadmill Score: 6
Estimated workload: 7.2
Exercise duration (min): 6 min
Exercise duration (sec): 10 s
MPHR: 135 {beats}/min
Peak HR: 127 {beats}/min
Percent HR: 94 %
Rest HR: 70 {beats}/min
ST Depression (mm): 0 mm

## 2022-04-03 ENCOUNTER — Encounter: Payer: Self-pay | Admitting: Orthopedic Surgery

## 2022-04-03 ENCOUNTER — Non-Acute Institutional Stay: Payer: Medicare HMO | Admitting: Orthopedic Surgery

## 2022-04-03 VITALS — BP 142/70 | HR 63 | Temp 97.3°F | Resp 16 | Ht 61.0 in | Wt 120.7 lb

## 2022-04-03 DIAGNOSIS — E785 Hyperlipidemia, unspecified: Secondary | ICD-10-CM

## 2022-04-03 DIAGNOSIS — I2510 Atherosclerotic heart disease of native coronary artery without angina pectoris: Secondary | ICD-10-CM

## 2022-04-03 DIAGNOSIS — I10 Essential (primary) hypertension: Secondary | ICD-10-CM

## 2022-04-03 NOTE — Progress Notes (Signed)
Location:  [place] of Service:  [facility] (12) Provider:  Yvonna Alanis, NP   Virgie Dad, MD  Patient Care Team: Virgie Dad, MD as PCP - General (Internal Medicine) Minus Breeding, MD as PCP - Cardiology (Cardiology) Minus Breeding, MD (Cardiology) Clent Jacks, MD (Ophthalmology) Inda Castle, MD (Inactive) as Consulting Physician (Gastroenterology)  Extended Emergency Contact Information Primary Emergency Contact: Concepcion Elk Address: [address]          [place],  [address] [name] of [place] Phone: [phone] Relation: Spouse  Code Status:  Full code Goals of care: Advanced Directive information    04/03/2022    1:18 PM  Advanced Directives  Does Patient Have a Medical Advance Directive? Yes  Type of Paramedic of [place];Living will  Does patient want to make changes to medical advance directive? No - Patient declined  Copy of [place] in Chart? Yes - validated most recent copy scanned in chart (See row information)     Chief Complaint  Patient presents with   Follow-up    2 month blood pressure follow up.    HPI:  Yolanda [name] is a 87 y.o. female seen today for acute visit due to elevated blood pressure.   H/o CAD, HTN, HLD. 05/10 she was started on amlodipine for elevated blood pressure. She is taking medication at prescribed. She does not take blood pressures at home, but does have a bp machine. Denies chest pain, sob, headaches, blurred vision, dizziness or ankle swelling. Does not add salt to foods. Remains on asa and pravastatin for CAD.   No other health concerns today.     Past Medical History:  Diagnosis Date   Allergy    seasonal   Anemia    Arthritis    Benign neoplasm of descending colon 10/22/2014   CAD (coronary artery disease)    LAD lession    Cancer ([place])    skin   Cataract    removed bilaterally   Diverticulosis  11/00   Esophagitis, acute    Fuch's endothelial dystrophy 04/2009   Dr, Shanon Rosser    Gastritis 11/00   GERD (gastroesophageal reflux disease) 11/00   Hemorrhoids 8/05   Hyperlipidemia    Hypertension    Lung nodule 1/09   NSVD (normal spontaneous vaginal delivery) 1967   female    Osteopenia    Osteoporosis    Thyroid nodule 8/08   Past Surgical History:  Procedure Laterality Date   bilateral cataract surgery  06/2007   DR. Groat    bilateral cataract surgery  10/08   Dr. Katy Fitch    cardiac cath 40-50%LAD  6/07   cardiac cath with angioplasty & stent replacement  4/98   COLONOSCOPY     CRANIOTOMY Left 05/13/2015   Procedure: Left frontotemporal Craniotomy for Meningioma resection;  Surgeon: Consuella Lose, MD;  Location: [facility];  Service: Neurosurgery;  Laterality: Left;   CT LUNG SCREENING  6/07   FACIAL COSMETIC SURGERY  7/96   Dr. Mikle Bosworth     MENISCUS REPAIR      Allergies  Allergen Reactions   Sulfonamide Derivatives    K+ Care [Potassium Chloride] Diarrhea   Lipitor [Atorvastatin Calcium]     Myalgias     Sulfa Antibiotics Rash    Fever blisters    Outpatient Encounter Medications as of 04/03/2022  Medication Sig   amLODipine (NORVASC) 5 MG  tablet Take 1 tablet (5 mg total) by mouth daily.   aspirin EC 81 MG tablet Take 81 mg by mouth daily.   Cholecalciferol (VITAMIN D3) 1000 UNITS CAPS Take 2,000 Units by mouth daily.   co-enzyme Q-10 30 MG capsule Take 1 capsule (30 mg total) by mouth daily.   colesevelam (WELCHOL) 625 MG tablet Take 3 tablets (1,875 mg total) by mouth 2 (two) times daily with a meal.   denosumab (PROLIA) 60 MG/ML SOSY injection Inject 60 mg into the skin every 6 (six) months. In prefilled syringes.   diclofenac Sodium (VOLTAREN) 1 % GEL Apply 2 g topically 4 (four) times daily.   estradiol (ESTRACE VAGINAL) 0.1 MG/GM vaginal cream Place 1 Applicatorful vaginally 3 (three) times a week.   fluticasone (FLONASE) 50 MCG/ACT nasal spray Place  2 sprays into both nostrils daily.   L-Lysine 1000 MG TABS Take 1,000 mg by mouth daily.   loratadine (CLARITIN) 10 MG tablet Take 10 mg by mouth as needed for allergies.   Multiple Vitamin (MULTI-VITAMIN PO) Take 1 tablet by mouth daily.   omega-3 acid ethyl esters (LOVAZA) 1 g capsule Take 2 capsules (2 g total) by mouth 2 (two) times daily.   polyethylene glycol powder (GLYCOLAX/MIRALAX) powder Take 17 g by mouth daily as needed for moderate constipation.   pravastatin (PRAVACHOL) 80 MG tablet Take 1 tablet (80 mg total) by mouth daily.   Probiotic Product (PROBIOTIC PO) Take 1 capsule by mouth daily. While on antibiotic1   Facility-Administered Encounter Medications as of 04/03/2022  Medication   nystatin cream (MYCOSTATIN)    Review of Systems  Constitutional:  Negative for activity change, appetite change, chills, fatigue and fever.  Respiratory:  Negative for cough, shortness of breath and wheezing.   Cardiovascular:  Negative for chest pain, palpitations and leg swelling.  Neurological:  Negative for dizziness and headaches.  Psychiatric/Behavioral:  Negative for confusion and dysphoric mood. The patient is not nervous/anxious.     Immunization History  Administered Date(s) Administered   Influenza, High Dose Seasonal PF 07/12/2015, 06/27/2016, 07/02/2017, 06/29/2019, 07/07/2021   Influenza-Unspecified 07/02/2017, 06/22/2020, 07/07/2021   Moderna Sars-Covid-2 Vaccination 10/06/2019, 11/03/2019   PFIZER(Purple Top)SARS-COV-2 Vaccination 07/29/2020, 03/09/2021   Pfizer Covid-19 Vaccine Bivalent Booster 45yr & up 06/22/2021   Pneumococcal Conjugate-13 08/21/2013   Pneumococcal Polysaccharide-23 08/02/2021   Tdap 08/02/2021   Zoster Recombinat (Shingrix) 04/18/2018, 07/22/2018   Pertinent  Health Maintenance Due  Topic Date Due   INFLUENZA VACCINE  05/02/2022   MAMMOGRAM  10/07/2022   DEXA SCAN  09/09/2023      08/29/2021   10:09 AM 01/16/2022    2:26 PM 01/16/2022     2:42 PM 02/08/2022    2:14 PM 04/03/2022    1:18 PM  Fall Risk  Falls in the past year? 0 0 0 0 0  Was there an injury with Fall? 0 0 0 0 0  Fall Risk Category Calculator 0 0 0 0 0  Fall Risk Category Low Low Low Low Low  Patient Fall Risk Level Low fall risk Low fall risk  Low fall risk Low fall risk  Patient at Risk for Falls Due to No Fall Risks No Fall Risks History of fall(s);Impaired balance/gait;Impaired mobility No Fall Risks No Fall Risks  Fall risk Follow up Falls evaluation completed Falls evaluation completed Falls evaluation completed;Education provided;Falls prevention discussed Falls evaluation completed Falls evaluation completed   Functional Status Survey:    Vitals:   04/03/22 1126  BP: (Marland Kitchen  142/70  Pulse: 63  Resp: 16  Temp: (!) 97.3 F (36.3 C)  SpO2: 98%  Weight: 120 lb 11.2 oz (54.7 kg)  Height: '5\' 1"'$  (1.549 m)   Body mass index is 22.81 kg/m. Physical Exam Vitals reviewed.  Constitutional:      General: She is not in acute distress. HENT:     Head: Normocephalic.  Eyes:     General:        Right eye: No discharge.        Left eye: No discharge.  Neck:     Vascular: No carotid bruit.  Cardiovascular:     Rate and Rhythm: Normal rate and regular rhythm.     Pulses: Normal pulses.     Heart sounds: Normal heart sounds.  Pulmonary:     Effort: Pulmonary effort is normal. No respiratory distress.     Breath sounds: Normal breath sounds. No wheezing.  Abdominal:     General: Bowel sounds are normal. There is no distension.     Palpations: Abdomen is soft.     Tenderness: There is no abdominal tenderness.  Musculoskeletal:     Cervical back: Neck supple.     Right lower leg: No edema.     Left lower leg: No edema.  Lymphadenopathy:     Cervical: No cervical adenopathy.  Skin:    General: Skin is warm and dry.     Capillary Refill: Capillary refill takes less than 2 seconds.  Neurological:     General: No focal deficit present.     Mental  Status: She is alert and oriented to person, place, and time.  Psychiatric:        Mood and Affect: Mood normal.        Behavior: Behavior normal.     Labs reviewed: Recent Labs    02/02/22 0810  NA 140  K 4.0  CL 102  CO2 32  GLUCOSE 74  BUN 23  CREATININE 1.05*  CALCIUM 10.3   Recent Labs    02/02/22 0810  AST 26  ALT 13  BILITOT 0.6  PROT 7.0   Recent Labs    02/02/22 0810  WBC 7.5  NEUTROABS 3,428  HGB 14.9  HCT 43.3  MCV 92.5  PLT 179   Lab Results  Component Value Date   TSH 1.66 02/02/2022   No results found for: "HGBA1C" Lab Results  Component Value Date   CHOL 191 02/02/2022   HDL 85 02/02/2022   LDLCALC 89 02/02/2022   TRIG 83 02/02/2022   CHOLHDL 2.2 02/02/2022    Significant Diagnostic Results in last 30 days:  No results found.  Assessment/Plan 1. Essential hypertension - controlled - exam unremarkable - asymptomatic - cont amlodipine - cont low sodium diet  2. Hyperlipidemia, unspecified hyperlipidemia type - LDL 89 02/02/2022 - cont statin  3. Atherosclerosis of native coronary artery of native heart without angina pectoris - followed by cardiology - cont asa and statin - exercise stress test ordered 02/24/2022  Total time: 30 minutes. Greater than 50% of total time spent doing patient education regarding elevated blood pressure, diet and symptoms of low/high blood pressure.      Family/ staff Communication: plan discussed with patient   Labs/tests ordered:  none

## 2022-04-03 NOTE — Patient Instructions (Signed)
Continue to take amlodipine  Limit salt in diet

## 2022-05-09 ENCOUNTER — Other Ambulatory Visit: Payer: Self-pay | Admitting: Internal Medicine

## 2022-07-29 ENCOUNTER — Other Ambulatory Visit: Payer: Self-pay | Admitting: Internal Medicine

## 2022-07-31 NOTE — Telephone Encounter (Signed)
Patient has request refill on medication Pravastatin '80mg'$ . Patient medication has Allergy Contraindications. Medication pend and sent to PCP Virgie Dad, MD for approval.

## 2022-09-01 ENCOUNTER — Ambulatory Visit (INDEPENDENT_AMBULATORY_CARE_PROVIDER_SITE_OTHER): Payer: Medicare HMO | Admitting: Internal Medicine

## 2022-09-01 DIAGNOSIS — M810 Age-related osteoporosis without current pathological fracture: Secondary | ICD-10-CM | POA: Diagnosis not present

## 2022-09-01 MED ORDER — DENOSUMAB 60 MG/ML ~~LOC~~ SOSY
60.0000 mg | PREFILLED_SYRINGE | Freq: Once | SUBCUTANEOUS | Status: AC
  Administered 2022-09-01: 60 mg via SUBCUTANEOUS

## 2022-09-01 NOTE — Progress Notes (Unsigned)
Patient tolerated well. No Complaints.

## 2022-09-13 ENCOUNTER — Telehealth: Payer: Self-pay

## 2022-09-13 NOTE — Telephone Encounter (Signed)
Attempted to call this patient because she thought she was suppose to have an appointment this morning. LVM to return call

## 2022-09-18 ENCOUNTER — Other Ambulatory Visit: Payer: Self-pay | Admitting: Internal Medicine

## 2022-09-18 DIAGNOSIS — Z1231 Encounter for screening mammogram for malignant neoplasm of breast: Secondary | ICD-10-CM

## 2022-09-20 ENCOUNTER — Other Ambulatory Visit: Payer: Self-pay | Admitting: Internal Medicine

## 2022-09-20 DIAGNOSIS — E785 Hyperlipidemia, unspecified: Secondary | ICD-10-CM

## 2022-09-20 DIAGNOSIS — I10 Essential (primary) hypertension: Secondary | ICD-10-CM

## 2022-09-20 DIAGNOSIS — M810 Age-related osteoporosis without current pathological fracture: Secondary | ICD-10-CM

## 2022-09-21 ENCOUNTER — Other Ambulatory Visit: Payer: Medicare HMO

## 2022-09-21 DIAGNOSIS — M810 Age-related osteoporosis without current pathological fracture: Secondary | ICD-10-CM

## 2022-09-21 DIAGNOSIS — I10 Essential (primary) hypertension: Secondary | ICD-10-CM

## 2022-09-21 DIAGNOSIS — E785 Hyperlipidemia, unspecified: Secondary | ICD-10-CM

## 2022-09-21 LAB — LIPID PANEL
Cholesterol: 191 mg/dL (ref <200)
HDL: 82 mg/dL (ref >=50)
LDL Cholesterol (Calc): 93 mg/dL (calc)
Non-HDL Cholesterol (Calc): 109 mg/dL (calc) (ref <130)
Total CHOL/HDL Ratio: 2.3 (calc) (ref <5.0)
Triglycerides: 74 mg/dL (ref <150)

## 2022-09-21 LAB — COMPLETE METABOLIC PANEL WITH GFR
AG Ratio: 1.6 (calc) (ref 1.0–2.5)
ALT: 11 U/L (ref 6–29)
AST: 33 U/L (ref 10–35)
Albumin: 4.1 g/dL (ref 3.6–5.1)
Alkaline phosphatase (APISO): 36 U/L — ABNORMAL LOW (ref 37–153)
BUN/Creatinine Ratio: 19 (calc) (ref 6–22)
BUN: 21 mg/dL (ref 7–25)
CO2: 29 mmol/L (ref 20–32)
Calcium: 9.9 mg/dL (ref 8.6–10.4)
Chloride: 105 mmol/L (ref 98–110)
Creat: 1.1 mg/dL — ABNORMAL HIGH (ref 0.60–0.95)
Globulin: 2.6 g/dL (calc) (ref 1.9–3.7)
Glucose, Bld: 83 mg/dL (ref 65–99)
Potassium: 4.5 mmol/L (ref 3.5–5.3)
Sodium: 142 mmol/L (ref 135–146)
Total Bilirubin: 0.6 mg/dL (ref 0.2–1.2)
Total Protein: 6.7 g/dL (ref 6.1–8.1)
eGFR: 49 mL/min/{1.73_m2} — ABNORMAL LOW (ref >=60)

## 2022-09-21 LAB — CBC WITH DIFFERENTIAL/PLATELET
Absolute Monocytes: 669 cells/uL (ref 200–950)
Basophils Absolute: 48 cells/uL (ref 0–200)
Basophils Relative: 0.7 %
Eosinophils Absolute: 269 cells/uL (ref 15–500)
Eosinophils Relative: 3.9 %
HCT: 39.6 % (ref 35.0–45.0)
Hemoglobin: 13.4 g/dL (ref 11.7–15.5)
Lymphs Abs: 2567 cells/uL (ref 850–3900)
MCH: 31.6 pg (ref 27.0–33.0)
MCHC: 33.8 g/dL (ref 32.0–36.0)
MCV: 93.4 fL (ref 80.0–100.0)
MPV: 9.6 fL (ref 7.5–12.5)
Monocytes Relative: 9.7 %
Neutro Abs: 3347 cells/uL (ref 1500–7800)
Neutrophils Relative %: 48.5 %
Platelets: 181 10*3/uL (ref 140–400)
RBC: 4.24 10*6/uL (ref 3.80–5.10)
RDW: 11.9 % (ref 11.0–15.0)
Total Lymphocyte: 37.2 %
WBC: 6.9 10*3/uL (ref 3.8–10.8)

## 2022-09-22 LAB — TSH: TSH: 1.17 mIU/L (ref 0.40–4.50)

## 2022-09-26 ENCOUNTER — Telehealth: Payer: Self-pay | Admitting: *Deleted

## 2022-09-26 NOTE — Telephone Encounter (Signed)
She can have vaccine

## 2022-09-26 NOTE — Telephone Encounter (Signed)
Patient called and stated that [facility] will be at the Facility on Friday 09/29/22 giving the RSV injections. Patient stated that they have to have Recommendation from their PCP in order to get the injection.   Please Advise.

## 2022-09-27 NOTE — Telephone Encounter (Signed)
LMOM with Dr. Steve Rattler Response for patient.

## 2022-10-11 ENCOUNTER — Encounter: Payer: Self-pay | Admitting: Internal Medicine

## 2022-10-11 ENCOUNTER — Non-Acute Institutional Stay: Payer: Medicare HMO | Admitting: Internal Medicine

## 2022-10-11 VITALS — BP 128/78 | HR 79 | Temp 97.8°F | Resp 17 | Ht 61.0 in | Wt 119.0 lb

## 2022-10-11 DIAGNOSIS — M810 Age-related osteoporosis without current pathological fracture: Secondary | ICD-10-CM

## 2022-10-11 DIAGNOSIS — N181 Chronic kidney disease, stage 1: Secondary | ICD-10-CM

## 2022-10-11 DIAGNOSIS — E785 Hyperlipidemia, unspecified: Secondary | ICD-10-CM | POA: Diagnosis not present

## 2022-10-11 DIAGNOSIS — I10 Essential (primary) hypertension: Secondary | ICD-10-CM | POA: Diagnosis not present

## 2022-10-13 NOTE — Progress Notes (Signed)
Location:  [facility] (12)  Provider:   Code Status:  Goals of Care:     10/11/2022    3:43 PM  Advanced Directives  Does Patient Have a Medical Advance Directive? Yes  Type of Paramedic of [place];Living will;Out of facility DNR (pink MOST or yellow form)     Chief Complaint  Patient presents with   Medical Management of Chronic Issues    Patient is coming in for a routine check up   Quality Metric Gaps    Discussed the need for AWV and mammogram    HPI: Patient is a 88 y.o. female seen today for medical management of chronic diseases.    Lives in [facility] with her husband  Patient has a history of coronary artery disease, hypertension, hyperlipidemia, thyroid nodule, osteoporosis and allergic rhinitis, corneal dystrophy s/p Corneal Transplant,GERD S/p craniotomy for meningioma in 2016  BP doing well on Norvasc No Other issues today Walks well  No Falls Got all her vaccines Past Medical History:  Diagnosis Date   Allergy    seasonal   Anemia    Arthritis    Benign neoplasm of descending colon 10/22/2014   CAD (coronary artery disease)    LAD lession    Cancer ([place])    skin   Cataract    removed bilaterally   Diverticulosis 11/00   Esophagitis, acute    Fuch's endothelial dystrophy 04/2009   Dr, Shanon Rosser    Gastritis 11/00   GERD (gastroesophageal reflux disease) 11/00   Hemorrhoids 8/05   Hyperlipidemia    Hypertension    Lung nodule 1/09   NSVD (normal spontaneous vaginal delivery) 1967   female    Osteopenia    Osteoporosis    Thyroid nodule 8/08    Past Surgical History:  Procedure Laterality Date   bilateral cataract surgery  06/2007   DR. Groat    bilateral cataract surgery  10/08   Dr. Katy Fitch    cardiac cath 40-50%LAD  6/07   cardiac cath with angioplasty & stent replacement  4/98   COLONOSCOPY     CRANIOTOMY Left 05/13/2015   Procedure: Left frontotemporal Craniotomy for Meningioma  resection;  Surgeon: Consuella Lose, MD;  Location: [facility];  Service: Neurosurgery;  Laterality: Left;   CT LUNG SCREENING  6/07   FACIAL COSMETIC SURGERY  7/96   Dr. Mikle Bosworth     MENISCUS REPAIR      Allergies  Allergen Reactions   Sulfonamide Derivatives    K+ Care [Potassium Chloride] Diarrhea   Lipitor [Atorvastatin Calcium]     Myalgias     Sulfa Antibiotics Rash    Fever blisters    Outpatient Encounter Medications as of 10/11/2022  Medication Sig   amLODipine (NORVASC) 5 MG tablet Take 1 tablet (5 mg total) by mouth daily.   aspirin EC 81 MG tablet Take 81 mg by mouth daily.   Cholecalciferol (VITAMIN D3) 1000 UNITS CAPS Take 2,000 Units by mouth daily.   co-enzyme Q-10 30 MG capsule Take 1 capsule (30 mg total) by mouth daily.   colesevelam (WELCHOL) 625 MG tablet Take 3 tablets (1,875 mg total) by mouth 2 (two) times daily with a meal.   denosumab (PROLIA) 60 MG/ML SOSY injection Inject 60 mg into the skin every 6 (six) months. In prefilled syringes.   diclofenac Sodium (VOLTAREN) 1 % GEL Apply 2 g topically 4 (four) times daily.   estradiol (  ESTRACE VAGINAL) 0.1 MG/GM vaginal cream Place 1 Applicatorful vaginally 3 (three) times a week.   L-Lysine 1000 MG TABS Take 1,000 mg by mouth daily.   loratadine (CLARITIN) 10 MG tablet Take 10 mg by mouth as needed for allergies.   Multiple Vitamin (MULTI-VITAMIN PO) Take 1 tablet by mouth daily.   omega-3 acid ethyl esters (LOVAZA) 1 g capsule TAKE 2 CAPSULES TWICE DAILY   polyethylene glycol powder (GLYCOLAX/MIRALAX) powder Take 17 g by mouth daily as needed for moderate constipation.   pravastatin (PRAVACHOL) 80 MG tablet TAKE 1 TABLET DAILY   Probiotic Product (PROBIOTIC PO) Take 1 capsule by mouth daily. While on antibiotic1   fluticasone (FLONASE) 50 MCG/ACT nasal spray Place 2 sprays into both nostrils daily.   Facility-Administered Encounter Medications as of 10/11/2022  Medication   nystatin cream (MYCOSTATIN)     Review of Systems:  Review of Systems  Constitutional:  Negative for activity change and appetite change.  HENT: Negative.    Respiratory:  Negative for cough and shortness of breath.   Cardiovascular:  Negative for leg swelling.  Gastrointestinal:  Negative for constipation.  Genitourinary: Negative.   Musculoskeletal:  Negative for arthralgias, gait problem and myalgias.  Skin: Negative.   Neurological:  Negative for dizziness and weakness.  Psychiatric/Behavioral:  Negative for confusion, dysphoric mood and sleep disturbance.     Health Maintenance  Topic Date Due   Medicare Annual Wellness (AWV)  02/02/2019   MAMMOGRAM  10/07/2022   COVID-19 Vaccine (7 - 2023-24 season) 10/27/2022 (Originally 10/03/2022)   DEXA SCAN  09/09/2023   DTaP/Tdap/Td (2 - Td or Tdap) 08/03/2031   Pneumonia Vaccine 67+ Years old  Completed   INFLUENZA VACCINE  Completed   Zoster Vaccines- Shingrix  Completed   HPV VACCINES  Aged Out    Physical Exam: Vitals:   10/11/22 1540  BP: 128/78  Pulse: 79  Resp: 17  Temp: 97.8 F (36.6 C)  TempSrc: Temporal  SpO2: 94%  Weight: 119 lb (54 kg)  Height: '5\' 1"'$  (1.549 m)   Body mass index is 22.48 kg/m. Physical Exam Vitals reviewed.  Constitutional:      Appearance: Normal appearance.  HENT:     Head: Normocephalic.     Nose: Nose normal.     Mouth/Throat:     Mouth: Mucous membranes are moist.     Pharynx: Oropharynx is clear.  Eyes:     Pupils: Pupils are equal, round, and reactive to light.  Cardiovascular:     Rate and Rhythm: Normal rate and regular rhythm.     Pulses: Normal pulses.     Heart sounds: Normal heart sounds. No murmur heard. Pulmonary:     Effort: Pulmonary effort is normal.     Breath sounds: Normal breath sounds.  Abdominal:     General: Abdomen is flat. Bowel sounds are normal.     Palpations: Abdomen is soft.  Musculoskeletal:        General: No swelling.     Cervical back: Neck supple.  Skin:    General:  Skin is warm.  Neurological:     General: No focal deficit present.     Mental Status: She is alert and oriented to person, place, and time.  Psychiatric:        Mood and Affect: Mood normal.        Thought Content: Thought content normal.     Labs reviewed: Basic Metabolic Panel: Recent Labs    02/02/22 0810 09/21/22  0810  NA 140 142  K 4.0 4.5  CL 102 105  CO2 32 29  GLUCOSE 74 83  BUN 23 21  CREATININE 1.05* 1.10*  CALCIUM 10.3 9.9  TSH 1.66 1.17   Liver Function Tests: Recent Labs    02/02/22 0810 09/21/22 0810  AST 26 33  ALT 13 11  BILITOT 0.6 0.6  PROT 7.0 6.7   No results for input(s): "LIPASE", "AMYLASE" in the last 8760 hours. No results for input(s): "AMMONIA" in the last 8760 hours. CBC: Recent Labs    02/02/22 0810 09/21/22 0810  WBC 7.5 6.9  NEUTROABS 3,428 3,347  HGB 14.9 13.4  HCT 43.3 39.6  MCV 92.5 93.4  PLT 179 181   Lipid Panel: Recent Labs    02/02/22 0810 09/21/22 0810  CHOL 191 191  HDL 85 82  LDLCALC 89 93  TRIG 83 74  CHOLHDL 2.2 2.3   No results found for: "HGBA1C"  Procedures since last visit: No results found.  Assessment/Plan 1. Essential hypertension Continue Norvasc  - TSH; Future - COMPLETE METABOLIC PANEL WITH GFR; Future - CBC with Differential/Platelet; Future  2. Hyperlipidemia, unspecified hyperlipidemia type Statin and Lovaza  - TSH; Future - Lipid panel; Future  3. Osteoporosis, postmenopausal On Prolia  4. Stage 1 chronic kidney disease Creat stable Avoid NSAIDS Drink water  Other issues . Cervical stenosis of spine Last Xray in 2015 Moderate foraminal narrowing at C6-7 bilaterally  .     Bilateral carotid artery stenosis Follows with Cardiology    Vitamin D deficiency Levels good    Meningioma [facility]) S/p Surgery    Memory deficit Last MMSE 30/30  Highly functional   . Allergic rhinitis, unspecified seasonality, unspecified trigger Flonase PRn  Labs/tests ordered:  * No  order type specified * Next appt:  01/22/2023

## 2022-11-13 ENCOUNTER — Ambulatory Visit
Admission: RE | Admit: 2022-11-13 | Discharge: 2022-11-13 | Disposition: A | Discharge status: Home / Self Care | Payer: Medicare HMO | Source: Ambulatory Visit | Attending: Internal Medicine | Admitting: Internal Medicine

## 2022-11-13 DIAGNOSIS — Z1231 Encounter for screening mammogram for malignant neoplasm of breast: Secondary | ICD-10-CM

## 2023-01-09 DIAGNOSIS — Z947 Corneal transplant status: Secondary | ICD-10-CM | POA: Insufficient documentation

## 2023-01-09 DIAGNOSIS — H02403 Unspecified ptosis of bilateral eyelids: Secondary | ICD-10-CM | POA: Insufficient documentation

## 2023-01-10 DIAGNOSIS — H353131 Nonexudative age-related macular degeneration, bilateral, early dry stage: Secondary | ICD-10-CM | POA: Insufficient documentation

## 2023-01-22 ENCOUNTER — Non-Acute Institutional Stay (INDEPENDENT_AMBULATORY_CARE_PROVIDER_SITE_OTHER): Payer: Medicare HMO | Admitting: Orthopedic Surgery

## 2023-01-22 ENCOUNTER — Encounter: Payer: Self-pay | Admitting: Orthopedic Surgery

## 2023-01-22 VITALS — BP 110/70 | HR 70 | Temp 96.7°F | Resp 16 | Ht 61.0 in | Wt 121.2 lb

## 2023-01-22 DIAGNOSIS — I2510 Atherosclerotic heart disease of native coronary artery without angina pectoris: Secondary | ICD-10-CM | POA: Diagnosis not present

## 2023-01-22 DIAGNOSIS — Z0000 Encounter for general adult medical examination without abnormal findings: Secondary | ICD-10-CM | POA: Diagnosis not present

## 2023-01-22 DIAGNOSIS — I10 Essential (primary) hypertension: Secondary | ICD-10-CM

## 2023-01-22 DIAGNOSIS — E785 Hyperlipidemia, unspecified: Secondary | ICD-10-CM

## 2023-01-22 MED ORDER — AMLODIPINE BESYLATE 5 MG PO TABS: 5.0000 mg | ORAL_TABLET | Freq: Every day | ORAL | 3 refills | Status: DC

## 2023-01-22 MED ORDER — PRAVASTATIN SODIUM 80 MG PO TABS: 80.0000 mg | ORAL_TABLET | Freq: Every day | ORAL | 1 refills | Status: DC

## 2023-01-22 MED ORDER — COLESEVELAM HCL 625 MG PO TABS
1875.0000 mg | ORAL_TABLET | Freq: Two times a day (BID) | ORAL | 3 refills | Status: DC
Start: 2023-01-22 — End: 2024-08-04

## 2023-01-22 NOTE — Progress Notes (Signed)
Subjective:   Yolanda [name] is a 88 y.o. female who presents for Medicare Annual (Subsequent) preventive examination.  Place of Service: [facility] Provider: Hazle Nordmann, AGNP-C   Review of Systems     Cardiac Risk Factors include: advanced age (>84men, >10 women);dyslipidemia     Objective:    Today's Vitals   01/22/23 1421 01/22/23 1430 01/22/23 1432  BP: 110/70    Pulse: 70    Resp: 16    Temp: (!) 96.7 F (35.9 C)    SpO2: 96%    Weight: 121 lb 3.2 oz (55 kg)    Height: 5\' 1"  (1.549 m)    PainSc:  0-No pain 0-No pain   Body mass index is 22.9 kg/m.     01/22/2023    2:20 PM 10/11/2022    3:43 PM 04/03/2022    1:18 PM 02/08/2022    2:15 PM 01/16/2022    9:46 AM 08/29/2021   10:10 AM 01/29/2019    1:24 PM  Advanced Directives  Does Patient Have a Medical Advance Directive? Yes Yes Yes Yes Yes Yes Yes  Type of Estate agent of [place];Living will;Out of facility DNR (pink MOST or yellow form) Healthcare Power of [place];Living will;Out of facility DNR (pink MOST or yellow form) Healthcare Power of [place];Living will Healthcare Power of [place];Living will Healthcare Power of [place];Living will Healthcare Power of [place];Living will Healthcare Power of [place];Living will  Does patient want to make changes to medical advance directive? No - Patient declined  No - Patient declined No - Patient declined No - Patient declined No - Patient declined No - Patient declined  Copy of Healthcare Power of Attorney in Chart? Yes - validated most recent copy scanned in chart (See row information)  Yes - validated most recent copy scanned in chart (See row information) Yes - validated most recent copy scanned in chart (See row information) Yes - validated most recent copy scanned in chart (See row information) Yes - validated most recent copy scanned in chart (See row information) Yes - validated most recent copy scanned in chart (See row  information)    Current Medications (verified) Outpatient Encounter Medications as of 01/22/2023  Medication Sig   amLODipine (NORVASC) 5 MG tablet Take 1 tablet (5 mg total) by mouth daily.   aspirin EC 81 MG tablet Take 81 mg by mouth daily.   Cholecalciferol (VITAMIN D3) 1000 UNITS CAPS Take 2,000 Units by mouth daily.   co-enzyme Q-10 30 MG capsule Take 1 capsule (30 mg total) by mouth daily.   colesevelam (WELCHOL) 625 MG tablet Take 3 tablets (1,875 mg total) by mouth 2 (two) times daily with a meal.   denosumab (PROLIA) 60 MG/ML SOSY injection Inject 60 mg into the skin every 6 (six) months. In prefilled syringes.   diclofenac Sodium (VOLTAREN) 1 % GEL Apply 2 g topically 4 (four) times daily.   estradiol (ESTRACE VAGINAL) 0.1 MG/GM vaginal cream Place 1 Applicatorful vaginally 3 (three) times a week.   fluticasone (FLONASE) 50 MCG/ACT nasal spray Place 2 sprays into both nostrils daily.   L-Lysine 1000 MG TABS Take 1,000 mg by mouth daily.   loratadine (CLARITIN) 10 MG tablet Take 10 mg by mouth as needed for allergies.   Multiple Vitamin (MULTI-VITAMIN PO) Take 1 tablet by mouth daily.   omega-3 acid ethyl esters (LOVAZA) 1 g capsule TAKE 2 CAPSULES TWICE DAILY   polyethylene glycol powder (GLYCOLAX/MIRALAX) powder Take 17 g by  mouth daily as needed for moderate constipation.   pravastatin (PRAVACHOL) 80 MG tablet TAKE 1 TABLET DAILY   Probiotic Product (PROBIOTIC PO) Take 1 capsule by mouth daily. While on antibiotic1   Facility-Administered Encounter Medications as of 01/22/2023  Medication   nystatin cream (MYCOSTATIN)    Allergies (verified) Sulfonamide derivatives, K+ care [potassium chloride], Lipitor [atorvastatin calcium], and Sulfa antibiotics   History: Past Medical History:  Diagnosis Date   Allergy    seasonal   Anemia    Arthritis    Benign neoplasm of descending colon 10/22/2014   CAD (coronary artery disease)    LAD lession    Cancer    skin   Cataract     removed bilaterally   Diverticulosis 11/00   Esophagitis, acute    Fuch's endothelial dystrophy 04/2009   Dr, Katrina Stack    Gastritis 11/00   GERD (gastroesophageal reflux disease) 11/00   Hemorrhoids 8/05   Hyperlipidemia    Hypertension    Lung nodule 1/09   NSVD (normal spontaneous vaginal delivery) 1967   female    Osteopenia    Osteoporosis    Thyroid nodule 8/08   Past Surgical History:  Procedure Laterality Date   bilateral cataract surgery  06/2007   DR. Groat    bilateral cataract surgery  10/08   Dr. Dione Booze    cardiac cath 40-50%LAD  6/07   cardiac cath with angioplasty & stent replacement  4/98   COLONOSCOPY     CRANIOTOMY Left 05/13/2015   Procedure: Left frontotemporal Craniotomy for Meningioma resection;  Surgeon: Lisbeth Renshaw, MD;  Location: [facility];  Service: Neurosurgery;  Laterality: Left;   CT LUNG SCREENING  6/07   FACIAL COSMETIC SURGERY  7/96   Dr. Leodis Binet     MENISCUS REPAIR     Family History  Problem Relation Age of Onset   Breast cancer Sister 75       lumpectomy   Heart disease Sister    Stroke Sister    GER disease Sister    Dementia Sister    Hepatitis Brother    Heart attack Brother    Stroke Brother    Diabetes Mother    Hypertension Mother    Diabetes Father    Hypertension Father    Alzheimer's disease Sister    Colon cancer Neg Hx    Social History   Socioeconomic History   Marital status: Married    Spouse name: Charles   Number of children: 1   Years of education: 4   Highest education level: Not on file  Occupational History   Occupation: Retired Runner, broadcasting/film/video  Tobacco Use   Smoking status: Former    Packs/day: 0.50    Years: 27.00    Additional pack years: 0.00    Total pack years: 13.50    Types: Cigarettes   Smokeless tobacco: Never  Vaping Use   Vaping Use: Never used  Substance and Sexual Activity   Alcohol use: No    Alcohol/week: 0.0 standard drinks of alcohol   Drug use: No   Sexual activity: Never   Other Topics Concern   Not on file  Social History Narrative   Married to [place] in 1961   Lives in [address] [facility]   One child, Clydie Braun   No pets   Caffeine   Exercises (water aerobics, walking, chair)   Living Will, HCPOA, DNR   Glasses   Hearing Aids   Social Determinants of  Health   Financial Resource Strain: Low Risk  (01/22/2023)   Overall Financial Resource Strain (CARDIA)    Difficulty of Paying Living Expenses: Not hard at all  Food Insecurity: No Food Insecurity (01/22/2023)   Hunger Vital Sign    Worried About Running Out of Food in the Last Year: Never true    Ran Out of Food in the Last Year: Never true  Transportation Needs: No Transportation Needs (01/16/2022)   PRAPARE - Administrator, Civil Service (Medical): No    Lack of Transportation (Non-Medical): No  Physical Activity: Sufficiently Active (01/22/2023)   Exercise Vital Sign    Days of Exercise per Week: 7 days    Minutes of Exercise per Session: 60 min  Stress: No Stress Concern Present (01/22/2023)   [employer] of Occupational Health - Occupational Stress Questionnaire    Feeling of Stress : Only a little  Social Connections: Moderately Integrated (01/22/2023)   Social Connection and Isolation Panel [NHANES]    Frequency of Communication with Friends and Family: Three times a week    Frequency of Social Gatherings with Friends and Family: More than three times a week    Attends Religious Services: More than 4 times per year    Active Member of [employer] or Organizations: No    Attends Engineer, structural: Never    Marital Status: Married    Tobacco Counseling Counseling given: Not Answered   Clinical Intake:  Pre-visit preparation completed: No  Pain : No/denies pain Pain Score: 0-No pain     BMI - recorded: 22.9 Nutritional Status: BMI of 19-24  Normal Nutritional Risks: None Diabetes: No  How often do you need to have someone help you  when you read instructions, pamphlets, or other written materials from your doctor or pharmacy?: 2 - Rarely What is the last grade level you completed in school?: [place]  Diabetic?No  Interpreter Needed?: No      Activities of Daily Living    01/22/2023    2:38 PM  In your present state of health, do you have any difficulty performing the following activities:  Hearing? 0  Vision? 0  Difficulty concentrating or making decisions? 0  Walking or climbing stairs? 0  Doing errands, shopping? 0  Preparing Food and eating ? N  Using the Toilet? N  In the past six months, have you accidently leaked urine? Y  Do you have problems with loss of bowel control? N  Managing your Medications? N  Managing your Finances? N  Housekeeping or managing your Housekeeping? N    Patient Care Team: Mahlon Gammon, MD as PCP - General (Internal Medicine) Rollene Rotunda, MD as PCP - Cardiology (Cardiology) Rollene Rotunda, MD (Cardiology) Ernesto Rutherford, MD (Ophthalmology) Louis Meckel, MD (Inactive) as Consulting Physician (Gastroenterology)  Indicate any recent Medical Services you may have received from other than [facility] providers in the past year (date may be approximate).     Assessment:   This is a routine wellness examination for Paislynn.  Hearing/Vision screen Hearing Screening - Comments:: No hearing concerns. Patient wears hearing aids.  Vision Screening - Comments:: No vision concerns. Patient wears prescription glasses. Patient last eye exam April 10,2024  Dietary issues and exercise activities discussed: Current Exercise Habits: Structured exercise class, Type of exercise: strength training/weights;stretching;walking, Time (Minutes): 60, Frequency (Times/Week): 7, Weekly Exercise (Minutes/Week): 420, Intensity: Moderate, Exercise limited by: None identified   Goals Addressed  This Visit's Progress    Maintain Mobility and Function   On track    Evidence-based  guidance:  Emphasize the importance of physical activity and aerobic exercise as included in treatment plan; assess barriers to adherence; consider patient's abilities and preferences.  Encourage gradual increase in activity or exercise instead of stopping if pain occurs.  Reinforce individual therapy exercise prescription, such as strengthening, stabilization and stretching programs.  Promote optimal body mechanics to stabilize the spine with lifting and functional activity.  Encourage activity and mobility modifications to facilitate optimal function, such as using a log roll for bed mobility or dressing from a seated position.  Reinforce individual adaptive equipment recommendations to limit excessive spinal movements, such as a Event organiser.  Assess adequacy of sleep; encourage use of sleep hygiene techniques, such as bedtime routine; use of white noise; dark, cool bedroom; avoiding daytime naps, heavy meals or exercise before bedtime.  Promote positions and modification to optimize sleep and sexual activity; consider pillows or positioning devices to assist in maintaining neutral spine.  Explore options for applying ergonomic principles at work and home, such as frequent position changes, using ergonomically designed equipment and working at optimal height.  Promote modifications to increase comfort with driving such as lumbar support, optimizing seat and steering wheel position, using cruise control and taking frequent rest stops to stretch and walk.   Notes:        Depression Screen    01/22/2023    2:19 PM 10/11/2022    3:43 PM 01/16/2022    2:27 PM 02/01/2018    1:00 PM 05/02/2017    9:26 AM 12/20/2016   10:40 AM 07/25/2016    8:33 AM  PHQ 2/9 Scores  PHQ - 2 Score 0 0 0 0 0 0 0    Fall Risk    01/22/2023    2:37 PM 01/22/2023    2:18 PM 10/11/2022    3:43 PM 04/03/2022    1:18 PM 02/08/2022    2:14 PM  Fall Risk   Falls in the past year? 0 0 0 0 0  Number falls in past yr:  0 0 0 0 0  Injury with Fall? 0 0 0 0 0  Risk for fall due to : History of fall(s) No Fall Risks History of fall(s) No Fall Risks No Fall Risks  Follow up Falls evaluation completed;Education provided;Falls prevention discussed Falls evaluation completed Falls evaluation completed Falls evaluation completed Falls evaluation completed    FALL RISK PREVENTION PERTAINING TO THE HOME:  Any stairs in or around the home? No  If so, are there any without handrails? No  Home free of loose throw rugs in walkways, pet beds, electrical cords, etc? No  Adequate lighting in your home to reduce risk of falls? Yes   ASSISTIVE DEVICES UTILIZED TO PREVENT FALLS:  Life alert? No  Use of a cane, walker or w/c? No  Grab bars in the bathroom? Yes  Shower chair or bench in shower? Yes  Elevated toilet seat or a handicapped toilet? Yes   TIMED UP AND GO:  Was the test performed? No .  Length of time to ambulate 10 feet: N/A sec.   Gait slow and steady without use of assistive device  Cognitive Function:    01/22/2023    2:21 PM 01/16/2022    2:27 PM 01/28/2020    1:47 PM 02/01/2018    1:26 PM 12/15/2015   12:39 PM  MMSE - Mini Mental State Exam  Orientation to time 5 4 5 5 5   Orientation to Place 5 5 5 5 5   Registration 3 3 3 3 3   Attention/ Calculation 5 5 5 5 5   Recall 3 3 3 2 3   Language- name 2 objects 2 2 2 2 2   Language- repeat 1 1 1 1 1   Language- follow 3 step command 2 3 3 3 3   Language- read & follow direction 1 1 1 1 1   Write a sentence 1 1 1 1 1   Copy design 1 1 1 1 1   Total score 29 29 30 29 30         Immunizations Immunization History  Administered Date(s) Administered   Fluad Quad(high Dose 65+) 07/19/2022   Influenza, High Dose Seasonal PF 07/12/2015, 06/27/2016, 07/02/2017, 06/29/2019, 07/07/2021   Influenza-Unspecified 07/02/2017, 06/22/2020, 07/07/2021   Moderna Sars-Covid-2 Vaccination 10/06/2019, 11/03/2019, 08/08/2022   PFIZER(Purple Top)SARS-COV-2 Vaccination  07/29/2020, 03/09/2021   Pfizer Covid-19 Vaccine Bivalent Booster 78yrs & up 06/22/2021   Pneumococcal Conjugate-13 08/21/2013   Pneumococcal Polysaccharide-23 08/02/2021   Tdap 08/02/2021   Zoster Recombinat (Shingrix) 04/18/2018, 07/22/2018    TDAP status: Up to date  Flu Vaccine status: Up to date  Pneumococcal vaccine status: Up to date  Covid-19 vaccine status: Completed vaccines  Qualifies for Shingles Vaccine? Yes   Zostavax completed No   Shingrix Completed?: Yes  Screening Tests Health Maintenance  Topic Date Due   COVID-19 Vaccine (7 - 2023-24 season) 10/03/2022   INFLUENZA VACCINE  05/03/2023   DEXA SCAN  09/09/2023   MAMMOGRAM  11/14/2023   Medicare Annual Wellness (AWV)  01/22/2024   DTaP/Tdap/Td (2 - Td or Tdap) 08/03/2031   Pneumonia Vaccine 28+ Years old  Completed   Zoster Vaccines- Shingrix  Completed   HPV VACCINES  Aged Out    Health Maintenance  Health Maintenance Due  Topic Date Due   COVID-19 Vaccine (7 - 2023-24 season) 10/03/2022    Colorectal cancer screening: No longer required.   Mammogram status: Completed 11/15/2022. Repeat every year  Bone Density status: Completed 2022. Results reflect: Bone density results: OSTEOPOROSIS. Repeat every 2 years.  Lung Cancer Screening: (Low Dose CT Chest recommended if Age 13-80 years, 30 pack-year currently smoking OR have quit w/in 15years.) does not qualify.   Lung Cancer Screening Referral: No  Additional Screening:  Hepatitis C Screening: does not qualify; Completed   Vision Screening: Recommended annual ophthalmology exams for early detection of glaucoma and other disorders of the eye. Is the patient up to date with their annual eye exam?  Yes  Who is the provider or what is the name of the office in which the patient attends annual eye exams? Dr. Valere Dross If pt is not established with a provider, would they like to be referred to a provider to establish care? No .   Dental Screening:  Recommended annual dental exams for proper oral hygiene  Community Resource Referral / Chronic Care Management: CRR required this visit?  No   CCM required this visit?  No      Plan:     I have personally reviewed and noted the following in the patient's chart:   Medical and social history Use of alcohol, tobacco or illicit drugs  Current medications and supplements including opioid prescriptions. Patient is not currently taking opioid prescriptions. Functional ability and status Nutritional status Physical activity Advanced directives List of other physicians Hospitalizations, surgeries, and ER visits in previous 12 months Vitals Screenings to include cognitive, depression,  and falls Referrals and appointments  In addition, I have reviewed and discussed with patient certain preventive protocols, quality metrics, and best practice recommendations. A written personalized care plan for preventive services as well as general preventive health recommendations were provided to patient.     Octavia Heir, NP   01/22/2023   Nurse Notes: Recommend yearly mammogram, plan to have DEXA done 11/2023 with mammogram

## 2023-02-19 ENCOUNTER — Telehealth: Payer: Self-pay | Admitting: *Deleted

## 2023-02-19 NOTE — Telephone Encounter (Signed)
Called [facility] [phone] and spoke with Ike Bene and Prior Authorization for Prolia was APPROVED 02/19/2023-02/19/2024  Auth#: [id number]

## 2023-03-09 ENCOUNTER — Ambulatory Visit (INDEPENDENT_AMBULATORY_CARE_PROVIDER_SITE_OTHER): Payer: Medicare HMO

## 2023-03-09 DIAGNOSIS — M810 Age-related osteoporosis without current pathological fracture: Secondary | ICD-10-CM | POA: Diagnosis not present

## 2023-03-09 MED ORDER — DENOSUMAB 60 MG/ML ~~LOC~~ SOSY
60.0000 mg | PREFILLED_SYRINGE | Freq: Once | SUBCUTANEOUS | Status: AC
Start: 2023-03-09 — End: 2023-03-09
  Administered 2023-03-09: 60 mg via SUBCUTANEOUS

## 2023-03-20 ENCOUNTER — Other Ambulatory Visit: Payer: Self-pay

## 2023-03-20 DIAGNOSIS — J302 Other seasonal allergic rhinitis: Secondary | ICD-10-CM

## 2023-03-20 DIAGNOSIS — I10 Essential (primary) hypertension: Secondary | ICD-10-CM

## 2023-03-20 MED ORDER — FLUTICASONE PROPIONATE 50 MCG/ACT NA SUSP
2.0000 | Freq: Every day | NASAL | 11 refills | Status: DC
Start: 2023-03-20 — End: 2023-08-15

## 2023-03-20 MED ORDER — AMLODIPINE BESYLATE 5 MG PO TABS
5.0000 mg | ORAL_TABLET | Freq: Every day | ORAL | 3 refills | Status: DC
Start: 2023-03-20 — End: 2024-02-11

## 2023-04-11 ENCOUNTER — Encounter: Payer: Self-pay | Admitting: Internal Medicine

## 2023-04-11 ENCOUNTER — Non-Acute Institutional Stay: Payer: Medicare HMO | Admitting: Internal Medicine

## 2023-04-11 VITALS — BP 128/76 | HR 73 | Temp 97.7°F | Resp 17 | Ht 61.0 in | Wt 118.7 lb

## 2023-04-11 DIAGNOSIS — H18519 Endothelial corneal dystrophy, unspecified eye: Secondary | ICD-10-CM | POA: Insufficient documentation

## 2023-04-11 DIAGNOSIS — I10 Essential (primary) hypertension: Secondary | ICD-10-CM | POA: Diagnosis not present

## 2023-04-11 DIAGNOSIS — E785 Hyperlipidemia, unspecified: Secondary | ICD-10-CM

## 2023-04-11 DIAGNOSIS — N181 Chronic kidney disease, stage 1: Secondary | ICD-10-CM | POA: Diagnosis not present

## 2023-04-11 DIAGNOSIS — M810 Age-related osteoporosis without current pathological fracture: Secondary | ICD-10-CM | POA: Diagnosis not present

## 2023-04-11 DIAGNOSIS — Z961 Presence of intraocular lens: Secondary | ICD-10-CM | POA: Insufficient documentation

## 2023-04-12 NOTE — Progress Notes (Signed)
Location:  [facility] Service:  Clinic (12)  Provider:  Code Status:  Goals of Care:     04/11/2023   10:55 AM  Advanced Directives  Does Patient Have a Medical Advance Directive? Yes  Type of Estate agent of [place];Living will;Out of facility DNR (pink MOST or yellow form)  Does patient want to make changes to medical advance directive? No - Patient declined  Copy of Healthcare Power of Attorney in Chart? Yes - validated most recent copy scanned in chart (See row information)     Chief Complaint  Patient presents with   Medical Management of Chronic Issues    Patient states she is here for 6 month follow up and wants to discuss some brown spots    HPI: Patient is a 88 y.o. female seen today for medical management of chronic diseases.   Lives in [facility] with her husband   Patient has a history of coronary artery disease, hypertension, hyperlipidemia, thyroid nodule, osteoporosis and allergic rhinitis, corneal dystrophy s/p Corneal Transplant,GERD S/p craniotomy for meningioma in 2016   No New issues Doing well Walks every day Very active Cognitively doing well too   Past Medical History:  Diagnosis Date   Allergy    seasonal   Anemia    Arthritis    Benign neoplasm of descending colon 10/22/2014   CAD (coronary artery disease)    LAD lession    Cancer (HCC)    skin   Cataract    removed bilaterally   Diverticulosis 11/00   Esophagitis, acute    Fuch's endothelial dystrophy 04/2009   Dr, Katrina Stack    Gastritis 11/00   GERD (gastroesophageal reflux disease) 11/00   Hemorrhoids 8/05   Hyperlipidemia    Hypertension    Lung nodule 1/09   NSVD (normal spontaneous vaginal delivery) 1967   female    Osteopenia    Osteoporosis    Thyroid nodule 8/08    Past Surgical History:  Procedure Laterality Date   bilateral cataract surgery  06/2007   DR. Groat    bilateral cataract surgery  10/08   Dr. Dione Booze    cardiac cath  40-50%LAD  6/07   cardiac cath with angioplasty & stent replacement  4/98   COLONOSCOPY     CRANIOTOMY Left 05/13/2015   Procedure: Left frontotemporal Craniotomy for Meningioma resection;  Surgeon: Lisbeth Renshaw, MD;  Location: [facility];  Service: Neurosurgery;  Laterality: Left;   CT LUNG SCREENING  6/07   FACIAL COSMETIC SURGERY  7/96   Dr. Leodis Binet     MENISCUS REPAIR      Allergies  Allergen Reactions   Sulfonamide Derivatives    K+ Care [Potassium Chloride] Diarrhea   Lipitor [Atorvastatin Calcium]     Myalgias     Sulfa Antibiotics Rash    Fever blisters    Outpatient Encounter Medications as of 04/11/2023  Medication Sig   amLODipine (NORVASC) 5 MG tablet Take 1 tablet (5 mg total) by mouth daily.   aspirin EC 81 MG tablet Take 81 mg by mouth daily.   Cholecalciferol (VITAMIN D3) 1000 UNITS CAPS Take 2,000 Units by mouth daily.   co-enzyme Q-10 30 MG capsule Take 1 capsule (30 mg total) by mouth daily.   colesevelam (WELCHOL) 625 MG tablet Take 3 tablets (1,875 mg total) by mouth 2 (two) times daily with a meal.   denosumab (PROLIA) 60 MG/ML SOSY injection Inject 60 mg into the skin  every 6 (six) months. In prefilled syringes.   diclofenac Sodium (VOLTAREN) 1 % GEL Apply 2 g topically 4 (four) times daily.   estradiol (ESTRACE VAGINAL) 0.1 MG/GM vaginal cream Place 1 Applicatorful vaginally 3 (three) times a week.   fluticasone (FLONASE) 50 MCG/ACT nasal spray Place 2 sprays into both nostrils daily.   L-Lysine 1000 MG TABS Take 1,000 mg by mouth daily.   loratadine (CLARITIN) 10 MG tablet Take 10 mg by mouth as needed for allergies.   Multiple Vitamin (MULTI-VITAMIN PO) Take 1 tablet by mouth daily.   omega-3 acid ethyl esters (LOVAZA) 1 g capsule TAKE 2 CAPSULES TWICE DAILY   polyethylene glycol powder (GLYCOLAX/MIRALAX) powder Take 17 g by mouth daily as needed for moderate constipation.   pravastatin (PRAVACHOL) 80 MG tablet Take 1 tablet (80 mg total) by mouth  daily.   Probiotic Product (PROBIOTIC PO) Take 1 capsule by mouth daily. While on antibiotic1   Facility-Administered Encounter Medications as of 04/11/2023  Medication   nystatin cream (MYCOSTATIN)    Review of Systems:  Review of Systems  Constitutional:  Negative for activity change and appetite change.  HENT: Negative.    Respiratory:  Negative for cough and shortness of breath.   Cardiovascular:  Negative for leg swelling.  Gastrointestinal:  Negative for constipation.  Genitourinary: Negative.   Musculoskeletal:  Negative for arthralgias, gait problem and myalgias.  Skin: Negative.   Neurological:  Negative for dizziness and weakness.  Psychiatric/Behavioral:  Negative for confusion, dysphoric mood and sleep disturbance.     Health Maintenance  Topic Date Due   COVID-19 Vaccine (7 - 2023-24 season) 10/03/2022   INFLUENZA VACCINE  05/03/2023   DEXA SCAN  09/09/2023   MAMMOGRAM  11/14/2023   Medicare Annual Wellness (AWV)  01/22/2024   DTaP/Tdap/Td (2 - Td or Tdap) 08/03/2031   Pneumonia Vaccine 54+ Years old  Completed   Zoster Vaccines- Shingrix  Completed   HPV VACCINES  Aged Out    Physical Exam: Vitals:   04/11/23 1053  BP: 128/76  Pulse: 73  Resp: 17  Temp: 97.7 F (36.5 C)  TempSrc: Temporal  SpO2: 95%  Weight: 118 lb 11.2 oz (53.8 kg)  Height: 5\' 1"  (1.549 m)   Body mass index is 22.43 kg/m. Physical Exam Vitals reviewed.  Constitutional:      Appearance: Normal appearance.  HENT:     Head: Normocephalic.     Nose: Nose normal.     Mouth/Throat:     Mouth: Mucous membranes are moist.     Pharynx: Oropharynx is clear.  Eyes:     Pupils: Pupils are equal, round, and reactive to light.  Cardiovascular:     Rate and Rhythm: Normal rate and regular rhythm.     Pulses: Normal pulses.     Heart sounds: Normal heart sounds. No murmur heard. Pulmonary:     Effort: Pulmonary effort is normal.     Breath sounds: Normal breath sounds.  Abdominal:      General: Abdomen is flat. Bowel sounds are normal.     Palpations: Abdomen is soft.  Musculoskeletal:        General: No swelling.     Cervical back: Neck supple.  Skin:    General: Skin is warm.  Neurological:     General: No focal deficit present.     Mental Status: She is alert and oriented to person, place, and time.  Psychiatric:        Mood and Affect:  Mood normal.        Thought Content: Thought content normal.     Labs reviewed: Basic Metabolic Panel: Recent Labs    09/21/22 0810  NA 142  K 4.5  CL 105  CO2 29  GLUCOSE 83  BUN 21  CREATININE 1.10*  CALCIUM 9.9  TSH 1.17   Liver Function Tests: Recent Labs    09/21/22 0810  AST 33  ALT 11  BILITOT 0.6  PROT 6.7   No results for input(s): "LIPASE", "AMYLASE" in the last 8760 hours. No results for input(s): "AMMONIA" in the last 8760 hours. CBC: Recent Labs    09/21/22 0810  WBC 6.9  NEUTROABS 3,347  HGB 13.4  HCT 39.6  MCV 93.4  PLT 181   Lipid Panel: Recent Labs    09/21/22 0810  CHOL 191  HDL 82  LDLCALC 93  TRIG 74  CHOLHDL 2.3   No results found for: "HGBA1C"  Procedures since last visit: No results found.  Assessment/Plan 1. Osteoporosis, postmenopausal Prolia per [facility] - TSH - Vitamin D, 25-hydroxy  2. Essential hypertension Doing well on Norvasc  - TSH - COMPLETE METABOLIC PANEL WITH GFR - CBC with Differential/Platelet  3. Hyperlipidemia, unspecified hyperlipidemia type Statin and Lovaza - Lipid panel  4. Stage 1 chronic kidney disease  Cervical stenosis of spine Last Xray in 2015 Moderate foraminal narrowing at C6-7 bilatera  Bilateral carotid artery stenosis Follows with Cardiology   Meningioma [facility]) S/p Surgery Does not need more follow ups by Neurosurgery    Memory deficit Last MMSE 30/30  Highly functional   . Allergic rhinitis, unspecified seasonality, unspecified trigger Flonase PRn   Labs/tests ordered:  * No order type specified * Next appt:   08/09/2023

## 2023-06-03 NOTE — Progress Notes (Unsigned)
  Cardiology Office Note:   Date:  06/06/2023  ID:  Yolanda [name], DOB October 13, 1934, MRN [id number] PCP: Mahlon Gammon, MD  [facility] Providers Cardiologist:  Rollene Rotunda, MD {  History of Present Illness:   TENNILE [name] is a 88 y.o. female who presents for follow up of CAD. At the last visit she had some chest pain.  I sent her for a POET (Plain Old Exercise Treadmill) and this was negaitve for ischemia.  Since the last time I saw the patient she has been well.  She had a stress test last year because she was having some discomfort walking up the hill and has not healed inversions on her EKG.  As below this has resolved and she had a negative POET (Plain Old Exercise Treadmill) .  She has no new symptoms yoga.  She is still walking at bedside and finding.  She does not have any more discomfort.  She denies chest pressure, neck or arm discomfort.  She has no weight gain or edema.  ROS: As stated in the HPI and negative for all other systems.  Studies Reviewed:    EKG:   EKG Interpretation Date/Time:  Wednesday June 06 2023 09:06:54 EDT Ventricular Rate:  66 PR Interval:  136 QRS Duration:  78 QT Interval:  398 QTC Calculation: 417 R Axis:   31  Text Interpretation: Normal sinus rhythm Normal ECG When compared with ECG of 11-Jun-2012 12:21, Premature atrial complexes are no longer Present Questionable change in QRS axis T wave inversion no longer evident in Anterior leads Confirmed by Rollene Rotunda ([id number]) on 06/06/2023 9:23:37 AM    Risk Assessment/Calculations:              Physical Exam:   VS:  BP 130/70 (BP Location: Right Arm, Patient Position: Sitting, Cuff Size: Normal)   Pulse 66   Ht 5\' 1"  (1.549 m)   Wt 117 lb 12.8 oz (53.4 kg)   SpO2 93%   BMI 22.26 kg/m    Wt Readings from Last 3 Encounters:  06/06/23 117 lb 12.8 oz (53.4 kg)  04/11/23 118 lb 11.2 oz (53.8 kg)  01/22/23 121 lb 3.2 oz (55 kg)     GEN: Well nourished, well developed in no acute  distress NECK: No JVD; No carotid bruits CARDIAC: RRR, no murmurs, rubs, gallops RESPIRATORY:  Clear to auscultation without rales, wheezing or rhonchi  ABDOMEN: Soft, non-tender, non-distended EXTREMITIES:  No edema; No deformity   ASSESSMENT AND PLAN:    CAD, UNSPECIFIED SITE -  The patient has no new sypmtoms.  No further cardiovascular testing is indicated.  We will continue with aggressive risk reduction and meds as listed.  HYPERTENSION, UNSPECIFIED -  The blood pressure is at target.  No change in therapy.    HYPERLIPIDEMIA-MIXED -  Her LDL is 93.  However, her HDL is 82.  I think this is a reasonable target.  No change in therapy.       Follow up me in 12 months.   Signed, Rollene Rotunda, MD

## 2023-06-06 ENCOUNTER — Ambulatory Visit: Payer: Medicare HMO | Attending: Cardiology | Admitting: Cardiology

## 2023-06-06 ENCOUNTER — Encounter: Payer: Self-pay | Admitting: Cardiology

## 2023-06-06 VITALS — BP 130/70 | HR 66 | Ht 61.0 in | Wt 117.8 lb

## 2023-06-06 DIAGNOSIS — I10 Essential (primary) hypertension: Secondary | ICD-10-CM

## 2023-06-06 DIAGNOSIS — E785 Hyperlipidemia, unspecified: Secondary | ICD-10-CM

## 2023-06-06 DIAGNOSIS — I2510 Atherosclerotic heart disease of native coronary artery without angina pectoris: Secondary | ICD-10-CM | POA: Diagnosis not present

## 2023-06-06 NOTE — Patient Instructions (Signed)
Medication Instructions:  Your physician recommends that you continue on your current medications as directed. Please refer to the Current Medication list given to you today.   *If you need a refill on your cardiac medications before your next appointment, please call your pharmacy*     Follow-Up: At [facility], you and your health needs are our priority.  As part of our continuing mission to provide you with exceptional heart care, we have created designated Provider Care Teams.  These Care Teams include your primary Cardiologist (physician) and Advanced Practice Providers (APPs -  Physician Assistants and Nurse Practitioners) who all work together to provide you with the care you need, when you need it.  We recommend signing up for the patient portal called "MyChart".  Sign up information is provided on this After Visit Summary.  MyChart is used to connect with patients for Virtual Visits (Telemedicine).  Patients are able to view lab/test results, encounter notes, upcoming appointments, etc.  Non-urgent messages can be sent to your provider as well.   To learn more about what you can do with MyChart, go to [email or web address].    Your next appointment:   1 year(s)  The format for your next appointment:   In Person  Provider:   Rollene Rotunda, MD

## 2023-06-11 ENCOUNTER — Other Ambulatory Visit: Payer: Self-pay | Admitting: Orthopedic Surgery

## 2023-06-11 DIAGNOSIS — E785 Hyperlipidemia, unspecified: Secondary | ICD-10-CM

## 2023-06-11 NOTE — Telephone Encounter (Signed)
Pharmacy requested refill.  °Pended Rx and sent to Dr. Gupta for approval due to HIGH ALERT Warning.  °

## 2023-07-16 ENCOUNTER — Other Ambulatory Visit: Payer: Self-pay | Admitting: *Deleted

## 2023-07-16 MED ORDER — OMEGA-3-ACID ETHYL ESTERS 1 G PO CAPS: 2.0000 | ORAL_CAPSULE | Freq: Two times a day (BID) | ORAL | 1 refills | Status: DC

## 2023-07-16 NOTE — Telephone Encounter (Signed)
Patient called requesting refill to be sent to [facility].

## 2023-08-09 ENCOUNTER — Other Ambulatory Visit: Payer: Medicare HMO

## 2023-08-10 LAB — COMPLETE METABOLIC PANEL WITH GFR
AG Ratio: 1.7 (calc) (ref 1.0–2.5)
ALT: 15 U/L (ref 6–29)
AST: 29 U/L (ref 10–35)
Albumin: 4.2 g/dL (ref 3.6–5.1)
Alkaline phosphatase (APISO): 45 U/L (ref 37–153)
BUN/Creatinine Ratio: 19 (calc) (ref 6–22)
BUN: 21 mg/dL (ref 7–25)
CO2: 29 mmol/L (ref 20–32)
Calcium: 9.9 mg/dL (ref 8.6–10.4)
Chloride: 106 mmol/L (ref 98–110)
Creat: 1.08 mg/dL — ABNORMAL HIGH (ref 0.60–0.95)
Globulin: 2.5 g/dL (ref 1.9–3.7)
Glucose, Bld: 83 mg/dL (ref 65–99)
Potassium: 4.2 mmol/L (ref 3.5–5.3)
Sodium: 140 mmol/L (ref 135–146)
Total Bilirubin: 0.6 mg/dL (ref 0.2–1.2)
Total Protein: 6.7 g/dL (ref 6.1–8.1)
eGFR: 50 mL/min/{1.73_m2} — ABNORMAL LOW (ref >=60)

## 2023-08-10 LAB — CBC WITH DIFFERENTIAL/PLATELET
Absolute Lymphocytes: 1222 {cells}/uL (ref 850–3900)
Absolute Monocytes: 663 {cells}/uL (ref 200–950)
Basophils Absolute: 39 {cells}/uL (ref 0–200)
Basophils Relative: 0.6 %
Eosinophils Absolute: 247 {cells}/uL (ref 15–500)
Eosinophils Relative: 3.8 %
HCT: 40.5 % (ref 35.0–45.0)
Hemoglobin: 13.3 g/dL (ref 11.7–15.5)
MCH: 31.4 pg (ref 27.0–33.0)
MCHC: 32.8 g/dL (ref 32.0–36.0)
MCV: 95.5 fL (ref 80.0–100.0)
MPV: 9.3 fL (ref 7.5–12.5)
Monocytes Relative: 10.2 %
Neutro Abs: 4329 {cells}/uL (ref 1500–7800)
Neutrophils Relative %: 66.6 %
Platelets: 173 10*3/uL (ref 140–400)
RBC: 4.24 10*6/uL (ref 3.80–5.10)
RDW: 11.7 % (ref 11.0–15.0)
Total Lymphocyte: 18.8 %
WBC: 6.5 10*3/uL (ref 3.8–10.8)

## 2023-08-10 LAB — LIPID PANEL
Cholesterol: 179 mg/dL (ref <200)
HDL: 76 mg/dL (ref >=50)
LDL Cholesterol (Calc): 87 mg/dL
Non-HDL Cholesterol (Calc): 103 mg/dL (ref <130)
Total CHOL/HDL Ratio: 2.4 (calc) (ref <5.0)
Triglycerides: 70 mg/dL (ref <150)

## 2023-08-10 LAB — TSH: TSH: 1.17 m[IU]/L (ref 0.40–4.50)

## 2023-08-10 LAB — VITAMIN D 25 HYDROXY (VIT D DEFICIENCY, FRACTURES): Vit D, 25-Hydroxy: 41 ng/mL (ref 30–100)

## 2023-08-15 ENCOUNTER — Encounter: Payer: Self-pay | Admitting: Internal Medicine

## 2023-08-15 ENCOUNTER — Non-Acute Institutional Stay: Payer: Medicare HMO | Admitting: Internal Medicine

## 2023-08-15 VITALS — BP 132/72 | HR 69 | Temp 98.5°F | Resp 17 | Ht 61.0 in | Wt 120.2 lb

## 2023-08-15 DIAGNOSIS — E785 Hyperlipidemia, unspecified: Secondary | ICD-10-CM

## 2023-08-15 DIAGNOSIS — N1831 Chronic kidney disease, stage 3a: Secondary | ICD-10-CM | POA: Diagnosis not present

## 2023-08-15 DIAGNOSIS — M810 Age-related osteoporosis without current pathological fracture: Secondary | ICD-10-CM | POA: Diagnosis not present

## 2023-08-15 DIAGNOSIS — I10 Essential (primary) hypertension: Secondary | ICD-10-CM | POA: Diagnosis not present

## 2023-08-15 DIAGNOSIS — H353131 Nonexudative age-related macular degeneration, bilateral, early dry stage: Secondary | ICD-10-CM

## 2023-08-15 MED ORDER — SULFACETAMIDE SODIUM-SULFUR 10-5 % EX LOTN: TOPICAL_LOTION | CUTANEOUS | 0 refills | Status: DC

## 2023-08-15 NOTE — Progress Notes (Signed)
Location:  [facility]:  Clinic (12)  Provider:   Code Status:  Goals of Care:     08/14/2023    4:48 PM  Advanced Directives  Does Patient Have a Medical Advance Directive? Yes  Type of Estate agent of [place];Living will  Does patient want to make changes to medical advance directive? No - Patient declined     Chief Complaint  Patient presents with   Medical Management of Chronic Issues    Patient is being seen for a follow up with labs. Patient would like to discuss spots on her face     HPI: Patient is a 88 y.o. female seen today for medical management of chronic diseases.   Lives in [facility] with her husband   Patient has a history of coronary artery disease, hypertension, hyperlipidemia, thyroid nodule, osteoporosis and allergic rhinitis, corneal dystrophy s/p Corneal Transplant,GERD S/p craniotomy for meningioma in 2016 New Diagnosis of Dry Macular Degeneration  No New Issues Worries about age spot on her face Also noticed some Mild edema in her legs   Past Medical History:  Diagnosis Date   Allergy    seasonal   Anemia    Arthritis    Benign neoplasm of descending colon 10/22/2014   CAD (coronary artery disease)    LAD lession    Cancer (HCC)    skin   Cataract    removed bilaterally   Diverticulosis 11/00   Esophagitis, acute    Fuch's endothelial dystrophy 04/2009   Dr, Katrina Stack    Gastritis 11/00   GERD (gastroesophageal reflux disease) 11/00   Hemorrhoids 8/05   Hyperlipidemia    Hypertension    Lung nodule 1/09   NSVD (normal spontaneous vaginal delivery) 1967   female    Osteopenia    Osteoporosis    Thyroid nodule 8/08    Past Surgical History:  Procedure Laterality Date   bilateral cataract surgery  06/2007   DR. Groat    bilateral cataract surgery  10/08   Dr. Dione Booze    cardiac cath 40-50%LAD  6/07   cardiac cath with angioplasty & stent replacement  4/98   COLONOSCOPY     CRANIOTOMY  Left 05/13/2015   Procedure: Left frontotemporal Craniotomy for Meningioma resection;  Surgeon: Lisbeth Renshaw, MD;  Location: [facility];  Service: Neurosurgery;  Laterality: Left;   CT LUNG SCREENING  6/07   FACIAL COSMETIC SURGERY  7/96   Dr. Leodis Binet     MENISCUS REPAIR      Allergies  Allergen Reactions   Sulfonamide Derivatives    K+ Care [Potassium Chloride] Diarrhea   Lipitor [Atorvastatin Calcium]     Myalgias     Sulfa Antibiotics Rash    Fever blisters    Outpatient Encounter Medications as of 08/15/2023  Medication Sig   Sulfacetamide Sodium-Sulfur 10-5 % LOTN Apply as needed   amLODipine (NORVASC) 5 MG tablet Take 1 tablet (5 mg total) by mouth daily.   aspirin EC 81 MG tablet Take 81 mg by mouth daily.   Cholecalciferol (VITAMIN D3) 1000 UNITS CAPS Take 2,000 Units by mouth daily.   co-enzyme Q-10 30 MG capsule Take 1 capsule (30 mg total) by mouth daily.   colesevelam (WELCHOL) 625 MG tablet Take 3 tablets (1,875 mg total) by mouth 2 (two) times daily with a meal.   denosumab (PROLIA) 60 MG/ML SOSY injection Inject 60 mg into the skin every 6 (six) months. In  prefilled syringes.   diclofenac Sodium (VOLTAREN) 1 % GEL Apply 2 g topically 4 (four) times daily.   estradiol (ESTRACE VAGINAL) 0.1 MG/GM vaginal cream Place 1 Applicatorful vaginally 3 (three) times a week.   fluticasone (FLONASE) 50 MCG/ACT nasal spray Place 2 sprays into both nostrils daily.   L-Lysine 1000 MG TABS Take 1,000 mg by mouth daily.   loratadine (CLARITIN) 10 MG tablet Take 10 mg by mouth as needed for allergies.   Multiple Vitamin (MULTI-VITAMIN PO) Take 1 tablet by mouth daily.   omega-3 acid ethyl esters (LOVAZA) 1 g capsule Take 2 capsules (2 g total) by mouth 2 (two) times daily.   polyethylene glycol powder (GLYCOLAX/MIRALAX) powder Take 17 g by mouth daily as needed for moderate constipation.   pravastatin (PRAVACHOL) 80 MG tablet TAKE 1 TABLET DAILY   Probiotic Product (PROBIOTIC  PO) Take 1 capsule by mouth daily. While on antibiotic1   Facility-Administered Encounter Medications as of 08/15/2023  Medication   nystatin cream (MYCOSTATIN)    Review of Systems:  Review of Systems  Constitutional:  Negative for activity change and appetite change.  HENT: Negative.    Respiratory:  Negative for cough and shortness of breath.   Cardiovascular:  Positive for leg swelling.  Gastrointestinal:  Positive for constipation.  Genitourinary: Negative.   Musculoskeletal:  Negative for arthralgias, gait problem and myalgias.  Skin: Negative.   Neurological:  Negative for dizziness and weakness.  Psychiatric/Behavioral:  Negative for confusion, dysphoric mood and sleep disturbance.     Health Maintenance  Topic Date Due   DEXA SCAN  09/09/2023   COVID-19 Vaccine (8 - 2023-24 season) 10/03/2023   MAMMOGRAM  11/14/2023   Medicare Annual Wellness (AWV)  01/22/2024   DTaP/Tdap/Td (2 - Td or Tdap) 08/03/2031   Pneumonia Vaccine 90+ Years old  Completed   INFLUENZA VACCINE  Completed   Zoster Vaccines- Shingrix  Completed   HPV VACCINES  Aged Out    Physical Exam: Vitals:   08/15/23 0958  BP: 132/72  Pulse: 69  Resp: 17  Temp: 98.5 F (36.9 C)  TempSrc: Temporal  SpO2: 91%  Weight: 120 lb 3.2 oz (54.5 kg)  Height: 5\' 1"  (1.549 m)   Body mass index is 22.71 kg/m. Physical Exam Vitals reviewed.  Constitutional:      Appearance: Normal appearance.  HENT:     Head: Normocephalic.     Nose: Nose normal.     Mouth/Throat:     Mouth: Mucous membranes are moist.     Pharynx: Oropharynx is clear.  Eyes:     Pupils: Pupils are equal, round, and reactive to light.  Cardiovascular:     Rate and Rhythm: Normal rate and regular rhythm.     Pulses: Normal pulses.     Heart sounds: Normal heart sounds. No murmur heard. Pulmonary:     Effort: Pulmonary effort is normal.     Breath sounds: Normal breath sounds.  Abdominal:     General: Abdomen is flat. Bowel  sounds are normal.     Palpations: Abdomen is soft.  Musculoskeletal:        General: No swelling.     Cervical back: Neck supple.     Comments: Mild Edema Bilateral  Skin:    General: Skin is warm.  Neurological:     General: No focal deficit present.     Mental Status: She is alert and oriented to person, place, and time.  Psychiatric:  Mood and Affect: Mood normal.        Thought Content: Thought content normal.     Labs reviewed: Basic Metabolic Panel: Recent Labs    09/21/22 0810 08/09/23 0810  NA 142 140  K 4.5 4.2  CL 105 106  CO2 29 29  GLUCOSE 83 83  BUN 21 21  CREATININE 1.10* 1.08*  CALCIUM 9.9 9.9  TSH 1.17 1.17   Liver Function Tests: Recent Labs    09/21/22 0810 08/09/23 0810  AST 33 29  ALT 11 15  BILITOT 0.6 0.6  PROT 6.7 6.7   No results for input(s): "LIPASE", "AMYLASE" in the last 8760 hours. No results for input(s): "AMMONIA" in the last 8760 hours. CBC: Recent Labs    09/21/22 0810 08/09/23 0810  WBC 6.9 6.5  NEUTROABS 3,347 4,329  HGB 13.4 13.3  HCT 39.6 40.5  MCV 93.4 95.5  PLT 181 173   Lipid Panel: Recent Labs    09/21/22 0810 08/09/23 0810  CHOL 191 179  HDL 82 76  LDLCALC 93 87  TRIG 74 70  CHOLHDL 2.3 2.4   No results found for: "HGBA1C"  Procedures since last visit: No results found.  Assessment/Plan 1. Essential hypertension Controlled on Norvasc Is having Mild edema in her Legs at the end of that day  2. Osteoporosis, postmenopausal Prolia  Vit D  3. Hyperlipidemia, unspecified hyperlipidemia type On Statin and Lovaza  4. Stage 3a chronic kidney disease (HCC) Stable  5. Early dry stage nonexudative age-related macular degeneration of both eyes AREDS  Cervical stenosis of spine Last Xray in 2015 Moderate foraminal narrowing at C6-7 bilateral CAD Follows with Cardiology Aspirin and Statin  Meningioma [facility]) S/p Surgery Does not need more follow ups by Neurosurgery   Memory  deficit Last MMSE 30/30  Highly functional   . Allergic rhinitis, unspecified seasonality, unspecified trigger Flonase PRn  Labs/tests ordered:   Next appt:  09/10/2023

## 2023-08-16 ENCOUNTER — Telehealth: Payer: Self-pay | Admitting: *Deleted

## 2023-08-16 ENCOUNTER — Other Ambulatory Visit: Payer: Self-pay | Admitting: Internal Medicine

## 2023-08-16 DIAGNOSIS — M810 Age-related osteoporosis without current pathological fracture: Secondary | ICD-10-CM

## 2023-08-16 MED ORDER — DENOSUMAB 60 MG/ML ~~LOC~~ SOSY
60.0000 mg | PREFILLED_SYRINGE | Freq: Once | SUBCUTANEOUS | Status: AC
  Administered 2023-09-10: 60 mg via SUBCUTANEOUS

## 2023-08-16 NOTE — Addendum Note (Signed)
Addended by: Nelda Severe A on: 08/16/2023 09:54 AM   Modules accepted: Orders

## 2023-08-16 NOTE — Telephone Encounter (Signed)
[employer] Verified. No Copay, Needs PA  [facility] [phone] and spoke with Ike Bene and Prior Authorization for Prolia was APPROVED 02/19/2023-02/19/2024   Auth#: [id number]  Prolia Appt: 09/10/2023 Workque completed and appointment assigned.

## 2023-09-10 ENCOUNTER — Ambulatory Visit: Payer: Medicare HMO | Admitting: *Deleted

## 2023-09-10 DIAGNOSIS — M810 Age-related osteoporosis without current pathological fracture: Secondary | ICD-10-CM | POA: Diagnosis not present

## 2023-09-10 MED ORDER — DENOSUMAB 60 MG/ML ~~LOC~~ SOSY
60.0000 mg | PREFILLED_SYRINGE | Freq: Once | SUBCUTANEOUS | Status: AC
  Administered 2024-03-13: 60 mg via SUBCUTANEOUS

## 2023-10-04 ENCOUNTER — Other Ambulatory Visit: Payer: Self-pay | Admitting: Internal Medicine

## 2023-10-04 DIAGNOSIS — Z1231 Encounter for screening mammogram for malignant neoplasm of breast: Secondary | ICD-10-CM

## 2023-11-19 ENCOUNTER — Ambulatory Visit
Admission: RE | Admit: 2023-11-19 | Discharge: 2023-11-19 | Disposition: A | Discharge status: Home / Self Care | Payer: Medicare HMO | Source: Ambulatory Visit | Attending: Internal Medicine | Admitting: Internal Medicine

## 2023-11-19 DIAGNOSIS — Z1231 Encounter for screening mammogram for malignant neoplasm of breast: Secondary | ICD-10-CM

## 2024-02-07 ENCOUNTER — Other Ambulatory Visit: Payer: Medicare HMO

## 2024-02-11 ENCOUNTER — Other Ambulatory Visit: Payer: Self-pay | Admitting: Internal Medicine

## 2024-02-11 DIAGNOSIS — E785 Hyperlipidemia, unspecified: Secondary | ICD-10-CM

## 2024-02-11 DIAGNOSIS — I10 Essential (primary) hypertension: Secondary | ICD-10-CM

## 2024-02-11 MED ORDER — AMLODIPINE BESYLATE 5 MG PO TABS: 5.0000 mg | ORAL_TABLET | Freq: Every day | ORAL | 1 refills | Status: DC

## 2024-02-11 MED ORDER — PRAVASTATIN SODIUM 80 MG PO TABS: 80.0000 mg | ORAL_TABLET | Freq: Every day | ORAL | 1 refills | Status: DC

## 2024-02-11 NOTE — Telephone Encounter (Signed)
 High risk or very high risk warning populated when attempting to refill medication. RX request sent to PCP for review and approval if warranted.

## 2024-02-11 NOTE — Telephone Encounter (Signed)
 Copied from CRM [id number]. Topic: Clinical - Medication Refill >> Feb 11, 2024  9:38 AM Shelby Dessert H wrote: Medication: amLODipine  (NORVASC ) 5 MG tablet, pravastatin  (PRAVACHOL ) 80 MG tablet  Has the patient contacted their pharmacy? Yes (Agent: If no, request that the patient contact the pharmacy for the refill. If patient does not wish to contact the pharmacy document the reason why and proceed with request.) (Agent: If yes, when and what did the pharmacy advise?)  This is the patient's preferred pharmacy:  [facility] [place], [place] - [address] [facility] [address] Portal to [facility] [address] [place] [address] Phone: [phone] Fax: [phone]  Is this the correct pharmacy for this prescription? Yes If no, delete pharmacy and type the correct one.   Has the prescription been filled recently? No  Is the patient out of the medication? No  Has the patient been seen for an appointment in the last year OR does the patient have an upcoming appointment? Yes  Can we respond through MyChart? Yes  Agent: Please be advised that Rx refills may take up to 3 business days. We ask that you follow-up with your pharmacy.

## 2024-02-13 ENCOUNTER — Encounter: Payer: Self-pay | Admitting: Internal Medicine

## 2024-02-13 ENCOUNTER — Non-Acute Institutional Stay: Payer: Medicare HMO | Admitting: Internal Medicine

## 2024-02-13 VITALS — BP 140/90 | HR 80 | Temp 97.0°F | Resp 16 | Ht 61.0 in | Wt 120.1 lb

## 2024-02-13 DIAGNOSIS — M810 Age-related osteoporosis without current pathological fracture: Secondary | ICD-10-CM

## 2024-02-13 DIAGNOSIS — T148XXA Other injury of unspecified body region, initial encounter: Secondary | ICD-10-CM | POA: Diagnosis not present

## 2024-02-13 DIAGNOSIS — I10 Essential (primary) hypertension: Secondary | ICD-10-CM | POA: Diagnosis not present

## 2024-02-13 DIAGNOSIS — E785 Hyperlipidemia, unspecified: Secondary | ICD-10-CM

## 2024-02-13 DIAGNOSIS — H353131 Nonexudative age-related macular degeneration, bilateral, early dry stage: Secondary | ICD-10-CM

## 2024-02-13 DIAGNOSIS — N1831 Chronic kidney disease, stage 3a: Secondary | ICD-10-CM

## 2024-02-13 DIAGNOSIS — W19XXXA Unspecified fall, initial encounter: Secondary | ICD-10-CM

## 2024-02-13 NOTE — Patient Instructions (Addendum)
 Please come to [facility] Monday 02/18/2024 at 7:45am for labs.

## 2024-02-13 NOTE — Progress Notes (Signed)
 Location:  [facility] Service:  Clinic (12)  Provider:   Code Status:  Goals of Care:     02/13/2024   Yolanda [name]:09 AM  Advanced Directives  Does Patient Have a Medical Advance Directive? Yes  Type of Estate agent of [place];Living will;Out of facility DNR (pink MOST or yellow form)  Does patient want to make changes to medical advance directive? No - Patient declined  Copy of Healthcare Power of Attorney in Chart? Yes - validated most recent copy scanned in chart (See row information)     Chief Complaint  Patient presents with   Medical Management of Chronic Issues    6 month follow up. Discuss the need for Covid Booster, Dexa scan, and AWV.   Concern     Patient complains of having fall Sunday morning 5/Yolanda [name]/2025. She states that she didn't have appointment today and nobody told her.    HPI: Patient is a 89 y.o. [name] seen today for medical management of chronic diseases.   Lives in [facility] with her husband   Patient has a history of coronary artery disease, hypertension, hyperlipidemia, thyroid  nodule, osteoporosis and allergic rhinitis, corneal dystrophy s/p Corneal Transplant,GERD S/p craniotomy for meningioma in 2016 New Diagnosis of Dry Macular Degeneration  Acute issue Fall She fell on Weekend Hit the curb around in the facility She hit her face And has Bruise on her Right Eye Denies any pain or any change in her Vision She denies any Dizziness no chest pain  No Headaches  Has been doing her exercises Mild edema in her Leg     Past Medical History:  Diagnosis Date   Allergy    seasonal   Anemia    Arthritis    Benign neoplasm of descending colon 10/22/2014   CAD (coronary artery disease)    LAD lession    Cancer (HCC)    skin   Cataract    removed bilaterally   Diverticulosis Yolanda [name]/00   Esophagitis, acute    Fuch's endothelial dystrophy 04/2009   Dr, Langston Pippins    Gastritis Yolanda [name]/00   GERD (gastroesophageal reflux disease)  Yolanda [name]/00   Hemorrhoids 8/05   Hyperlipidemia    Hypertension    Lung nodule 1/09   NSVD (normal spontaneous vaginal delivery) 1967   [name]    Osteopenia    Osteoporosis    Thyroid  nodule 8/08    Past Surgical History:  Procedure Laterality Date   bilateral cataract surgery  06/2007   DR. Groat    bilateral cataract surgery  10/08   Dr. Candi Chafe    cardiac cath 40-50%LAD  6/07   cardiac cath with angioplasty & stent replacement  4/98   COLONOSCOPY     CRANIOTOMY Left 8/Yolanda [name]/2016   Procedure: Left frontotemporal Craniotomy for Meningioma resection;  Surgeon: Augusto Blonder, MD;  Location: [facility];  Service: Neurosurgery;  Laterality: Left;   CT LUNG SCREENING  6/07   FACIAL COSMETIC SURGERY  7/96   Dr. Beau Bound     MENISCUS REPAIR      Allergies  Allergen Reactions   Sulfonamide Derivatives    K+ Care [Potassium Chloride ] Diarrhea   Lipitor [Atorvastatin Calcium ]     Myalgias     Sulfa Antibiotics Rash    Fever blisters    Outpatient Encounter Medications as of 02/13/2024  Medication Sig   amLODipine  (NORVASC ) 5 MG tablet Take 1 tablet (5 mg total) by mouth daily.   aspirin  EC 81 MG  tablet Take 81 mg by mouth daily.   Cholecalciferol (VITAMIN D3) 1000 UNITS CAPS Take 2,000 Units by mouth daily.   co-enzyme Q-10 30 MG capsule Take 1 capsule (30 mg total) by mouth daily.   colesevelam  (WELCHOL ) 625 MG tablet Take 3 tablets (1,875 mg total) by mouth 2 (two) times daily with a meal.   denosumab  (PROLIA ) 60 MG/ML SOSY injection Inject 60 mg into the skin every 6 (six) months. In prefilled syringes.   diclofenac  Sodium (VOLTAREN ) 1 % GEL Apply 2 g topically 4 (four) times daily.   Imiquimod (ALDARA EX) Apply topically. PATIENT HASN'T PICKED UP YET   L-Lysine 1000 MG TABS Take 1,000 mg by mouth daily.   loratadine  (CLARITIN ) 10 MG tablet Take 10 mg by mouth as needed for allergies.   Multiple Vitamin (MULTI-VITAMIN PO) Take 1 tablet by mouth daily.   Multiple  Vitamins-Minerals (PRESERVISION AREDS 2 PO) Take 1 capsule by mouth 2 (two) times daily.   omega-3 acid ethyl esters (LOVAZA ) 1 g capsule Take 2 capsules (2 g total) by mouth 2 (two) times daily.   polyethylene glycol powder (GLYCOLAX /MIRALAX ) powder Take 17 g by mouth daily as needed for moderate constipation.   pravastatin  (PRAVACHOL ) 80 MG tablet Take 1 tablet (80 mg total) by mouth daily.   Probiotic Product (PROBIOTIC PO) Take 1 capsule by mouth daily. While on antibiotic1   estradiol  (ESTRACE  VAGINAL) 0.1 MG/GM vaginal cream Place 1 Applicatorful vaginally 3 (three) times a week. (Patient not taking: Reported on 02/13/2024)   Sulfacetamide  Sodium-Sulfur  10-5 % LOTN APPLY AS NEEDED. (Patient not taking: Reported on 02/13/2024)   Facility-Administered Encounter Medications as of 02/13/2024  Medication   denosumab  (PROLIA ) injection 60 mg    Review of Systems:  Review of Systems  Constitutional:  Negative for activity change and appetite change.  HENT: Negative.    Respiratory:  Negative for cough and shortness of breath.   Cardiovascular:  Negative for leg swelling.  Gastrointestinal:  Negative for constipation.  Genitourinary: Negative.   Musculoskeletal:  Negative for arthralgias, gait problem and myalgias.  Skin: Negative.   Neurological:  Negative for dizziness and weakness.  Psychiatric/Behavioral:  Negative for confusion, dysphoric mood and sleep disturbance.     Health Maintenance  Topic Date Due   DEXA SCAN  09/09/2023   COVID-19 Vaccine (8 - 2024-25 season) 10/03/2023   Medicare Annual Wellness (AWV)  01/22/2024   INFLUENZA VACCINE  05/02/2024   MAMMOGRAM  11/18/2024   DTaP/Tdap/Td (2 - Td or Tdap) Yolanda [name]/10/2030   Pneumonia Vaccine 15+ Years old  Completed   Zoster Vaccines- Shingrix  Completed   HPV VACCINES  Aged Out   Meningococcal B Vaccine  Aged Out    Physical Exam: Vitals:   02/13/24 1102  BP: (!) 140/90  Pulse: 80  Resp: 16  Temp: (!) 97 F (36.1 C)   SpO2: 96%  Weight: 120 lb 1.6 oz (54.5 kg)  Height: 5\' 1"  (1.549 m)   Body mass index is 22.69 kg/m. Physical Exam Vitals reviewed.  Constitutional:      Appearance: Normal appearance.  HENT:     Head: Normocephalic.     Comments: Bruise on around her Right Eye Eye was not red No Restriction of motion Vision is good  No Pain     Nose: Nose normal.     Mouth/Throat:     Mouth: Mucous membranes are moist.     Pharynx: Oropharynx is clear.  Eyes:     Pupils:  Pupils are equal, round, and reactive to light.  Cardiovascular:     Rate and Rhythm: Normal rate and regular rhythm.     Pulses: Normal pulses.     Heart sounds: Normal heart sounds. No murmur heard. Pulmonary:     Effort: Pulmonary effort is normal.     Breath sounds: Normal breath sounds.  Abdominal:     General: Abdomen is flat. Bowel sounds are normal.     Palpations: Abdomen is soft.  Musculoskeletal:        General: No swelling.     Cervical back: Neck supple.  Skin:    General: Skin is warm.  Neurological:     General: No focal deficit present.     Mental Status: She is alert and oriented to person, place, and time.  Psychiatric:        Mood and Affect: Mood normal.        Thought Content: Thought content normal.     Labs reviewed: Basic Metabolic Panel: Recent Labs    Yolanda [name]/07/24 0810  NA 140  K 4.2  CL 106  CO2 29  GLUCOSE 83  BUN 21  CREATININE 1.08*  CALCIUM  9.9  TSH 1.17   Liver Function Tests: Recent Labs    Yolanda [name]/07/24 0810  AST 29  ALT 15  BILITOT 0.6  PROT 6.7   No results for input(s): "LIPASE", "AMYLASE" in the last 8760 hours. No results for input(s): "AMMONIA" in the last 8760 hours. CBC: Recent Labs    Yolanda [name]/07/24 0810  WBC 6.5  NEUTROABS 4,329  HGB 13.3  HCT 40.5  MCV 95.5  PLT 173   Lipid Panel: Recent Labs    Yolanda [name]/07/24 0810  CHOL 179  HDL 76  LDLCALC 87  TRIG 70  CHOLHDL 2.4   No results found for: "HGBA1C"  Procedures since last visit: No results  found.  Assessment/Plan 1. Fall, initial encounter (Primary) Gait stable  Mechanical 2. Bruise   3. Osteoporosis, postmenopausal Prolia  - COMPLETE METABOLIC PANEL WITHOUT GFR  4. Essential hypertension Norvasc  - CBC with Differential/Platelet  5. Hyperlipidemia, unspecified hyperlipidemia type Statin and Lovaza  - Lipid panel  6. Stage 3a chronic kidney disease (HCC)   7. Early dry stage nonexudative age-related macular degeneration of both eyes  Cervical stenosis of spine Last Xray in 2015 Moderate foraminal narrowing at C6-7 bilateral CAD Follows with Cardiology Aspirin  and Statin   Meningioma [facility]) S/p Surgery Does not need more follow ups by Neurosurgery   Memory deficit Last MMSE 30/30  Highly functional   . Allergic rhinitis, unspecified seasonality, unspecified trigger Flonase  PRn   Labs/tests ordered:  * No order type specified * Next appt:  03/13/2024

## 2024-02-18 ENCOUNTER — Ambulatory Visit: Payer: Self-pay

## 2024-02-18 ENCOUNTER — Other Ambulatory Visit: Payer: Self-pay | Admitting: Internal Medicine

## 2024-02-18 LAB — CBC WITH DIFFERENTIAL/PLATELET
Absolute Lymphocytes: 3112 {cells}/uL (ref 850–3900)
Absolute Monocytes: 704 {cells}/uL (ref 200–950)
Basophils Absolute: 41 {cells}/uL (ref 0–200)
Basophils Relative: 0.6 %
Eosinophils Absolute: 269 {cells}/uL (ref 15–500)
Eosinophils Relative: 3.9 %
HCT: 39.4 % (ref 35.0–45.0)
Hemoglobin: 13 g/dL (ref 11.7–15.5)
MCH: 31.3 pg (ref 27.0–33.0)
MCHC: 33 g/dL (ref 32.0–36.0)
MCV: 94.9 fL (ref 80.0–100.0)
MPV: 9.6 fL (ref 7.5–12.5)
Monocytes Relative: 10.2 %
Neutro Abs: 2774 {cells}/uL (ref 1500–7800)
Neutrophils Relative %: 40.2 %
Platelets: 167 10*3/uL (ref 140–400)
RBC: 4.15 10*6/uL (ref 3.80–5.10)
RDW: 12.4 % (ref 11.0–15.0)
Total Lymphocyte: 45.1 %
WBC: 6.9 10*3/uL (ref 3.8–10.8)

## 2024-02-18 LAB — LIPID PANEL
Cholesterol: 176 mg/dL (ref <200)
HDL: 68 mg/dL (ref >=50)
LDL Cholesterol (Calc): 92 mg/dL
Non-HDL Cholesterol (Calc): 108 mg/dL (ref <130)
Total CHOL/HDL Ratio: 2.6 (calc) (ref <5.0)
Triglycerides: 74 mg/dL (ref <150)

## 2024-02-18 LAB — COMPLETE METABOLIC PANEL WITHOUT GFR
AG Ratio: 1.6 (calc) (ref 1.0–2.5)
ALT: 14 U/L (ref 6–29)
AST: 26 U/L (ref 10–35)
Albumin: 4.1 g/dL (ref 3.6–5.1)
Alkaline phosphatase (APISO): 42 U/L (ref 37–153)
BUN/Creatinine Ratio: 22 (calc) (ref 6–22)
BUN: 25 mg/dL (ref 7–25)
CO2: 28 mmol/L (ref 20–32)
Calcium: 9.5 mg/dL (ref 8.6–10.4)
Chloride: 107 mmol/L (ref 98–110)
Creat: 1.15 mg/dL — ABNORMAL HIGH (ref 0.60–0.95)
Globulin: 2.5 g/dL (ref 1.9–3.7)
Glucose, Bld: 84 mg/dL (ref 65–99)
Potassium: 4.3 mmol/L (ref 3.5–5.3)
Sodium: 142 mmol/L (ref 135–146)
Total Bilirubin: 0.4 mg/dL (ref 0.2–1.2)
Total Protein: 6.6 g/dL (ref 6.1–8.1)

## 2024-02-18 MED ORDER — OMEGA-3-ACID ETHYL ESTERS 1 G PO CAPS: 2.0000 | ORAL_CAPSULE | Freq: Two times a day (BID) | ORAL | 1 refills | Status: AC

## 2024-02-18 NOTE — Telephone Encounter (Signed)
 Copied from CRM [id number]. Topic: Clinical - Medication Refill >> Feb 18, 2024  9:45 AM Yolanda [name] F wrote: Patient called in her prescription but stated that she is currently out of the medication listed and has been taking her husband's to supplement. Please assist.   Medication: omega-3 acid ethyl esters (LOVAZA ) 1 g capsule   Has the patient contacted their pharmacy? No (Agent: If no, request that the patient contact the pharmacy for the refill. If patient does not wish to contact the pharmacy document the reason why and proceed with request.) (Agent: If yes, when and what did the pharmacy advise?)  This is the patient's preferred pharmacy:  [facility] - [place], [place] - [facility] AT Portal to Registered Caremark Sites [address] [place] [address] Phone: [phone] Fax: [phone]   Is this the correct pharmacy for this prescription? Yes   Has the prescription been filled recently? No  Is the patient out of the medication? Yes  Has the patient been seen for an appointment in the last year OR does the patient have an upcoming appointment? Yes  Can we respond through MyChart? Yes  Agent: Please be advised that Rx refills may take up to 3 business days. We ask that you follow-up with your pharmacy.

## 2024-02-18 NOTE — Telephone Encounter (Signed)
  Chief Complaint: tick bite Symptoms: tick bite, redness Frequency: x 1 wek Pertinent Negatives: Patient denies fever Disposition: [] ED /[] Urgent Care (no appt availability in office) / [x] Appointment(In office/virtual)/ []  [place] [facility]/ [] Home Care/ [] Refused Recommended Disposition /[] [place] [facility]/ []  Follow-up with PCP Additional Notes: pt states that she thinks a tick bite her. States it was a little black speck a week ago and her husband tried to remove it and it then turned red. States the area is a little bigger than silver dollar. States are is not painful except where they probed.   Copied from CRM [id number]. Topic: Clinical - Red Word Triage >> Feb 18, 2024 12:10 PM Tisa Forester wrote: Red Word that prompted transfer to Nurse Triage: have a bite on left leg possible by a tick bite , Its been  over a week ago and its still red Reason for Disposition  [1] Scab is present AND [2] it drains pus or increases in size AND [3] not improved after applying antibiotic ointment for 2 days  Protocols used: Tick Bite-A-AH

## 2024-02-18 NOTE — Telephone Encounter (Signed)
 Last Fill: 07/16/23  Last OV: 02/13/24 Next OV: 02/20/24  Routing to provider for review/authorization.

## 2024-02-19 ENCOUNTER — Telehealth: Payer: Self-pay

## 2024-02-19 NOTE — Telephone Encounter (Signed)
 Incoming fax received from patients pharmacy to initiate a prior authorization for                   .  PA initiated through covermymeds. Key: [id number]  Awaiting reply from the insurance company which will be determined in 48-72 hours.

## 2024-02-20 ENCOUNTER — Encounter: Admitting: Internal Medicine

## 2024-02-20 NOTE — Telephone Encounter (Signed)
 Coverage denied, see denial letter under media and please advise

## 2024-02-22 ENCOUNTER — Ambulatory Visit: Payer: Self-pay | Admitting: Internal Medicine

## 2024-02-27 ENCOUNTER — Telehealth: Payer: Self-pay | Admitting: *Deleted

## 2024-02-27 NOTE — Telephone Encounter (Signed)
 Verification Submitted through [employer]. No Copay and Requires PA.   Duanne Gess [phone]  for patient's Prolia  Authorization and spoke with Rafe Bunde. Prolia  was APPROVED 02/27/24-02/26/25 Auth#: [id number]

## 2024-03-13 ENCOUNTER — Ambulatory Visit (INDEPENDENT_AMBULATORY_CARE_PROVIDER_SITE_OTHER): Payer: Medicare HMO | Admitting: Orthopedic Surgery

## 2024-03-13 DIAGNOSIS — M810 Age-related osteoporosis without current pathological fracture: Secondary | ICD-10-CM | POA: Diagnosis not present

## 2024-03-13 MED ORDER — DENOSUMAB 60 MG/ML ~~LOC~~ SOSY
60.0000 mg | PREFILLED_SYRINGE | Freq: Once | SUBCUTANEOUS | Status: AC
  Administered 2024-09-15: 09:00:00 60 mg via SUBCUTANEOUS

## 2024-03-13 NOTE — Progress Notes (Signed)
 Patient is in office today for a nurse visit for  prolia  injection . Patient Injection was given in the  Left lower quad. abdomen. Patient tolerated injection well.

## 2024-06-18 ENCOUNTER — Telehealth: Payer: Self-pay

## 2024-06-18 DIAGNOSIS — M810 Age-related osteoporosis without current pathological fracture: Secondary | ICD-10-CM

## 2024-06-18 DIAGNOSIS — N1831 Chronic kidney disease, stage 3a: Secondary | ICD-10-CM

## 2024-06-18 DIAGNOSIS — I10 Essential (primary) hypertension: Secondary | ICD-10-CM

## 2024-06-18 DIAGNOSIS — E785 Hyperlipidemia, unspecified: Secondary | ICD-10-CM

## 2024-06-18 NOTE — Telephone Encounter (Signed)
 Copied from CRM [id number]. Topic: Appointments - Scheduling Inquiry for Clinic >> Jun 18, 2024  3:53 PM Miquel SAILOR wrote: Reason for CRM: Patient has app on 11/19 will like to schedule labs 1 week before the 11/19. Needs call back to schedule. No morning app available.PT only waists morning due to fasting. [phone]

## 2024-06-18 NOTE — Telephone Encounter (Signed)
 Does patient need fasting labs before next scheduled appointment? Message routed to PCP Charlanne Fredia CROME, MD for clarification.

## 2024-06-20 NOTE — Addendum Note (Signed)
 Addended by: Franchesca Veneziano LALA on: 06/20/2024 10:42 AM   Modules accepted: Orders

## 2024-06-20 NOTE — Telephone Encounter (Signed)
 Tried calling patient to schedule lab appointment at [facility] to return call.

## 2024-06-20 NOTE — Telephone Encounter (Signed)
 I am Putting Orders for her Schedule her for labs one week before her Appointment

## 2024-06-23 NOTE — Telephone Encounter (Signed)
 Message routed to Admin staff to call patient and notify her of labs. She can go to [facility] clinic Monday 08/18/2024 at 7:45AM to have labs completed prior to appointment 08/20/2024.

## 2024-06-24 NOTE — Telephone Encounter (Signed)
 Tried calling patient to schedule lab appointment at [facility] to return call.

## 2024-08-04 ENCOUNTER — Other Ambulatory Visit: Payer: Self-pay | Admitting: Internal Medicine

## 2024-08-04 DIAGNOSIS — E785 Hyperlipidemia, unspecified: Secondary | ICD-10-CM

## 2024-08-04 DIAGNOSIS — I2510 Atherosclerotic heart disease of native coronary artery without angina pectoris: Secondary | ICD-10-CM

## 2024-08-04 DIAGNOSIS — I10 Essential (primary) hypertension: Secondary | ICD-10-CM

## 2024-08-04 MED ORDER — FLUTICASONE PROPIONATE 50 MCG/ACT NA SUSP: 2.0000 | Freq: Every day | NASAL | 6 refills | Status: AC

## 2024-08-04 MED ORDER — AMLODIPINE BESYLATE 5 MG PO TABS: 5.0000 mg | ORAL_TABLET | Freq: Every day | ORAL | 1 refills | Status: AC

## 2024-08-04 MED ORDER — COLESEVELAM HCL 625 MG PO TABS: 1875.0000 mg | ORAL_TABLET | Freq: Two times a day (BID) | ORAL | 3 refills | Status: AC

## 2024-08-04 MED ORDER — PRAVASTATIN SODIUM 80 MG PO TABS: 80.0000 mg | ORAL_TABLET | Freq: Every day | ORAL | 1 refills | Status: AC

## 2024-08-04 NOTE — Telephone Encounter (Signed)
 Copied from CRM [id number]. Topic: Clinical - Medication Refill >> Aug 04, 2024  2:34 PM Fredrica W wrote: Medication:  colesevelam  (WELCHOL ) 625 MG tablet pravastatin  (PRAVACHOL ) 80 MG tablet fluticasone  (FLONASE ) 50 MCG/ACT nasal spray amLODipine  (NORVASC ) 5 MG tablet   Has the patient contacted their pharmacy? No (Agent: If no, request that the patient contact the pharmacy for the refill. If patient does not wish to contact the pharmacy document the reason why and proceed with request.) (Agent: If yes, when and what did the pharmacy advise?) always calls provider  This is the patient's preferred pharmacy:  [facility] - [place], [place] - [facility] AT Portal to Registered Caremark Sites [address] [place] [address] Phone: [phone] Fax: [phone]   Is this the correct pharmacy for this prescription? Yes If no, delete pharmacy and type the correct one.   Has the prescription been filled recently? No  Is the patient out of the medication? No  Has the patient been seen for an appointment in the last year OR does the patient have an upcoming appointment? Yes  Can we respond through MyChart? No  Agent: Please be advised that Rx refills may take up to 3 business days. We ask that you follow-up with your pharmacy.   ----------------------------------------------------------------------- From previous Reason for Contact - Cancel/Reschedule: Patient/patient representative is calling to cancel or reschedule an appointment. Refer to attachments for appointment information.

## 2024-08-04 NOTE — Telephone Encounter (Signed)
 Flonase  not on current med list, unable to pend

## 2024-08-04 NOTE — Telephone Encounter (Signed)
 Patient is requesting nasal spray , no longer on pt ped list , sending to pcp for further review.

## 2024-08-18 LAB — COMPLETE METABOLIC PANEL WITHOUT GFR
AG Ratio: 1.5 (calc) (ref 1.0–2.5)
ALT: 10 U/L (ref 6–29)
AST: 22 U/L (ref 10–35)
Albumin: 3.9 g/dL (ref 3.6–5.1)
Alkaline phosphatase (APISO): 37 U/L (ref 37–153)
BUN/Creatinine Ratio: 19 (calc) (ref 6–22)
BUN: 21 mg/dL (ref 7–25)
CO2: 29 mmol/L (ref 20–32)
Calcium: 9.5 mg/dL (ref 8.6–10.4)
Chloride: 105 mmol/L (ref 98–110)
Creat: 1.12 mg/dL — ABNORMAL HIGH (ref 0.60–0.95)
Globulin: 2.6 g/dL (ref 1.9–3.7)
Glucose, Bld: 76 mg/dL (ref 65–99)
Potassium: 4.3 mmol/L (ref 3.5–5.3)
Sodium: 141 mmol/L (ref 135–146)
Total Bilirubin: 0.5 mg/dL (ref 0.2–1.2)
Total Protein: 6.5 g/dL (ref 6.1–8.1)

## 2024-08-18 LAB — CBC WITH DIFFERENTIAL/PLATELET
Absolute Lymphocytes: 2664 {cells}/uL (ref 850–3900)
Absolute Monocytes: 728 {cells}/uL (ref 200–950)
Basophils Absolute: 40 {cells}/uL (ref 0–200)
Basophils Relative: 0.5 %
Eosinophils Absolute: 264 {cells}/uL (ref 15–500)
Eosinophils Relative: 3.3 %
HCT: 39.9 % (ref 35.0–45.0)
Hemoglobin: 13.2 g/dL (ref 11.7–15.5)
MCH: 31.2 pg (ref 27.0–33.0)
MCHC: 33.1 g/dL (ref 32.0–36.0)
MCV: 94.3 fL (ref 80.0–100.0)
MPV: 9.7 fL (ref 7.5–12.5)
Monocytes Relative: 9.1 %
Neutro Abs: 4304 {cells}/uL (ref 1500–7800)
Neutrophils Relative %: 53.8 %
Platelets: 194 Thousand/uL (ref 140–400)
RBC: 4.23 Million/uL (ref 3.80–5.10)
RDW: 11.8 % (ref 11.0–15.0)
Total Lymphocyte: 33.3 %
WBC: 8 Thousand/uL (ref 3.8–10.8)

## 2024-08-18 LAB — LIPID PANEL
Cholesterol: 174 mg/dL (ref <200)
HDL: 69 mg/dL (ref >=50)
LDL Cholesterol (Calc): 88 mg/dL
Non-HDL Cholesterol (Calc): 105 mg/dL (ref <130)
Total CHOL/HDL Ratio: 2.5 (calc) (ref <5.0)
Triglycerides: 82 mg/dL (ref <150)

## 2024-08-18 LAB — TSH: TSH: 1.14 m[IU]/L (ref 0.40–4.50)

## 2024-08-20 ENCOUNTER — Encounter: Payer: Self-pay | Admitting: Internal Medicine

## 2024-08-21 ENCOUNTER — Ambulatory Visit: Payer: Self-pay | Admitting: Nurse Practitioner

## 2024-08-27 ENCOUNTER — Non-Acute Institutional Stay: Admitting: Internal Medicine

## 2024-08-27 ENCOUNTER — Encounter: Payer: Self-pay | Admitting: Internal Medicine

## 2024-08-27 ENCOUNTER — Telehealth: Payer: Self-pay | Admitting: *Deleted

## 2024-08-27 ENCOUNTER — Other Ambulatory Visit: Payer: Self-pay

## 2024-08-27 VITALS — BP 120/88 | HR 71 | Temp 96.7°F | Resp 18 | Ht 61.0 in | Wt 118.3 lb

## 2024-08-27 DIAGNOSIS — E785 Hyperlipidemia, unspecified: Secondary | ICD-10-CM | POA: Diagnosis not present

## 2024-08-27 DIAGNOSIS — I10 Essential (primary) hypertension: Secondary | ICD-10-CM | POA: Diagnosis not present

## 2024-08-27 DIAGNOSIS — M810 Age-related osteoporosis without current pathological fracture: Secondary | ICD-10-CM | POA: Diagnosis not present

## 2024-08-27 DIAGNOSIS — H353131 Nonexudative age-related macular degeneration, bilateral, early dry stage: Secondary | ICD-10-CM

## 2024-08-27 DIAGNOSIS — N1831 Chronic kidney disease, stage 3a: Secondary | ICD-10-CM

## 2024-08-27 MED ORDER — DENOSUMAB 60 MG/ML ~~LOC~~ SOSY
60.0000 mg | PREFILLED_SYRINGE | SUBCUTANEOUS | 1 refills | Status: DC
  Filled 2024-09-05: qty 1, 180d supply, fill #0

## 2024-08-27 NOTE — Telephone Encounter (Signed)
 Putman, Tiffany L, CPhT to Me (Selected Message)    08/27/24  3:17 PM Copay for Prolia  is $40 through Pharmacy

## 2024-08-27 NOTE — Telephone Encounter (Signed)
 Prolia  Verification submitted through [employer] and [employer].

## 2024-08-27 NOTE — Patient Instructions (Signed)
 Labs will be done on Feb 09, 2025 at [facility] at 7:45 am

## 2024-08-31 NOTE — Progress Notes (Signed)
 Location:   [facility] Developer of Service:  Clinic (12)  Provider:   Code Status:  Goals of Care:     02/13/2024   11:09 AM  Advanced Directives  Does Patient Have a Medical Advance Directive? Yes  Type of Estate Agent of [place];Living will;Out of facility DNR (pink MOST or yellow form)  Does patient want to make changes to medical advance directive? No - Patient declined  Copy of Healthcare Power of Attorney in Chart? Yes - validated most recent copy scanned in chart (See row information)     Chief Complaint  Patient presents with   Medical Management of Chronic Issues     6 month follow up New rx/Need Labs for Prolia     HPI: Patient is a 89 y.o. female seen today for medical management of chronic diseases.    Expand All Collapse All    Location:  [facility] [address] Place of Service:  Clinic (12)   Provider:    Code Status:  Goals of Care:      02/13/2024   11:09 AM  Advanced Directives  Does Patient Have a Medical Advance Directive? Yes  Type of Estate Agent of [place];Living will;Out of facility DNR (pink MOST or yellow form)  Does patient want to make changes to medical advance directive? No - Patient declined  Copy of Healthcare Power of Attorney in Chart? Yes - validated most recent copy scanned in chart (See row information)            Chief Complaint  Patient presents with   Medical Management of Chronic Issues      6 month follow up. Discuss the need for Covid Booster, Dexa scan, and AWV.   Concern       Patient complains of having fall Sunday morning 02/10/2024. She states that she didn't have appointment today and nobody told her.      HPI: Patient is a 89 y.o. female seen today for medical management of chronic diseases.   Lives in [facility] with her husband   Patient has a history of coronary artery disease, hypertension, hyperlipidemia, thyroid  nodule, osteoporosis and allergic rhinitis, corneal  dystrophy s/p Corneal Transplant,GERD S/p craniotomy for meningioma in 2016 New Diagnosis of Dry Macular Degeneration  Discussed the use of AI scribe software for clinical note transcription with the patient, who gave verbal consent to proceed.  History of Present Illness   Yolanda [name] is an 89 year old female who presents for a routine follow-up visit.  She asks whether she should take vitamin D2 instead of her current vitamin D3. Her vitamin D  level in November was 45 ng/mL. She has not had a DEXA scan since 2022 and continues Prolia  for osteoporosis.  She notes her HDL cholesterol is now 69 mg/dL and that she exercises regularly.  She reports previously elevated creatinine that is slightly improved on recent labs. She avoids NSAIDs such as ibuprofen and maintains hydration to protect kidney function.  She has chronic difficulty falling asleep due to racing thoughts and "rehashing" the day. Melatonin caused next-day grogginess and she stopped it.  She has a history of falls, with the last significant fall before the prior visit. She uses a cane on Sunday mornings when walking on uneven sidewalks.  She has heartburn relieved with Tums. She takes calcium  citrate for supplementation due to stomach issues and is cautious about total calcium  intake after being told she had a calcium  deposit on her eye.  She has macular degeneration and was advised by her eye specialist about possible calcium  deposits. She takes one calcium  citrate tablet daily and also consumes dairy products.          Past Medical History:  Diagnosis Date   Allergy    seasonal   Anemia    Arthritis    Benign neoplasm of descending colon 10/22/2014   CAD (coronary artery disease)    LAD lession    Cancer (HCC)    skin   Cataract    removed bilaterally   Diverticulosis 11/00   Esophagitis, acute    Fuch's endothelial dystrophy 04/2009   Dr, Loras    Gastritis 11/00   GERD (gastroesophageal reflux disease) 11/00    Hemorrhoids 8/05   Hyperlipidemia    Hypertension    Lung nodule 1/09   NSVD (normal spontaneous vaginal delivery) 1967   female    Osteopenia    Osteoporosis    Thyroid  nodule 8/08    Past Surgical History:  Procedure Laterality Date   bilateral cataract surgery  06/2007   DR. Groat    bilateral cataract surgery  10/08   Dr. Octavia    cardiac cath 40-50%LAD  6/07   cardiac cath with angioplasty & stent replacement  4/98   COLONOSCOPY     CRANIOTOMY Left 05/13/2015   Procedure: Left frontotemporal Craniotomy for Meningioma resection;  Surgeon: Gerldine Maizes, MD;  Location: [facility];  Service: Neurosurgery;  Laterality: Left;   CT LUNG SCREENING  6/07   FACIAL COSMETIC SURGERY  7/96   Dr. Cherylann     MENISCUS REPAIR      Allergies  Allergen Reactions   Sulfonamide Derivatives    K+ Care [Potassium Chloride ] Diarrhea   Lipitor [Atorvastatin Calcium ]     Myalgias     Sulfa Antibiotics Rash    Fever blisters    Outpatient Encounter Medications as of 08/27/2024  Medication Sig   amLODipine  (NORVASC ) 5 MG tablet Take 1 tablet (5 mg total) by mouth daily.   aspirin  EC 81 MG tablet Take 81 mg by mouth daily.   Cholecalciferol (VITAMIN D3) 1000 UNITS CAPS Take 2,000 Units by mouth daily.   co-enzyme Q-10 30 MG capsule Take 1 capsule (30 mg total) by mouth daily.   colesevelam  (WELCHOL ) 625 MG tablet Take 3 tablets (1,875 mg total) by mouth 2 (two) times daily with a meal.   denosumab  (PROLIA ) 60 MG/ML SOSY injection Inject 60 mg into the skin every 6 (six) months. In prefilled syringes.   diclofenac  Sodium (VOLTAREN ) 1 % GEL Apply 2 g topically 4 (four) times daily.   estradiol  (ESTRACE  VAGINAL) 0.1 MG/GM vaginal cream Place 1 Applicatorful vaginally 3 (three) times a week. (Patient taking differently: Place 1 Applicatorful vaginally as needed.)   fluticasone  (FLONASE ) 50 MCG/ACT nasal spray Place 2 sprays into both nostrils daily.   Imiquimod (ALDARA EX) Apply  topically. PATIENT HASN'T PICKED UP YET   L-Lysine 1000 MG TABS Take 1,000 mg by mouth daily.   loratadine  (CLARITIN ) 10 MG tablet Take 10 mg by mouth as needed for allergies.   Multiple Vitamin (MULTI-VITAMIN PO) Take 1 tablet by mouth daily.   Multiple Vitamins-Minerals (PRESERVISION AREDS 2 PO) Take 1 capsule by mouth 2 (two) times daily.   polyethylene glycol powder (GLYCOLAX /MIRALAX ) powder Take 17 g by mouth daily as needed for moderate constipation.   pravastatin  (PRAVACHOL ) 80 MG tablet Take 1 tablet (80 mg total) by mouth daily.   Probiotic  Product (PROBIOTIC PO) Take 1 capsule by mouth daily. While on antibiotic1   omega-3 acid ethyl esters (LOVAZA ) 1 g capsule Take 2 capsules (2 g total) by mouth 2 (two) times daily. (Patient not taking: Reported on 08/27/2024)   Facility-Administered Encounter Medications as of 08/27/2024  Medication   denosumab  (PROLIA ) injection 60 mg    Review of Systems:  Review of Systems  Constitutional:  Negative for activity change and appetite change.  HENT: Negative.    Respiratory:  Negative for cough and shortness of breath.   Cardiovascular:  Negative for leg swelling.  Gastrointestinal:  Negative for constipation.  Genitourinary: Negative.   Musculoskeletal:  Negative for arthralgias, gait problem and myalgias.  Skin: Negative.   Neurological:  Negative for dizziness and weakness.  Psychiatric/Behavioral:  Negative for confusion, dysphoric mood and sleep disturbance.     Health Maintenance  Topic Date Due   Bone Density Scan  09/09/2023   Medicare Annual Wellness (AWV)  01/22/2024   Mammogram  11/18/2024   COVID-19 Vaccine (9 - Mixed Product risk 2025-26 season) 01/12/2025   DTaP/Tdap/Td (2 - Td or Tdap) 08/03/2031   Pneumococcal Vaccine: 50+ Years  Completed   Influenza Vaccine  Completed   Zoster Vaccines- Shingrix  Completed   Meningococcal B Vaccine  Aged Out    Physical Exam: Vitals:   08/27/24 1118  BP: 120/88  Pulse: 71   Resp: 18  Temp: (!) 96.7 F (35.9 C)  SpO2: 98%  Weight: 118 lb 4.8 oz (53.7 kg)  Height: 5' 1 (1.549 m)   Body mass index is 22.35 kg/m. Physical Exam Vitals reviewed.  Constitutional:      Appearance: Normal appearance.  HENT:     Head: Normocephalic.     Nose: Nose normal.     Mouth/Throat:     Mouth: Mucous membranes are moist.     Pharynx: Oropharynx is clear.  Eyes:     Pupils: Pupils are equal, round, and reactive to light.  Cardiovascular:     Rate and Rhythm: Normal rate and regular rhythm.     Pulses: Normal pulses.     Heart sounds: Normal heart sounds. No murmur heard. Pulmonary:     Effort: Pulmonary effort is normal.     Breath sounds: Normal breath sounds.  Abdominal:     General: Abdomen is flat. Bowel sounds are normal.     Palpations: Abdomen is soft.  Musculoskeletal:        General: No swelling.     Cervical back: Neck supple.  Skin:    General: Skin is warm.  Neurological:     General: No focal deficit present.     Mental Status: She is alert and oriented to person, place, and time.  Psychiatric:        Mood and Affect: Mood normal.        Thought Content: Thought content normal.     Labs reviewed: Basic Metabolic Panel: Recent Labs    02/18/24 0752 08/18/24 0800  NA 142 141  K 4.3 4.3  CL 107 105  CO2 28 29  GLUCOSE 84 76  BUN 25 21  CREATININE 1.15* 1.12*  CALCIUM  9.5 9.5  TSH  --  1.14   Liver Function Tests: Recent Labs    02/18/24 0752 08/18/24 0800  AST 26 22  ALT 14 10  BILITOT 0.4 0.5  PROT 6.6 6.5   No results for input(s): LIPASE, AMYLASE in the last 8760 hours. No results for input(s):  AMMONIA in the last 8760 hours. CBC: Recent Labs    02/18/24 0752 08/18/24 0800  WBC 6.9 8.0  NEUTROABS 2,774 4,304  HGB 13.0 13.2  HCT 39.4 39.9  MCV 94.9 94.3  PLT 167 194   Lipid Panel: Recent Labs    02/18/24 0752 08/18/24 0800  CHOL 176 174  HDL 68 69  LDLCALC 92 88  TRIG 74 82  CHOLHDL 2.6 2.5    No results found for: HGBA1C  Procedures since last visit: No results found.  Assessment/Plan Assessment and Plan    Osteoporosis Managed with Prolia . Last DEXA in 2022. Beneficial to assess current bone density. - Ordered D[facility]A scan at [facility]. - Ensure adequate calcium  and vitamin D  intake.  Chronic kidney disease stage 3a Age-related decline. Creatinine slightly elevated but improved. No risk for progression. - Avoid NSAIDs. - Ensure adequate hydration.  Insomnia Difficulty falling asleep. Previously tried melatonin with grogginess. Discussed lower dose. - Try 1 mg melatonin or children's melatonin gummies.  Gastroesophageal reflux symptoms Heartburn with certain foods. Discussed Pepcid as alternative to Tums. - Use Pepcid as needed for heartburn.  Constipation Managed with Miralax . Bowel movements regular. - Continue Miralax  every other day.  Macular degeneration Concern about calcium  deposits. Advised calcium  intake should not exceed recommended levels. - Bring all vitamins and calcium  supplements to next appointment for review.  General Health Maintenance Discussed regular exercise for HDL. Lipid levels controlled. Prevnar 20 due in 2026 or 2027. Cardiologist follow-up needed. - Continue regular exercise. - Schedule follow-up with cardiologist. - Administer Prevnar 20 vaccine in 2026 or 2027.      Essential hypertension Norvasc   Hyperlipidemia, unspecified hyperlipidemia type Statin  Cervical stenosis of spine Last Xray in 2015 Moderate foraminal narrowing at C6-7 bilateral CAD Follows with Cardiology Aspirin  and Statin   Meningioma [facility]) S/p Surgery Does not need more follow ups by Neurosurgery   Memory deficit Last MMSE 30/30  Highly functional   . Allergic rhinitis, unspecified seasonality, unspecified trigger Flonase  PRn Labs/tests ordered:  * No order type specified * Next appt:  08/27/2024

## 2024-09-03 NOTE — Telephone Encounter (Signed)
 [employer] Verification Received: No Copay and PA required.   Yolanda [name] [phone]  for patient's Prolia  Authorization and spoke with Jeanice DEL. Prolia  was APPROVED 02/27/24-02/26/25 Auth#: [id number]

## 2024-09-05 ENCOUNTER — Other Ambulatory Visit: Payer: Self-pay

## 2024-09-05 ENCOUNTER — Other Ambulatory Visit (HOSPITAL_COMMUNITY): Payer: Self-pay

## 2024-09-15 ENCOUNTER — Ambulatory Visit: Payer: Self-pay

## 2024-09-15 DIAGNOSIS — M810 Age-related osteoporosis without current pathological fracture: Secondary | ICD-10-CM

## 2024-09-15 MED ORDER — DENOSUMAB 60 MG/ML ~~LOC~~ SOSY: 60.0000 mg | PREFILLED_SYRINGE | SUBCUTANEOUS | Status: AC

## 2024-09-15 NOTE — Progress Notes (Signed)
 Patient is in office today for a nurse visit for Prolia  Injection. Patient Injection was given in the  Right lower quad. abdomne. Patient tolerated injection well.

## 2024-09-16 ENCOUNTER — Ambulatory Visit

## 2024-09-17 ENCOUNTER — Non-Acute Institutional Stay: Admitting: Internal Medicine

## 2024-09-17 ENCOUNTER — Encounter: Payer: Self-pay | Admitting: Internal Medicine

## 2024-09-17 VITALS — BP 112/64 | HR 91 | Temp 100.4°F | Resp 18 | Ht 61.0 in | Wt 117.9 lb

## 2024-09-17 DIAGNOSIS — R6889 Other general symptoms and signs: Secondary | ICD-10-CM

## 2024-09-17 DIAGNOSIS — J101 Influenza due to other identified influenza virus with other respiratory manifestations: Secondary | ICD-10-CM | POA: Diagnosis not present

## 2024-09-17 DIAGNOSIS — Z1152 Encounter for screening for COVID-19: Secondary | ICD-10-CM

## 2024-09-17 LAB — POCT INFLUENZA A/B
Influenza A, POC: POSITIVE — AB
Influenza B, POC: NEGATIVE

## 2024-09-17 LAB — POC COVID19 BINAXNOW: SARS Coronavirus 2 Ag: NEGATIVE

## 2024-09-17 MED ORDER — OSELTAMIVIR PHOSPHATE 75 MG PO CAPS: 75.0000 mg | ORAL_CAPSULE | Freq: Two times a day (BID) | ORAL | 0 refills | Status: AC

## 2024-09-17 NOTE — Progress Notes (Signed)
 Location: [facility]ist of Service:  Clinic (12)  Provider:   Code Status:  Goals of Care:     02/13/2024   11:09 AM  Advanced Directives  Does Patient Have a Medical Advance Directive? Yes  Type of Estate Agent of [place];Living will;Out of facility DNR (pink MOST or yellow form)  Does patient want to make changes to medical advance directive? No - Patient declined  Copy of Healthcare Power of Attorney in Chart? Yes - validated most recent copy scanned in chart (See row information)     Chief Complaint  Patient presents with   Fever    Patient has a fever of 101 with a rough cough     HPI: Patient is a 89 y.o. female seen today for an acute visit for Fever Cough and Muscle aches  Lives [place] in [facility]  Came with c/o High grade temp Cough Weakness Shivering For past 2 days  Past Medical History:  Diagnosis Date   Allergy    seasonal   Anemia    Arthritis    Benign neoplasm of descending colon 10/22/2014   CAD (coronary artery disease)    LAD lession    Cancer (HCC)    skin   Cataract    removed bilaterally   Diverticulosis 11/00   Esophagitis, acute    Fuch's endothelial dystrophy 04/2009   Dr, Loras    Gastritis 11/00   GERD (gastroesophageal reflux disease) 11/00   Hemorrhoids 8/05   Hyperlipidemia    Hypertension    Lung nodule 1/09   NSVD (normal spontaneous vaginal delivery) 1967   female    Osteopenia    Osteoporosis    Thyroid  nodule 8/08    Past Surgical History:  Procedure Laterality Date   bilateral cataract surgery  06/2007   DR. Groat    bilateral cataract surgery  10/08   Dr. Octavia    cardiac cath 40-50%LAD  6/07   cardiac cath with angioplasty & stent replacement  4/98   COLONOSCOPY     CRANIOTOMY Left 05/13/2015   Procedure: Left frontotemporal Craniotomy for Meningioma resection;  Surgeon: Gerldine Maizes, MD;  Location: [facility];  Service: Neurosurgery;  Laterality: Left;   CT LUNG SCREENING   6/07   FACIAL COSMETIC SURGERY  7/96   Dr. Cherylann     MENISCUS REPAIR      Allergies[1]  Outpatient Encounter Medications as of 09/17/2024  Medication Sig   amLODipine  (NORVASC ) 5 MG tablet Take 1 tablet (5 mg total) by mouth daily.   aspirin  EC 81 MG tablet Take 81 mg by mouth daily.   Cholecalciferol (VITAMIN D3) 1000 UNITS CAPS Take 2,000 Units by mouth daily.   co-enzyme Q-10 30 MG capsule Take 1 capsule (30 mg total) by mouth daily.   colesevelam  (WELCHOL ) 625 MG tablet Take 3 tablets (1,875 mg total) by mouth 2 (two) times daily with a meal.   denosumab  (PROLIA ) 60 MG/ML SOSY injection Inject 60 mg into the skin every 6 (six) months. In prefilled syringes.   diclofenac  Sodium (VOLTAREN ) 1 % GEL Apply 2 g topically 4 (four) times daily.   estradiol  (ESTRACE  VAGINAL) 0.1 MG/GM vaginal cream Place 1 Applicatorful vaginally 3 (three) times a week. (Patient taking differently: Place 1 Applicatorful vaginally as needed.)   fluticasone  (FLONASE ) 50 MCG/ACT nasal spray Place 2 sprays into both nostrils daily.   Imiquimod (ALDARA EX) Apply topically. PATIENT HASN'T PICKED UP YET  L-Lysine 1000 MG TABS Take 1,000 mg by mouth daily.   loratadine  (CLARITIN ) 10 MG tablet Take 10 mg by mouth as needed for allergies.   Multiple Vitamin (MULTI-VITAMIN PO) Take 1 tablet by mouth daily.   Multiple Vitamins-Minerals (PRESERVISION AREDS 2 PO) Take 1 capsule by mouth 2 (two) times daily.   omega-3 acid ethyl esters (LOVAZA ) 1 g capsule Take 2 capsules (2 g total) by mouth 2 (two) times daily.   oseltamivir  (TAMIFLU ) 75 MG capsule Take 1 capsule (75 mg total) by mouth 2 (two) times daily.   polyethylene glycol powder (GLYCOLAX /MIRALAX ) powder Take 17 g by mouth daily as needed for moderate constipation.   pravastatin  (PRAVACHOL ) 80 MG tablet Take 1 tablet (80 mg total) by mouth daily.   Probiotic Product (PROBIOTIC PO) Take 1 capsule by mouth daily. While on antibiotic1   Facility-Administered  Encounter Medications as of 09/17/2024  Medication   [START ON 02/13/2025] denosumab  (PROLIA ) injection 60 mg    Review of Systems:  Review of Systems  Constitutional:  Positive for fever. Negative for activity change and appetite change.  HENT:  Positive for rhinorrhea.   Respiratory:  Positive for cough. Negative for shortness of breath.   Cardiovascular:  Negative for leg swelling.  Gastrointestinal:  Negative for constipation.  Genitourinary: Negative.   Musculoskeletal:  Negative for arthralgias, gait problem and myalgias.  Skin: Negative.   Neurological:  Positive for weakness. Negative for dizziness.  Psychiatric/Behavioral:  Negative for confusion, dysphoric mood and sleep disturbance.     Health Maintenance  Topic Date Due   Bone Density Scan  09/09/2023   Medicare Annual Wellness (AWV)  01/22/2024   Mammogram  11/18/2024   COVID-19 Vaccine (9 - Mixed Product risk 2025-26 season) 01/12/2025   DTaP/Tdap/Td (2 - Td or Tdap) 08/03/2031   Pneumococcal Vaccine: 50+ Years  Completed   Influenza Vaccine  Completed   Zoster Vaccines- Shingrix  Completed   Meningococcal B Vaccine  Aged Out    Physical Exam: Vitals:   09/17/24 1343  BP: 112/64  Pulse: 91  Resp: 18  Temp: (!) 100.4 F (38 C)  SpO2: 99%  Weight: 117 lb 14.4 oz (53.5 kg)  Height: 5' 1 (1.549 m)   Body mass index is 22.28 kg/m. Physical Exam Vitals reviewed.  Constitutional:      Appearance: Normal appearance.  HENT:     Head: Normocephalic.     Nose: Congestion present.     Mouth/Throat:     Mouth: Mucous membranes are moist.     Pharynx: Oropharynx is clear.  Eyes:     Pupils: Pupils are equal, round, and reactive to light.  Cardiovascular:     Rate and Rhythm: Normal rate and regular rhythm.     Pulses: Normal pulses.     Heart sounds: Normal heart sounds. No murmur heard. Pulmonary:     Effort: Pulmonary effort is normal.     Breath sounds: Normal breath sounds.  Abdominal:      General: Abdomen is flat. Bowel sounds are normal.     Palpations: Abdomen is soft.  Musculoskeletal:        General: No swelling.     Cervical back: Neck supple.  Skin:    General: Skin is warm.  Neurological:     General: No focal deficit present.     Mental Status: She is alert and oriented to person, place, and time.  Psychiatric:        Mood and Affect: Mood  normal.        Thought Content: Thought content normal.     Labs reviewed: Basic Metabolic Panel: Recent Labs    02/18/24 0752 08/18/24 0800  NA 142 141  K 4.3 4.3  CL 107 105  CO2 28 29  GLUCOSE 84 76  BUN 25 21  CREATININE 1.15* 1.12*  CALCIUM  9.5 9.5  TSH  --  1.14   Liver Function Tests: Recent Labs    02/18/24 0752 08/18/24 0800  AST 26 22  ALT 14 10  BILITOT 0.4 0.5  PROT 6.6 6.5   No results for input(s): LIPASE, AMYLASE in the last 8760 hours. No results for input(s): AMMONIA in the last 8760 hours. CBC: Recent Labs    02/18/24 0752 08/18/24 0800  WBC 6.9 8.0  NEUTROABS 2,774 4,304  HGB 13.0 13.2  HCT 39.4 39.9  MCV 94.9 94.3  PLT 167 194   Lipid Panel: Recent Labs    02/18/24 0752 08/18/24 0800  CHOL 176 174  HDL 68 69  LDLCALC 92 88  TRIG 74 82  CHOLHDL 2.6 2.5   No results found for: HGBA1C  Procedures since last visit: No results found.  Assessment/Plan 1. Flu-like symptoms (Primary)  - POCT Influenza A/B  2. Encounter for screening for COVID-19  - POC COVID-19 BinaxNow  3. Influenza A H1N1 infection Started on Tamiflu  75 mg BID for 5 days Drink Fluids Keep Mask in the community till symptoms better Tylenol  PRN for Fever  Take rest  Mucinex  PRN for cough    Labs/tests ordered:  * No order type specified * Next appt:  02/11/2025     [1]  Allergies Allergen Reactions   Sulfonamide Derivatives    K+ Care [Potassium Chloride ] Diarrhea   Lipitor [Atorvastatin Calcium ]     Myalgias     Sulfa Antibiotics Rash    Fever blisters

## 2024-09-22 ENCOUNTER — Other Ambulatory Visit: Payer: Self-pay | Admitting: Internal Medicine

## 2024-09-22 ENCOUNTER — Ambulatory Visit: Payer: Self-pay

## 2024-09-22 MED ORDER — PREDNISONE 10 MG PO TABS: 10.0000 mg | ORAL_TABLET | Freq: Every day | ORAL | 0 refills | Status: DC

## 2024-09-22 MED ORDER — PREDNISONE 10 MG PO TABS: 10.0000 mg | ORAL_TABLET | Freq: Every day | ORAL | 0 refills | Status: AC

## 2024-09-22 NOTE — Telephone Encounter (Signed)
 D/W the Patient she is having Cough No Fever or chest pain No SOB Just some mucus coming up She refused Chest Xray Will Try Prednisone  Taper  If symptoms get worse she will call back ot go to ED

## 2024-09-22 NOTE — Telephone Encounter (Signed)
 FYI Only or Action Required?: Action required by provider: update on patient condition.  Patient was last seen in primary care on 09/17/2024 by Charlanne Fredia CROME, MD.  Called Nurse Triage reporting Chest Pain.  Symptoms began a week ago.  Interventions attempted: Prescription medications: Tamiflu .  Triage Disposition: Call EMS 911 Now  Patient/caregiver understands and will follow disposition?: No, wishes to speak with PCP                   Copied from CRM #[id number]. Topic: Clinical - Red Word Triage >> Sep 22, 2024 11:35 AM Zane F wrote: Red Word that prompted transfer to Nurse Triage:   Concern: Increased congestion in chest; discolored mucous ( green)   Symptoms:  Coughing   When did the symptoms start?: 09/17/2024   What have you done to aid in the concern ? Have you taken anything to assist with the matter?: Yes, but ran out today   If so, what did you take?: oseltamivir  (TAMIFLU ) 75 MG capsule    Wanted to let you know I will be transferring you to further discuss your concern. Please be advised the nurse can assist with scheduling. Reason for Disposition  [1] Chest pain lasts > 5 minutes AND [2] age > 61  Answer Assessment - Initial Assessment Questions This RN recommends pt goes to ED but pt refused. This RN notified [facility] of pt refusal of ED disposition. Pt needs a call back today at [phone].  Symptoms: Chest ache all the time, no idea when it started, in the middle of chest, 5-6/10 pain, feels like pressure Increased congestion in chest Cough- difficulty getting phlegm up Headache  Denies: Fever  SOB  Onset: Last week, pt tested positive for flu last week, took Tamiflu   Protocols used: Chest Pain-A-AH

## 2025-02-11 ENCOUNTER — Encounter: Admitting: Internal Medicine

## 2025-03-19 ENCOUNTER — Ambulatory Visit
